# Patient Record
Sex: Male | Born: 1935 | Race: White | Hispanic: No | Marital: Married | Smoking: Former smoker
Health system: Southern US, Community
[De-identification: ages and names within clinical notes are randomized; demographics above are authoritative.]

## PROBLEM LIST (undated history)

## (undated) DIAGNOSIS — C4492 Squamous cell carcinoma of skin, unspecified: Secondary | ICD-10-CM

## (undated) DIAGNOSIS — K219 Gastro-esophageal reflux disease without esophagitis: Secondary | ICD-10-CM

## (undated) DIAGNOSIS — E785 Hyperlipidemia, unspecified: Secondary | ICD-10-CM

## (undated) DIAGNOSIS — Z5181 Encounter for therapeutic drug level monitoring: Secondary | ICD-10-CM

## (undated) DIAGNOSIS — M1710 Unilateral primary osteoarthritis, unspecified knee: Secondary | ICD-10-CM

## (undated) DIAGNOSIS — Z8739 Personal history of other diseases of the musculoskeletal system and connective tissue: Secondary | ICD-10-CM

## (undated) DIAGNOSIS — E669 Obesity, unspecified: Secondary | ICD-10-CM

## (undated) DIAGNOSIS — I4819 Other persistent atrial fibrillation: Secondary | ICD-10-CM

## (undated) DIAGNOSIS — I77810 Thoracic aortic ectasia: Secondary | ICD-10-CM

## (undated) DIAGNOSIS — G4733 Obstructive sleep apnea (adult) (pediatric): Secondary | ICD-10-CM

## (undated) DIAGNOSIS — I2510 Atherosclerotic heart disease of native coronary artery without angina pectoris: Secondary | ICD-10-CM

## (undated) DIAGNOSIS — C61 Malignant neoplasm of prostate: Secondary | ICD-10-CM

## (undated) DIAGNOSIS — M179 Osteoarthritis of knee, unspecified: Secondary | ICD-10-CM

## (undated) DIAGNOSIS — I10 Essential (primary) hypertension: Secondary | ICD-10-CM

## (undated) DIAGNOSIS — I350 Nonrheumatic aortic (valve) stenosis: Secondary | ICD-10-CM

## (undated) DIAGNOSIS — Z79899 Other long term (current) drug therapy: Secondary | ICD-10-CM

## (undated) DIAGNOSIS — C4491 Basal cell carcinoma of skin, unspecified: Secondary | ICD-10-CM

## (undated) HISTORY — DX: Hyperlipidemia, unspecified: E78.5

## (undated) HISTORY — DX: Atherosclerotic heart disease of native coronary artery without angina pectoris: I25.10

## (undated) HISTORY — PX: CORONARY ANGIOPLASTY: SHX604

## (undated) HISTORY — DX: Gastro-esophageal reflux disease without esophagitis: K21.9

## (undated) HISTORY — DX: Thoracic aortic ectasia: I77.810

## (undated) HISTORY — DX: Malignant neoplasm of prostate: C61

## (undated) HISTORY — DX: Nonrheumatic aortic (valve) stenosis: I35.0

## (undated) HISTORY — DX: Osteoarthritis of knee, unspecified: M17.9

## (undated) HISTORY — PX: JOINT REPLACEMENT: SHX530

## (undated) HISTORY — DX: Obstructive sleep apnea (adult) (pediatric): G47.33

## (undated) HISTORY — DX: Essential (primary) hypertension: I10

## (undated) HISTORY — DX: Obesity, unspecified: E66.9

## (undated) HISTORY — DX: Personal history of other diseases of the musculoskeletal system and connective tissue: Z87.39

## (undated) HISTORY — PX: CARDIAC CATHETERIZATION: SHX172

## (undated) HISTORY — DX: Unilateral primary osteoarthritis, unspecified knee: M17.10

---
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
## 1997-11-06 ENCOUNTER — Emergency Department (HOSPITAL_COMMUNITY): Admission: EM | Admit: 1997-11-06 | Discharge: 1997-11-06 | Payer: Self-pay | Admitting: Emergency Medicine

## 1997-11-06 ENCOUNTER — Encounter: Payer: Self-pay | Admitting: Emergency Medicine

## 2001-05-19 DIAGNOSIS — C4491 Basal cell carcinoma of skin, unspecified: Secondary | ICD-10-CM

## 2001-05-19 HISTORY — DX: Basal cell carcinoma of skin, unspecified: C44.91

## 2001-07-03 ENCOUNTER — Ambulatory Visit (HOSPITAL_COMMUNITY): Admission: RE | Admit: 2001-07-03 | Discharge: 2001-07-04 | Payer: Self-pay | Admitting: Cardiology

## 2002-04-22 ENCOUNTER — Ambulatory Visit (HOSPITAL_COMMUNITY): Admission: RE | Admit: 2002-04-22 | Discharge: 2002-04-22 | Payer: Self-pay | Admitting: Gastroenterology

## 2002-10-11 ENCOUNTER — Encounter: Admission: RE | Admit: 2002-10-11 | Discharge: 2002-10-11 | Payer: Self-pay | Admitting: Internal Medicine

## 2002-10-11 ENCOUNTER — Encounter: Payer: Self-pay | Admitting: Internal Medicine

## 2003-03-09 ENCOUNTER — Ambulatory Visit (HOSPITAL_COMMUNITY): Admission: RE | Admit: 2003-03-09 | Discharge: 2003-03-10 | Payer: Self-pay | Admitting: Cardiology

## 2003-03-18 ENCOUNTER — Encounter (HOSPITAL_COMMUNITY): Admission: RE | Admit: 2003-03-18 | Discharge: 2003-06-16 | Payer: Self-pay | Admitting: Cardiology

## 2003-03-21 ENCOUNTER — Encounter: Admission: RE | Admit: 2003-03-21 | Discharge: 2003-03-21 | Payer: Self-pay | Admitting: Internal Medicine

## 2003-06-02 ENCOUNTER — Inpatient Hospital Stay (HOSPITAL_COMMUNITY): Admission: EM | Admit: 2003-06-02 | Discharge: 2003-06-06 | Payer: Self-pay | Admitting: Emergency Medicine

## 2003-06-17 ENCOUNTER — Encounter (HOSPITAL_COMMUNITY): Admission: RE | Admit: 2003-06-17 | Discharge: 2003-09-15 | Payer: Self-pay | Admitting: Cardiology

## 2004-06-01 ENCOUNTER — Ambulatory Visit: Payer: Self-pay | Admitting: Family Medicine

## 2004-08-24 ENCOUNTER — Ambulatory Visit: Payer: Self-pay | Admitting: Internal Medicine

## 2004-11-15 ENCOUNTER — Ambulatory Visit: Payer: Self-pay | Admitting: Family Medicine

## 2004-11-18 ENCOUNTER — Emergency Department (HOSPITAL_COMMUNITY): Admission: EM | Admit: 2004-11-18 | Discharge: 2004-11-18 | Payer: Self-pay | Admitting: *Deleted

## 2004-11-19 ENCOUNTER — Ambulatory Visit: Admission: RE | Admit: 2004-11-19 | Discharge: 2004-12-03 | Payer: Self-pay | Admitting: Radiation Oncology

## 2004-12-24 ENCOUNTER — Ambulatory Visit: Payer: Self-pay | Admitting: Family Medicine

## 2005-01-03 ENCOUNTER — Ambulatory Visit: Admission: RE | Admit: 2005-01-03 | Discharge: 2005-04-03 | Payer: Self-pay | Admitting: Urology

## 2005-01-03 ENCOUNTER — Encounter: Admission: RE | Admit: 2005-01-03 | Discharge: 2005-01-03 | Payer: Self-pay | Admitting: Urology

## 2005-02-18 ENCOUNTER — Ambulatory Visit (HOSPITAL_BASED_OUTPATIENT_CLINIC_OR_DEPARTMENT_OTHER): Admission: RE | Admit: 2005-02-18 | Discharge: 2005-02-18 | Payer: Self-pay | Admitting: Urology

## 2005-03-14 ENCOUNTER — Encounter: Payer: Self-pay | Admitting: Internal Medicine

## 2005-06-18 ENCOUNTER — Encounter: Payer: Self-pay | Admitting: Internal Medicine

## 2005-08-10 ENCOUNTER — Encounter: Admission: RE | Admit: 2005-08-10 | Discharge: 2005-08-10 | Payer: Self-pay | Admitting: Family Medicine

## 2006-01-02 ENCOUNTER — Ambulatory Visit: Payer: Self-pay | Admitting: Internal Medicine

## 2006-10-27 DIAGNOSIS — Z8546 Personal history of malignant neoplasm of prostate: Secondary | ICD-10-CM | POA: Insufficient documentation

## 2006-10-27 DIAGNOSIS — R51 Headache: Secondary | ICD-10-CM | POA: Insufficient documentation

## 2006-10-27 DIAGNOSIS — R519 Headache, unspecified: Secondary | ICD-10-CM | POA: Insufficient documentation

## 2008-06-22 DIAGNOSIS — C4492 Squamous cell carcinoma of skin, unspecified: Secondary | ICD-10-CM

## 2008-06-22 HISTORY — DX: Squamous cell carcinoma of skin, unspecified: C44.92

## 2008-07-20 ENCOUNTER — Emergency Department (HOSPITAL_BASED_OUTPATIENT_CLINIC_OR_DEPARTMENT_OTHER): Admission: EM | Admit: 2008-07-20 | Discharge: 2008-07-20 | Payer: Self-pay | Admitting: Emergency Medicine

## 2008-07-20 ENCOUNTER — Ambulatory Visit: Payer: Self-pay | Admitting: Diagnostic Radiology

## 2008-11-22 ENCOUNTER — Encounter: Payer: Self-pay | Admitting: Internal Medicine

## 2008-12-21 ENCOUNTER — Ambulatory Visit: Payer: Self-pay | Admitting: Internal Medicine

## 2008-12-28 ENCOUNTER — Telehealth: Payer: Self-pay | Admitting: Internal Medicine

## 2009-01-16 ENCOUNTER — Ambulatory Visit: Payer: Self-pay | Admitting: Internal Medicine

## 2009-01-16 DIAGNOSIS — I2510 Atherosclerotic heart disease of native coronary artery without angina pectoris: Secondary | ICD-10-CM | POA: Insufficient documentation

## 2009-01-25 ENCOUNTER — Telehealth: Payer: Self-pay | Admitting: Internal Medicine

## 2009-02-20 ENCOUNTER — Inpatient Hospital Stay (HOSPITAL_COMMUNITY): Admission: RE | Admit: 2009-02-20 | Discharge: 2009-02-23 | Payer: Self-pay | Admitting: Orthopedic Surgery

## 2009-02-22 ENCOUNTER — Ambulatory Visit: Payer: Self-pay | Admitting: Oncology

## 2009-02-24 ENCOUNTER — Ambulatory Visit: Payer: Self-pay | Admitting: Oncology

## 2009-03-01 LAB — CBC WITH DIFFERENTIAL (CANCER CENTER ONLY)
BASO#: 0 10*3/uL (ref 0.0–0.2)
BASO%: 0.5 % (ref 0.0–2.0)
EOS%: 4.1 % (ref 0.0–7.0)
Eosinophils Absolute: 0.3 10*3/uL (ref 0.0–0.5)
HCT: 31.1 % — ABNORMAL LOW (ref 38.7–49.9)
HGB: 10.4 g/dL — ABNORMAL LOW (ref 13.0–17.1)
LYMPH#: 1.5 10*3/uL (ref 0.9–3.3)
LYMPH%: 20.8 % (ref 14.0–48.0)
MCH: 31 pg (ref 28.0–33.4)
MCHC: 33.5 g/dL (ref 32.0–35.9)
MCV: 93 fL (ref 82–98)
MONO#: 0.7 10*3/uL (ref 0.1–0.9)
MONO%: 9.8 % (ref 0.0–13.0)
NEUT#: 4.8 10*3/uL (ref 1.5–6.5)
NEUT%: 64.8 % (ref 40.0–80.0)
Platelets: 395 10*3/uL (ref 145–400)
RBC: 3.36 10*6/uL — ABNORMAL LOW (ref 4.20–5.70)
RDW: 12.3 % (ref 10.5–14.6)
WBC: 7.4 10*3/uL (ref 4.0–10.0)

## 2009-03-01 LAB — CMP (CANCER CENTER ONLY)
ALT(SGPT): 65 U/L — ABNORMAL HIGH (ref 10–47)
AST: 64 U/L — ABNORMAL HIGH (ref 11–38)
Albumin: 3.1 g/dL — ABNORMAL LOW (ref 3.3–5.5)
Alkaline Phosphatase: 82 U/L (ref 26–84)
BUN, Bld: 16 mg/dL (ref 7–22)
CO2: 27 mEq/L (ref 18–33)
Calcium: 9.1 mg/dL (ref 8.0–10.3)
Chloride: 100 mEq/L (ref 98–108)
Creat: 1.1 mg/dl (ref 0.6–1.2)
Glucose, Bld: 121 mg/dL — ABNORMAL HIGH (ref 73–118)
Potassium: 4.3 mEq/L (ref 3.3–4.7)
Sodium: 136 mEq/L (ref 128–145)
Total Bilirubin: 1 mg/dl (ref 0.20–1.60)
Total Protein: 7.1 g/dL (ref 6.4–8.1)

## 2009-03-01 LAB — MORPHOLOGY - CHCC SATELLITE: PLT EST ~~LOC~~: ADEQUATE

## 2009-03-01 LAB — PROTIME-INR (CHCC SATELLITE)
INR: 1.1 — ABNORMAL LOW (ref 2.0–3.5)
Protime: 13.2 Seconds (ref 10.6–13.4)

## 2009-04-25 ENCOUNTER — Ambulatory Visit: Payer: Self-pay | Admitting: Oncology

## 2009-07-04 ENCOUNTER — Ambulatory Visit (HOSPITAL_BASED_OUTPATIENT_CLINIC_OR_DEPARTMENT_OTHER): Admission: RE | Admit: 2009-07-04 | Discharge: 2009-07-04 | Payer: Self-pay | Admitting: Orthopedic Surgery

## 2009-11-21 ENCOUNTER — Encounter: Admission: RE | Admit: 2009-11-21 | Discharge: 2009-11-21 | Payer: Self-pay | Admitting: Internal Medicine

## 2010-02-17 ENCOUNTER — Encounter: Payer: Self-pay | Admitting: Internal Medicine

## 2010-02-27 NOTE — Letter (Signed)
Summary: [facility]  [facility]   Imported By: Sherian Rein 12/06/2008 14:15:39  _____________________________________________________________________  External Attachment:    Type:   Image     Comment:   External Document

## 2010-02-27 NOTE — Miscellaneous (Signed)
Summary: Orders Update pft charges  Clinical Lists Changes  Orders: Added new Service order of Carbon Monoxide diffusing w/capacity (94720) - Signed Added new Service order of Lung Volumes (94240) - Signed Added new Service order of Spirometry (Pre & Post) (94060) - Signed 

## 2010-02-27 NOTE — Assessment & Plan Note (Signed)
Summary: SIX MIN WALK-PULM STRESS TEST  Nurse Visit   Vital Signs:  Patient profile:   77 year old male Pulse rate:   52 / minute BP sitting:   136 / 70  Medications Prior to Update: 1)  Zetia 10 Mg  Tabs (Ezetimibe) .... Once Daily 2)  Bayer Aspirin 325 Mg  Tabs (Aspirin) .... Once Daily 3)  Metoprolol Succinate 25 Mg  Tb24 (Metoprolol Succinate) .... Two Times A Day 4)  Crestor 5 Mg Tabs (Rosuvastatin Calcium) .... Once Daily 5)  Ambien 10 Mg  Tabs (Zolpidem Tartrate) .... As Needed 6)  Flomax 0.4 Mg Caps (Tamsulosin Hcl) .... Once Daily 7)  Multivitamins  Tabs (Multiple Vitamin) .... Once Daily 8)  Fish Oil 1000 Mg Caps (Omega-3 Fatty Acids) .... Once Daily 9)  Glucosamine Hcl 1000 Mg Tabs (Glucosamine Hcl) .... Once Daily 10)  Cpap 9-10 [facility]  Allergies: 1)  ! Prednisone  Orders Added: 1)  Pulmonary Stress (6 min walk) [94620]   Six Minute Walk Test Medications taken before test(dose and time): 1)  Zetia 10 Mg  Tabs (Ezetimibe) .... Once Daily 2)  Bayer Aspirin 325 Mg  Tabs (Aspirin) .... Once Daily 3)  Metoprolol Succinate 25 Mg  Tb24 (Metoprolol Succinate) .... Two Times A Day 4)  Crestor 5 Mg Tabs (Rosuvastatin Calcium) .... Once Daily 6)  Flomax 0.4 Mg Caps (Tamsulosin Hcl) .... Once Daily 7)  Multivitamins  Tabs (Multiple Vitamin) .... Once Daily 8)  Fish Oil 1000 Mg Caps (Omega-3 Fatty Acids) .... Once Daily 9)  Glucosamine Hcl 1000 Mg Tabs (Glucosamine Hcl) .... Once Daily  Supplemental oxygen during the test: No  Lap counter(place a tick mark inside a square for each lap completed) lap 1 complete  lap 2 complete   lap 3 complete   lap 4 complete  lap 5 complete  lap 6 complete  lap 7 complete    Baseline  BP sitting: 136/ 70 Heart rate: 52 Dyspnea ( Borg scale) 0 Fatigue (Borg scale) 0 SPO2 96  End Of Test  BP sitting: 140/ 72 Heart rate: 61 Dyspnea ( Borg scale) 0 Fatigue (Borg scale) 0 SPO2 98  2 Minutes post  BP sitting:  138/ 70 Heart rate: 56 SPO2 97  Stopped or paused before six minutes? No Other symptoms at end of exercise: Leg pain  Interpretation: Number of laps  7 X 48 meters =   336 meters+ final partial lap: 38 meters =    374 meters   Total distance walked in six minutes: 374 meters  Tech ID: Tivis Ringer (January 16, 2009 10:38 AM) Jeremy Johann Comments pt completed test w/ 0 rest breaks and 1 complaint: knee pain (both legs). pt states this is normal for him while walking.

## 2010-02-27 NOTE — Assessment & Plan Note (Signed)
Summary: pulm w/u- low 02 stats/apc   Copy to:  Dr.  Thurston Hole Primary Provider/Referring Provider:  Dr. Kirby Funk  CC:  Pulmonary Consult.  Surgery clearance for left knee replacement.  .  History of Present Illness: 01/02/06: PROBLEM:  77 year old gentleman, self referred, to establish for sleep medicine management, because of sleep apnea.   HISTORY:  He has had sleep apnea for over 15 years. More recently, he had begun having headaches and there was consideration these might be due to inadequate control of his sleep apnea. He had a diagnostic NPSG at [facility], which recorded an AHI of 6.98 per hour, which is minimal (normal range is 0-5 per hour). Oxygen desaturation to 79%, with loud snoring. He then had CPAP titration on 06/18/2005, and has been using CPAP since then. Titration was to 5 CWP, but the [facility] technician taught him to adjust the machine himself, which is ordinarily contraindicated. He has reset it to 7, and he says it works great. Headaches are gone, and he says his wife is back sleeping in the same bedroom with him, now that his snoring is better controlled. He feels he sleeps well, rarely needing supplemental fragment of an Ambien tablet to return to sleep if he wakes during the night. Bedtime is between 10- 11pm, estimated 5 minute sleep latency and waking briefly 2-3 times during the night, before final waking at 6am.   December 21, 2008- Hx OSA Now 76 yoM last here in 2007 qnd referred back by Dr Thurston Hole for preop assessment.  OSA- Does well, fully compliant with CPAP 9-10/ [facility]. He would like to come back here for this problem. Uses all night every night and it helps. NEW PROBLEM /REFERRAL: Preop pulmonary evaluation because he found his O2 sat at times running lower than he is used to. Not aware of lung disease. Goes to gym where sat has been 98% exercise. Was 93-94% at Dr Sherene Sires and they suggested check. He denies change in symptoms, cough,  wheeze, palpitation, chest pain, leg edema or obvious dyspnea. Had flu vax.   Current Medications (verified): 1)  Zetia 10 Mg  Tabs (Ezetimibe) .... Once Daily 2)  Bayer Aspirin 325 Mg  Tabs (Aspirin) .... Once Daily 3)  Metoprolol Succinate 25 Mg  Tb24 (Metoprolol Succinate) .... Two Times A Day 4)  Crestor 5 Mg Tabs (Rosuvastatin Calcium) .... Once Daily 5)  Ambien 10 Mg  Tabs (Zolpidem Tartrate) .... As Needed 6)  Flomax 0.4 Mg Caps (Tamsulosin Hcl) .... Once Daily 7)  Multivitamins  Tabs (Multiple Vitamin) .... Once Daily 8)  Fish Oil 1000 Mg Caps (Omega-3 Fatty Acids) .... Once Daily 9)  Glucosamine Hcl 1000 Mg Tabs (Glucosamine Hcl) .... Once Daily  Allergies (verified): 1)  ! Prednisone  Past History:  Family History: Last updated: 12/21/2008 Family History of PVD Family History of CAD Male 1st degree relative <50 Family History Diabetes 1st degree relative Family History Osteoporosis Family History Other cancer Family History of Cardiovascular disorder PVD-father  Social History: Last updated: 12/21/2008 Retired from [employer] Married Former Smoker. Quit in 1960's.  1ppd x61yrs.   Alcohol use-yes 2 children  Risk Factors: Smoking Status: quit (10/27/2006)  Past Medical History:  Obstrucitive SLEEP APNEA (ICD-780.57) PROSTATE CANCER, HX OF (ICD-V10.46) FAMILY HISTORY OSTEOPOROSIS (ICD-V17.8) FAMILY HISTORY DIABETES 1ST DEGREE RELATIVE (ICD-V18.0) FAMILY HISTORY OF CAD MALE 1ST DEGREE RELATIVE <50 (ICD-V17.3) HEADACHE (ICD-784.0) Hx arrhythmia- Dr Mayford Knife  Past Surgical History: Hand Surgery Cordis stent  06/2001 Taxus stent 03/2003 Radioactive Seed implant for prostate ca 01/2005  Family History: Family History of PVD Family History of CAD Male 1st degree relative <50 Family History Diabetes 1st degree relative Family History Osteoporosis Family History Other cancer Family History of Cardiovascular disorder PVD-father  Social  History: Retired from [employer] Married Former Smoker. Quit in 1960's.  1ppd x16yrs.   Alcohol use-yes 2 children  Review of Systems      See HPI       The patient complains of irregular heartbeats and acid heartburn.  The patient denies shortness of breath with activity, shortness of breath at rest, productive cough, non-productive cough, coughing up blood, chest pain, indigestion, loss of appetite, weight change, abdominal pain, difficulty swallowing, sore throat, tooth/dental problems, headaches, nasal congestion/difficulty breathing through nose, sneezing, itching, ear ache, anxiety, depression, hand/feet swelling, joint stiffness or pain, rash, change in color of mucus, and fever.    Vital Signs:  Patient profile:   77 year old male Height:      74 inches Weight:      249 pounds BMI:     32.09 O2 Sat:      92 % on Room air Pulse rate:   56 / minute BP sitting:   140 / 72  (right arm) Cuff size:   large  Vitals Entered By: Gweneth Dimitri RN (December 21, 2008 10:25 AM)  O2 Flow:  Room air CC: Pulmonary Consult.  Surgery clearance for left knee replacement.   Comments Medications reviewed with patient Gweneth Dimitri RN  December 21, 2008 10:26 AM    Physical Exam  Additional Exam:  General: A/Ox3; pleasant and cooperative, NAD, alert/ bright, medium build SKIN: no rash, lesions NODES: no lymphadenopathy HEENT: [place]/AT, EOM- WNL, Conjuctivae- clear, PERRLA, TM-WNL, Nose- clear, Throat- clear and wnl, [place]  II-III NECK: Supple w/ fair ROM, JVD- none, normal carotid impulses w/o bruits Thyroid- normal to palpation CHEST: Clear to P&A, unlabored, no rales, wheeze, dullness or cough HEART: RRR- occasional dropped beat, no m/g/r heard ABDOMEN: Soft and nl; nml bowel sounds; no organomegaly or masses noted ZOX:WRUE, nl pulses, no edema, cyanosis or clubbing NEURO: Grossly intact to observation      Impression & Recommendations:  Problem # 1:   DYSPNEA (ICD-786.05)  I think he is in normal range and I don't anticipate any pulmonary problem with surgery. For completeness due to his pending surgery, I did suggest a PFT.  Problem # 2:  SLEEP APNEA (ICD-780.57) I had seen him originally for sleep apnea. He has had excellent compliance and control with cpap. He asks I resume following for this problem. I will find his old sleep study.  Medications Added to Medication List This Visit: 1)  Zetia 10 Mg Tabs (Ezetimibe) .... Once daily 2)  Bayer Aspirin 325 Mg Tabs (Aspirin) .... Once daily 3)  Metoprolol Succinate 25 Mg Tb24 (Metoprolol succinate) .... Two times a day 4)  Crestor 5 Mg Tabs (Rosuvastatin calcium) .... Once daily 5)  Ambien 10 Mg Tabs (Zolpidem tartrate) .... As needed 6)  Flomax 0.4 Mg Caps (Tamsulosin hcl) .... Once daily 7)  Multivitamins Tabs (Multiple vitamin) .... Once daily 8)  Fish Oil 1000 Mg Caps (Omega-3 fatty acids) .... Once daily 9)  Glucosamine Hcl 1000 Mg Tabs (Glucosamine hcl) .... Once daily 10)  Cpap 9-10 [facility]   Other Orders: Consultation Level III (45409)  Patient Instructions: 1)  Please schedule a follow-up appointment in 3 weeks.  2)  Schedule PFT 3)  Schedule 6 MWT

## 2010-02-27 NOTE — Progress Notes (Signed)
Summary: sleep study needed  Phone Note Other Incoming   Summary of Call: Get original NPSG and current cpap pressure. Initial call taken by: cyoug  Follow-up for Phone Call        [facility] and they have requested original sleep study from 2007 from [facility] and should have it by Wed. 01/04/09. They will fax to [phone].  Called [facility] ([place]) and they state that pt is on a pressure of 5 cwp. Pt states that pressure is on 10 cwp. Dr. Maple Hudson advised. Alfonso Ramus  January 02, 2009 2:51 PM   Additional Follow-up for Phone Call Additional follow up Details #1::        We will do best to retitrate if there is that much discrepancy.  Additional Follow-up by: Waymon Budge MD,  January 02, 2009 8:14 PM    Additional Follow-up for Phone Call Additional follow up Details #2::    Order faxed to [facility] in [place]. Pt is aware. Original Sleep Study obtained from [facility] and given to Dr. Maple Hudson. Rhonda Cobb  January 04, 2009 12:02 PM

## 2010-02-27 NOTE — Assessment & Plan Note (Signed)
Summary: 3 weeks/apc   Copy to:  Dr.  Thurston Hole Primary Provider/Referring Provider:  Dr. Kirby Funk  CC:  follow up visit-reveiw PFT and .Marland Kitchen  History of Present Illness: December 21, 2008- Hx OSA Now 41 yoM last here in 2007 qnd referred back by Dr Thurston Hole for preop assessment.  OSA- Does well, fully compliant with CPAP 9-10/ [facility]. He would like to come back here for this problem. Uses all night every night and it helps. NEW PROBLEM /REFERRAL: Preop pulmonary evaluation because he found his O2 sat at times running lower than he is used to. Not aware of lung disease. Goes to gym where sat has been 98% exercise. Was 93-94% at Dr Sherene Sires and they suggested check. He denies change in symptoms, cough, wheeze, palpitation, chest pain, leg edema or obvious dyspnea. Had flu vax.  January 16, 2009-, Hypoxia/ preop  Here for PFT and review, after dropping sauration a little during exercise at orthopedic office. No changes since last here. Mentions occasional sniffle "from allergy"; takes Claritin if needed. PFT- mild reversible small airway obstruction/ asthma pattern. - 96%, 98%, 97%, .  OSA- [facility] has him doing autotitration for pressure check as planned, with result pending. Had flu shot.   Current Medications (verified): 1)  Zetia 10 Mg  Tabs (Ezetimibe) .... Once Daily 2)  Bayer Aspirin 325 Mg  Tabs (Aspirin) .... Once Daily 3)  Metoprolol Succinate 25 Mg  Tb24 (Metoprolol Succinate) .... Two Times A Day 4)  Crestor 5 Mg Tabs (Rosuvastatin Calcium) .... Once Daily 5)  Ambien 10 Mg  Tabs (Zolpidem Tartrate) .... As Needed 6)  Flomax 0.4 Mg Caps (Tamsulosin Hcl) .... Once Daily 7)  Multivitamins  Tabs (Multiple Vitamin) .... Once Daily 8)  Fish Oil 1000 Mg Caps (Omega-3 Fatty Acids) .... Once Daily 9)  Cpap 9-10 [facility]  Allergies (verified): 1)  ! Prednisone  Past History:  Past Medical History: Last updated: 12/21/2008  Obstrucitive SLEEP  APNEA (ICD-780.57) PROSTATE CANCER, HX OF (ICD-V10.46) FAMILY HISTORY OSTEOPOROSIS (ICD-V17.8) FAMILY HISTORY DIABETES 1ST DEGREE RELATIVE (ICD-V18.0) FAMILY HISTORY OF CAD MALE 1ST DEGREE RELATIVE <50 (ICD-V17.3) HEADACHE (ICD-784.0) Hx arrhythmia- Dr Mayford Knife  Past Surgical History: Last updated: 12/21/2008 Hand Surgery Cordis stent 06/2001 Taxus stent 03/2003 Radioactive Seed implant for prostate ca 01/2005  Family History: Last updated: 12/21/2008 Family History of PVD Family History of CAD Male 1st degree relative <50 Family History Diabetes 1st degree relative Family History Osteoporosis Family History Other cancer Family History of Cardiovascular disorder PVD-father  Social History: Last updated: 12/21/2008 Retired from [employer] Married Former Smoker. Quit in 1960's.  1ppd x75yrs.   Alcohol use-yes 2 children  Risk Factors: Smoking Status: quit (10/27/2006)  Review of Systems      See HPI  The patient denies anorexia, fever, weight loss, weight gain, vision loss, decreased hearing, hoarseness, chest pain, syncope, dyspnea on exertion, peripheral edema, prolonged cough, headaches, hemoptysis, and severe indigestion/heartburn.    Vital Signs:  Patient profile:   77 year old male Height:      74 inches Weight:      242 pounds BMI:     31.18 O2 Sat:      90 % on Room air Pulse rate:   54 / minute BP sitting:   124 / 62  (left arm) Cuff size:   large  Vitals Entered By: Reynaldo Minium CMA (January 16, 2009 10:44 AM)  O2 Flow:  Room air  Physical Exam  Additional Exam:  General: A/Ox3; pleasant and cooperative, NAD, alert/ bright, medium build SKIN: no rash, lesions NODES: no lymphadenopathy HEENT: [place]/AT, EOM- WNL, Conjuctivae- clear, PERRLA, TM-WNL, Nose- clear, Throat- clear and wnl, [place]  II-III NECK: Supple w/ fair ROM, JVD- none, normal carotid impulses w/o bruits Thyroid-  CHEST: Clear to P&A, unlabored, no rales, wheeze,  dullness or cough HEART: RRR- occasional dropped beat, no m/g/r heard ABDOMEN: Soft and nl;  ZOX:WRUE, nl pulses, no edema, cyanosis or clubbing NEURO: Grossly intact to observation      Impression & Recommendations:  Problem # 1:  DYSPNEA (ICD-786.05)  Oxygenation on 6 MWT is quite normal. PFT indicates mild reversible asthma pattern which is never symptomatic. He wouldn't see any  benefit from a bronchodilator now and I think it is reasonable to hold off. He should do well with his TKR surgery. Possibly he would need temporary oxygen or an albuterol neb post-op, but i don't anticipate problems.  Problem # 2:  SLEEP APNEA (ICD-780.57)  We await results of his autotitration for pressure reconsideration, otherwise he is doing well. He will need to wear cpap post op while sleeping or sedated.  Problem # 3:  CAD (ICD-414.00)  Asy mptomatic, with hx of stents- Dr Mayford Knife His updated medication list for this problem includes:    Bayer Aspirin 325 Mg Tabs (Aspirin) ..... Once daily    Metoprolol Succinate 25 Mg Tb24 (Metoprolol succinate) .Marland Kitchen..Marland Kitchen Two times a day  Orders: Est. Patient Level III (45409)  Patient Instructions: 1)  Schedule return in one year, earlier if needed 2)  You are "clear" for necessary surgery.  3)  When your download result comes in, I will review it and suggest a pressure change only if there is a meaningful difference from what you are currently set on. If we make a change you don't like, let us know.

## 2010-02-27 NOTE — Progress Notes (Signed)
Summary: CPAP titration good at 9 cwp  Phone Note Other Incoming   Summary of Call: CPAP autotitration good compliance and control 9 cwp, AHI 1.3/hr, used 6-7 hrs/ night Initial call taken by: Waymon Budge MD,  January 25, 2009 3:03 PM

## 2010-04-15 LAB — CBC
HCT: 26.1 % — ABNORMAL LOW (ref 39.0–52.0)
HCT: 26.7 % — ABNORMAL LOW (ref 39.0–52.0)
HCT: 29.5 % — ABNORMAL LOW (ref 39.0–52.0)
HCT: 35.9 % — ABNORMAL LOW (ref 39.0–52.0)
HCT: 44.5 % (ref 39.0–52.0)
Hemoglobin: 12.1 g/dL — ABNORMAL LOW (ref 13.0–17.0)
Hemoglobin: 15.3 g/dL (ref 13.0–17.0)
Hemoglobin: 9 g/dL — ABNORMAL LOW (ref 13.0–17.0)
Hemoglobin: 9.2 g/dL — ABNORMAL LOW (ref 13.0–17.0)
Hemoglobin: 9.8 g/dL — ABNORMAL LOW (ref 13.0–17.0)
MCHC: 33.3 g/dL (ref 30.0–36.0)
MCHC: 33.6 g/dL (ref 30.0–36.0)
MCHC: 34.5 g/dL (ref 30.0–36.0)
MCHC: 34.5 g/dL (ref 30.0–36.0)
MCHC: 34.6 g/dL (ref 30.0–36.0)
MCV: 93.5 fL (ref 78.0–100.0)
MCV: 93.9 fL (ref 78.0–100.0)
MCV: 94.1 fL (ref 78.0–100.0)
MCV: 94.2 fL (ref 78.0–100.0)
MCV: 95.2 fL (ref 78.0–100.0)
Platelets: 55 10*3/uL — ABNORMAL LOW (ref 150–400)
Platelets: 70 10*3/uL — ABNORMAL LOW (ref 150–400)
Platelets: 71 10*3/uL — ABNORMAL LOW (ref 150–400)
Platelets: 78 10*3/uL — ABNORMAL LOW (ref 150–400)
Platelets: 82 10*3/uL — ABNORMAL LOW (ref 150–400)
RBC: 2.77 MIL/uL — ABNORMAL LOW (ref 4.22–5.81)
RBC: 2.85 MIL/uL — ABNORMAL LOW (ref 4.22–5.81)
RBC: 3.13 MIL/uL — ABNORMAL LOW (ref 4.22–5.81)
RBC: 3.77 MIL/uL — ABNORMAL LOW (ref 4.22–5.81)
RBC: 4.76 MIL/uL (ref 4.22–5.81)
RDW: 13.5 % (ref 11.5–15.5)
RDW: 13.6 % (ref 11.5–15.5)
RDW: 13.8 % (ref 11.5–15.5)
RDW: 13.9 % (ref 11.5–15.5)
RDW: 13.9 % (ref 11.5–15.5)
WBC: 10 10*3/uL (ref 4.0–10.5)
WBC: 6.3 10*3/uL (ref 4.0–10.5)
WBC: 7.1 10*3/uL (ref 4.0–10.5)
WBC: 7.4 10*3/uL (ref 4.0–10.5)
WBC: 9.5 10*3/uL (ref 4.0–10.5)

## 2010-04-15 LAB — BASIC METABOLIC PANEL
BUN: 10 mg/dL (ref 6–23)
BUN: 11 mg/dL (ref 6–23)
BUN: 12 mg/dL (ref 6–23)
CO2: 26 mEq/L (ref 19–32)
CO2: 29 mEq/L (ref 19–32)
CO2: 31 mEq/L (ref 19–32)
Calcium: 8.1 mg/dL — ABNORMAL LOW (ref 8.4–10.5)
Calcium: 8.2 mg/dL — ABNORMAL LOW (ref 8.4–10.5)
Calcium: 8.5 mg/dL (ref 8.4–10.5)
Chloride: 104 mEq/L (ref 96–112)
Chloride: 105 mEq/L (ref 96–112)
Chloride: 105 mEq/L (ref 96–112)
Creatinine, Ser: 0.83 mg/dL (ref 0.4–1.5)
Creatinine, Ser: 0.86 mg/dL (ref 0.4–1.5)
Creatinine, Ser: 0.88 mg/dL (ref 0.4–1.5)
GFR calc Af Amer: >60 mL/min (ref >60)
GFR calc Af Amer: >60 mL/min (ref >60)
GFR calc Af Amer: >60 mL/min (ref >60)
GFR calc non Af Amer: >60 mL/min (ref >60)
GFR calc non Af Amer: >60 mL/min (ref >60)
GFR calc non Af Amer: >60 mL/min (ref >60)
Glucose, Bld: 113 mg/dL — ABNORMAL HIGH (ref 70–99)
Glucose, Bld: 120 mg/dL — ABNORMAL HIGH (ref 70–99)
Glucose, Bld: 122 mg/dL — ABNORMAL HIGH (ref 70–99)
Potassium: 3.9 mEq/L (ref 3.5–5.1)
Potassium: 4.1 mEq/L (ref 3.5–5.1)
Potassium: 4.4 mEq/L (ref 3.5–5.1)
Sodium: 137 mEq/L (ref 135–145)
Sodium: 138 mEq/L (ref 135–145)
Sodium: 140 mEq/L (ref 135–145)

## 2010-04-15 LAB — URINE MICROSCOPIC-ADD ON

## 2010-04-15 LAB — URINALYSIS, ROUTINE W REFLEX MICROSCOPIC
Bilirubin Urine: NEGATIVE
Glucose, UA: NEGATIVE mg/dL
Glucose, UA: NEGATIVE mg/dL
Hgb urine dipstick: NEGATIVE
Ketones, ur: NEGATIVE mg/dL
Ketones, ur: NEGATIVE mg/dL
Nitrite: NEGATIVE
Nitrite: NEGATIVE
Protein, ur: NEGATIVE mg/dL
Protein, ur: NEGATIVE mg/dL
Specific Gravity, Urine: 1.018 (ref 1.005–1.030)
Specific Gravity, Urine: 1.026 (ref 1.005–1.030)
Urobilinogen, UA: 0.2 mg/dL (ref 0.0–1.0)
Urobilinogen, UA: 1 mg/dL (ref 0.0–1.0)
pH: 5.5 (ref 5.0–8.0)
pH: 6 (ref 5.0–8.0)

## 2010-04-15 LAB — URINE CULTURE
Colony Count: 1000
Colony Count: 65000

## 2010-04-15 LAB — ABO/RH: ABO/RH(D): A POS

## 2010-04-15 LAB — DIFFERENTIAL
Basophils Absolute: 0 10*3/uL (ref 0.0–0.1)
Basophils Relative: 0 % (ref 0–1)
Eosinophils Absolute: 0.1 10*3/uL (ref 0.0–0.7)
Eosinophils Relative: 2 % (ref 0–5)
Lymphocytes Relative: 32 % (ref 12–46)
Lymphs Abs: 2 10*3/uL (ref 0.7–4.0)
Monocytes Absolute: 0.6 10*3/uL (ref 0.1–1.0)
Monocytes Relative: 10 % (ref 3–12)
Neutro Abs: 3.5 10*3/uL (ref 1.7–7.7)
Neutrophils Relative %: 56 % (ref 43–77)

## 2010-04-15 LAB — PROTIME-INR
INR: 0.94 (ref 0.00–1.49)
INR: 1.23 (ref 0.00–1.49)
INR: 3.16 — ABNORMAL HIGH (ref 0.00–1.49)
INR: 3.33 — ABNORMAL HIGH (ref 0.00–1.49)
Prothrombin Time: 12.5 seconds (ref 11.6–15.2)
Prothrombin Time: 15.4 seconds — ABNORMAL HIGH (ref 11.6–15.2)
Prothrombin Time: 32.2 seconds — ABNORMAL HIGH (ref 11.6–15.2)
Prothrombin Time: 33.5 seconds — ABNORMAL HIGH (ref 11.6–15.2)

## 2010-04-15 LAB — HEPARIN INDUCED THROMBOCYTOPENIA PNL
Heparin Induced Plt Ab: NEGATIVE
Patient O.D.: 0.217
Serotonin Release: 10 % release (ref <20)

## 2010-04-15 LAB — DIC (DISSEMINATED INTRAVASCULAR COAGULATION)PANEL
D-Dimer, Quant: 2.34 ug/mL-FEU — ABNORMAL HIGH (ref 0.00–0.48)
Fibrinogen: 621 mg/dL — ABNORMAL HIGH (ref 204–475)
INR: 3.88 — ABNORMAL HIGH (ref 0.00–1.49)
Platelets: 63 10*3/uL — ABNORMAL LOW (ref 150–400)
Prothrombin Time: 37.8 seconds — ABNORMAL HIGH (ref 11.6–15.2)
Smear Review: NONE SEEN
aPTT: 55 seconds — ABNORMAL HIGH (ref 24–37)

## 2010-04-15 LAB — COMPREHENSIVE METABOLIC PANEL
ALT: 22 U/L (ref 0–53)
AST: 24 U/L (ref 0–37)
Albumin: 3.9 g/dL (ref 3.5–5.2)
Alkaline Phosphatase: 92 U/L (ref 39–117)
BUN: 17 mg/dL (ref 6–23)
CO2: 28 mEq/L (ref 19–32)
Calcium: 9.3 mg/dL (ref 8.4–10.5)
Chloride: 104 mEq/L (ref 96–112)
Creatinine, Ser: 0.91 mg/dL (ref 0.4–1.5)
GFR calc Af Amer: >60 mL/min (ref >60)
GFR calc non Af Amer: >60 mL/min (ref >60)
Glucose, Bld: 92 mg/dL (ref 70–99)
Potassium: 4.2 mEq/L (ref 3.5–5.1)
Sodium: 140 mEq/L (ref 135–145)
Total Bilirubin: 0.7 mg/dL (ref 0.3–1.2)
Total Protein: 6.8 g/dL (ref 6.0–8.3)

## 2010-04-15 LAB — TYPE AND SCREEN
ABO/RH(D): A POS
Antibody Screen: NEGATIVE

## 2010-04-15 LAB — APTT: aPTT: 26 seconds (ref 24–37)

## 2010-04-15 LAB — SAVE SMEAR

## 2010-04-16 LAB — POCT I-STAT, CHEM 8
BUN: 14 mg/dL (ref 6–23)
Calcium, Ion: 1.22 mmol/L (ref 1.12–1.32)
Chloride: 107 mEq/L (ref 96–112)
Creatinine, Ser: 0.9 mg/dL (ref 0.4–1.5)
Glucose, Bld: 110 mg/dL — ABNORMAL HIGH (ref 70–99)
HCT: 47 % (ref 39.0–52.0)
Hemoglobin: 16 g/dL (ref 13.0–17.0)
Potassium: 4.2 mEq/L (ref 3.5–5.1)
Sodium: 142 mEq/L (ref 135–145)
TCO2: 25 mmol/L (ref 0–100)

## 2010-05-07 LAB — BASIC METABOLIC PANEL
BUN: 17 mg/dL (ref 6–23)
CO2: 26 mEq/L (ref 19–32)
Calcium: 9.6 mg/dL (ref 8.4–10.5)
Chloride: 108 mEq/L (ref 96–112)
Creatinine, Ser: 1 mg/dL (ref 0.4–1.5)
GFR calc Af Amer: >60 mL/min (ref >60)
GFR calc non Af Amer: >60 mL/min (ref >60)
Glucose, Bld: 115 mg/dL — ABNORMAL HIGH (ref 70–99)
Potassium: 4.7 mEq/L (ref 3.5–5.1)
Sodium: 143 mEq/L (ref 135–145)

## 2010-05-07 LAB — URINALYSIS, ROUTINE W REFLEX MICROSCOPIC
Bilirubin Urine: NEGATIVE
Glucose, UA: NEGATIVE mg/dL
Hgb urine dipstick: NEGATIVE
Ketones, ur: NEGATIVE mg/dL
Nitrite: NEGATIVE
Protein, ur: NEGATIVE mg/dL
Specific Gravity, Urine: 1.011 (ref 1.005–1.030)
Urobilinogen, UA: 0.2 mg/dL (ref 0.0–1.0)
pH: 5 (ref 5.0–8.0)

## 2010-05-07 LAB — POCT CARDIAC MARKERS
CKMB, poc: 4.2 ng/mL (ref 1.0–8.0)
CKMB, poc: 5.9 ng/mL (ref 1.0–8.0)
Myoglobin, poc: 141 ng/mL (ref 12–200)
Myoglobin, poc: 170 ng/mL (ref 12–200)
Troponin i, poc: <0.05 ng/mL (ref 0.00–0.09)
Troponin i, poc: <0.05 ng/mL (ref 0.00–0.09)

## 2010-05-07 LAB — CBC
HCT: 44.9 % (ref 39.0–52.0)
Hemoglobin: 15.2 g/dL (ref 13.0–17.0)
MCHC: 33.8 g/dL (ref 30.0–36.0)
MCV: 93.3 fL (ref 78.0–100.0)
Platelets: 142 10*3/uL — ABNORMAL LOW (ref 150–400)
RBC: 4.81 MIL/uL (ref 4.22–5.81)
RDW: 12.9 % (ref 11.5–15.5)
WBC: 6.4 10*3/uL (ref 4.0–10.5)

## 2010-05-07 LAB — DIFFERENTIAL
Basophils Absolute: 0 10*3/uL (ref 0.0–0.1)
Basophils Relative: 1 % (ref 0–1)
Eosinophils Absolute: 0.1 10*3/uL (ref 0.0–0.7)
Eosinophils Relative: 2 % (ref 0–5)
Lymphocytes Relative: 35 % (ref 12–46)
Lymphs Abs: 2.3 10*3/uL (ref 0.7–4.0)
Monocytes Absolute: 0.6 10*3/uL (ref 0.1–1.0)
Monocytes Relative: 9 % (ref 3–12)
Neutro Abs: 3.4 10*3/uL (ref 1.7–7.7)
Neutrophils Relative %: 53 % (ref 43–77)

## 2010-05-07 LAB — POCT B-TYPE NATRIURETIC PEPTIDE (BNP): B Natriuretic Peptide, POC: 49.1 pg/mL (ref 0–100)

## 2010-05-07 LAB — URINE MICROSCOPIC-ADD ON

## 2010-06-15 NOTE — Cardiovascular Report (Signed)
NAME:  Randall Chen, Randall Chen NO.:  [id number]   MEDICAL RECORD NO.:  [id number]                   PATIENT TYPE:  OIB   LOCATION:  2899                                 FACILITY:  [facility]   PHYSICIAN:  Francisca December, M.D.               DATE OF BIRTH:  1932/11/07   DATE OF PROCEDURE:  03/09/2003  DATE OF DISCHARGE:                              CARDIAC CATHETERIZATION   PROCEDURES PERFORMED:  1. Left heart catheterization.  2. Coronary angiography.  3. Left ventriculogram.  4. Percutaneous coronary intervention/drug-eluting stent implantation, first     marginal branch.  5. Intravascular ultrasound, left circumflex coronary artery.  6. Fractional flow reserve, right coronary artery.  7. Right femoral arteriogram.  8. Percutaneous closure, right femoral artery/Angio-Seal.   CARDIOLOGIST:  Francisca December, M.D.   INDICATIONS:  [name] is a 77 year old man with known ASCVD now 75  months status post PTCA and stent, RCA.  He has recently developed a rather  typical anginal syndrome and an NYHA Class 2 pattern.  Exercise stress test  has reproduced angina and there were ischemic electrocardiographic changes.  Myocardial perfusion imaging was unimpressive.  There was a mild reversible  inferobasal defect.  This study is undertaken to evaluate for the extent of  disease and provide for further therapeutic options.   PROCEDURAL NOTE:  The patient was brought to the cardiac catheterization  laboratory in the fasting state.  The right groin was prepped and draped in  the usual sterile fashion.  Local anesthesia was obtained with the  infiltration of 1% lidocaine.  A 5 French catheter sheath was inserted  percutaneously into the right femoral artery utilizing an anterior approach  over a guiding J-wire.  Left heart catheterization was then performed using  a 6 French 100 cm pigtail catheter.  Pressure was recorded with the catheter  in the ascending aorta  and in the left ventricle both prior to and following  the ventriculogram.  A 30-degree RAO cine left ventriculogram was performed  utilizing a power injector.  Following the sublingual administration of 0.4  mg of nitroglycerin cine angiography of the left and right coronary arteries  was conducted utilizing 5 [place] #4 left and right Judkins catheters.  Cine  angiography of each coronary artery was conducted in multiple LAO and RAO  projections.  The images were analyzed and compared with the previous study  of June 2003.  A determination was made that there was progression of  disease and significant obstruction of a large first marginal branch.  I  therefore prepared for percutaneous coronary intervention.   The 5 French catheter sheath was exchanged over a long guiding J-wire for a  6 French catheter sheath.  The patient received 5200 units of heparin  intravenously.  He also received a 20 mcg/kg bolus of Aggrastat  intravenously and then a constant infusion.  The resulting ACT was 225  seconds.  A 6 French #4 CLS guiding catheter was then advanced to the  ascending aorta where the left coronary os was engaged.  A 0.014 inch Sci-  Med Luge intracoronary guidewire was passed across the lesion in the first  marginal branch without difficulty.  The lesion was primarily stented using  a 3.0/20 mm Sci-Med TAXUS intracoronary drug-eluting stent.  This device was  deployed at a peak pressure of 14 atmospheres for approximately 1 minute 10  seconds.  The balloon was deflated and removed.  Intravascular ultrasound  was then performed using a Sci-Med Atlantis SR Pro catheter.  A single  pullback was obtained through the stented segment into the proximal marginal  branch, proximal left circumflex and into the ostium through to the left  main.  The images were briefly analyzed.  The main objective of this study  was to more fully evaluate an ostial left circumflex lesion.  It did have a  greater  than 2 mm diameter.  I therefore withdrew the ultrasound catheter  and wire.  Adequate patency was confirmed in orthogonal views and the  guiding catheter was then removed.   It was exchanged for a 6 [place] #4 right Judkins guiding catheter.  The  right coronary os was engaged.  A Volcano Smart wire was advanced to the tip  of the guiding catheter and pressure was normalized.  The wire was then  advanced into the distal part of the right coronary.  The patient received  36 mcg of adenosine in four separate injections while monitoring the  fractional flow reserve determination.  It did not fall lower then 0.79.  This guidewire was therefore then removed as well as the guiding catheter.   A left femoral arteriogram was then performed via the right femoral artery  sheath.  Ten milliliters of contrast was injected by hand.  This was in an  RAO projection.  It confirmed the common femoral artery to be widely patent  and the arteriotomy site to be well above the bifurcation Integrilin the  profunda femoris and superficial femoral arteries.   The arteriotomy site was then closed using an Angio-Seal collagen plug.  There was good hemostasis and an intact distal pulse at completion.   The patient was transported to the recovery area in stable condition.   HEMODYNAMIC DATA:  Systemic arterial pressure was 112/62 with a mean of 82  mmHg.  There was no systolic gradient across the aortic valve.  The left  ventricular end diastolic pressure was 13 mmHg pre- and post ventriculogram.   As mentioned above the fractional flow reserve determination  in the right  coronary was 0.79 by ratio.   ANGIOGRAPHIC DATA:  Left Ventriculogram:  The left ventriculogram  demonstrated normal chamber size and normal global systolic function.  A  visual estimate of the ejection fraction was 65%.  No regional wall motion abnormalities were noted.  There is an easily visualized stent in the right  coronary and left  coronary calcification.  There is some calcification of  the aortic valve leaflets with reduced excursion of the left and right  cusps.  The posterior cusps seems to be opening normally.  There is no  mitral regurgitation.   There is a right dominant coronary system present.   The main left coronary artery had a distal 20% stenosis.   The left anterior descending artery and its branches was widely patent.  The  vessel does have luminal irregularities and there is a 20-30%  stenosis just  before the origin of a small first diagonal branch.  The second diagonal  branch is large without obstruction.  There is a very small third distal  diagonal branch, again, without significant obstruction.  The anterior  descending artery itself is widely patent, but diffusely mildly  atherosclerotic.  It does reach, but does not traverse the apex.   The left circumflex coronary artery and its branches was highly diseased.  There was the previously mentioned 50% stenosis at the ostium.  The vessel  then gives rise to a large first marginal branch, which is the major vessel  on the lateral wall of the heart.  It has a diffuse 80% proximal stenosis,  which extends over about 5 mm.  The ongoing circumflex gives rise to a  single marginal branch, which is small and remains in the posterobasal  myocardial segment.  No obstruction was seen within the distal circumflex or  this small distal marginal branch.   The right coronary artery and its branches is moderately diseased.  The  vessel contains a long proximal and midvessel stenosis, which is proximal to  the stent.  The proximal stenosis extends over at least 30 mm and is in the  range of 50% stenotic.  The distal vessel is without significant  obstruction.  There are luminal irregularities in the distal right coronary.  It bifurcates into a large posterolateral segment, which gives rise to three  left ventricular branches.  The second is moderate in size.   The first and  third are small-to-moderate.  The posterior descending artery is large and  there is a 50% stenosis in the proximal segment.  This is the vessel that  perfuses the inferior apex of the left ventricle.   Collateral vessels are not seen.   Following balloon dilatation and stent implantation in the first circumflex  marginal there was no residual stenosis an there was good TIMI III distal  flow.   FINAL IMPRESSION:  1. Atherosclerotic coronary vascular disease, two-vessel.  2. Status post successful percutaneous coronary intervention/drug-eluting     stent implantation, first circumflex marginal.  3. Intact left ventricular size and global systolic function; ejection     fraction 65%.  4. Typical angina was reproduced with device insertion and balloon     inflation.  5. Negative fractional flow reserve determination, right coronary artery. 6. Intravascular ultrasound; proximal left circumflex revealed a 2.6 x 1.8     diameter ostium.  The calculated area was 4.0 millimeters squared.                                               Francisca December, M.D.    JHE/MEDQ  D:  03/09/2003  T:  03/10/2003  Job:  [id number]   cc:   Ike Bene, M.D.  [address]  [place]  [place] [address]  Fax: [phone]   Cardiac Catheterization Laboratory

## 2010-06-15 NOTE — H&P (Signed)
NAME:  Randall Chen, Randall Chen NO.:  [id number]   MEDICAL RECORD NO.:  [id number]                   PATIENT TYPE:  INP   LOCATION:  5501                                 FACILITY:  [facility]   PHYSICIAN:  Jimmye Norman III, M.D.               DATE OF BIRTH:  [birth year]   DATE OF ADMISSION:  06/01/2003  DATE OF DISCHARGE:                                HISTORY & PHYSICAL   IDENTIFICATION AND CHIEF COMPLAINT:  The patient is a 77 year old with  abdominal pain and a possible small-bowel obstruction, based on CT findings.   HISTORY OF PRESENT ILLNESS:  The patient presented to the emergency room  approximately 1:30 yesterday afternoon with acute-onset abdominal pain in  the epigastrium and towards the left upper quadrant.  He had some nausea, no  vomiting, and had had a normal bowel movement, as he could tell, just after  his pain started at home.  He came into the emergency room, where a workup  ensued.  This included an ultrasound which demonstrated the patient to have  gallstones.  However, his liver function tests were normal and his white  blood cell count was normal and he still had significant abdominal pain.   No abdominal films were done but because of the positive ultrasound for  gallstones, it was suggested that a HIDA scan be done to confirm the  presence of acute cholecystitis or cystic duct obstruction.   The HIDA was done, which showed no flow into the small bowel, but the  gallbladder did fill itself.  No flow into the small bowel was likely  secondary to morphine or narcotic administration prior to the study.  Subsequent exam included a CT scan of the abdomen and pelvis which  demonstrated what appears to be a fairly high-grade small-bowel obstruction,  with no previous history of abdominal surgery.  Surgical consultation was  obtained.   PAST MEDICAL HISTORY:  His past medical history is significant for:  1. Coronary artery disease, status post  cardiac stents; the first one was in     June of 2003 and most recently in February of 2005.  2. Hypertension.  3. Hyperlipidemia.  4. Benign prostatic hypertrophy.  5. History of a TIA.   MEDICATIONS:  His medications include:  1. Aspirin 4 tablets once a day.  2. Plavix.  3. Toprol.  4. Pravachol.  5. Zetia.  6. Protonix.   ALLERGIES:  He has no known drug allergies.   FAMILY HISTORY:  His father died at the age of 2 with a history of  bilateral BKA and peripheral vascular disease.  His mother died at the age  of 11 secondary to pneumonia after a hip fracture; she had a history of  diabetes.  He does have 1 brother who died of coronary artery disease and 1  brother who died of pancreatic cancer.   SOCIAL HISTORY:  He is married;  his wife is with him at the bedside.  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  He does not  smoke, does not drink alcohol excessively.   REVIEW OF SYSTEMS:  The patient has had no chest pain or shortness of  breath.  He has had nausea, no vomiting, no fever or chills, no jaundice.  He did have a bowel movement prior to coming to the emergency room.   PHYSICAL EXAMINATION:  GENERAL:  On examination, the patient is very alert,  conscious, apparently in no acute distress.  He describes his pain as a  1/10, but he has received several doses of Dilaudid and some morphine.  HEENT:  He is normocephalic and atraumatic and anicteric.  He has no scleral  icterus.  His mucous membranes are moist and pink.  NECK:  No rebound or guarding.  Neck is supple.  No carotid bruits  bilaterally.  CHEST:  His chest is clear to auscultation.  CARDIAC:  He has got a regular rhythm and rate with no murmurs, no gallops  or lifts or heaves.  ABDOMEN:  His abdomen is distended with hyperactive bowel sounds,  questionable palpable fullness in the upper to midepigastric area, slightly  to the left of midline, where there seemed to be a palpable  fullness.  No  palpable spleen or liver on either side.  It should be mentioned also, the  patient has no palpable incarcerated hernias.  RECTAL:  Normal tone.  Prostate appeared to be mildly enlarged.  There is no  gross blood and he is guaiac-negative.  NEUROLOGIC:  Cranial nerves II-XII are grossly intact.   LABORATORY STUDIES:  He has a normal white count of 5.2, a normal hemoglobin  of 14.2 and hematocrit of 45.  Liver function tests were normal, amylase and  lipase were normal, electrolytes are within normal limits.   The CT scan demonstrates what appears to be a high-grade obstruction with a  transition in the pelvic inlet on the right side.   IMPRESSION:  Small-bowel obstruction in an otherwise healthy gentleman with  no previous history of abdominal surgery. Although he has had a  tonsillectomy, he has had no abdominal procedure.   PLAN:  The patient will be prepared for surgery for an exploratory  laparotomy.  The possible etiologies include foreign body, bezoar, maybe a  small bowel perforation which has sealed.  His pain being in the upper  epigastrium, maybe he has an ulcer with a subsequent ileus.  He will go to  the floor, and he also does have gallstones but no evidence of acute  cholecystitis.  We will examine this at the time of surgery and do a  possible cholecystectomy, if this does not prolong the treatment of any  other intra-abdominal process.                                                Kathrin Ruddy, M.D.    JW/MEDQ  D:  06/02/2003  T:  06/02/2003  Job:  [id number]

## 2010-06-15 NOTE — Assessment & Plan Note (Signed)
[facility]                             PULMONARY OFFICE NOTE   BYFORD, SCHOOLS                      MRN:          [id number]  DATE:01/02/2006                            DOB:          Mar 06, 1932    PRIMARY CARE PHYSICIAN:  Dr. Madison Hickman.   PROBLEM:  77 year old gentleman, self referred, to establish for sleep  medicine management, because of sleep apnea.   HISTORY:  He has had sleep apnea for over 15 years. More recently, he  had begun having headaches and there was consideration these might be  due to inadequate control of his sleep apnea. He had a diagnostic NPSG  at [facility], which recorded an AHI of 6.98 per hour, which is minimal  (normal range is 0-5 per hour). Oxygen desaturation to 79%, with loud  snoring. He then had CPAP titration on 06/18/2005, and has been using  CPAP since then. Titration was to 5 CWP, but the [facility]  technician taught him to adjust the machine himself, which is ordinarily  contraindicated. He has reset it to 7, and he says it works great.  Headaches are gone, and he says his wife is back sleeping in the same  bedroom with him, now that his snoring is better controlled. He feels he  sleeps well, rarely needing supplemental fragment of an Ambien tablet to  return to sleep if he wakes during the night. Bedtime is between 10-  11pm, estimated 5 minute sleep latency and waking briefly 2-3 times  during the night, before final waking at 6am.   MEDICATIONS:  1. CPAP at 7 CWP, through [facility].  2. Aspirin 325 mg.  3. Melatonin.  4. Zetia 10 mg.  5. Metoprolol 25 mg.  6. Fish oil.  7. Crestor 5 mg q.o.d.  8. Flomax.  9. Ambien.  10.Colace.  11.Over-the-counter Prilosec.   Drug intolerant of PREDNISONE.   REVIEW OF SYSTEMS:  Occasional daytime nap, weight has been stable,  occasional bruxism, no syncope or confusion, palpitation, or chest pain.  No leg edema. He denies leg jerks,  or unusual behaviors during sleep.   PAST HISTORY:  Tonsillectomy, no other ENT surgery. No history of  thyroid problems. Myocardial infarction, prostate cancer, surgical  repair of Dupuytren's contracture, angioplasty with stent.   SOCIAL HISTORY:  Quit smoking in 1962. One or two glasses of wine a day.  Married. 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   FAMILY HISTORY:  Nobody with significant sleep related problems that he  is aware of.   OBJECTIVE:  Weight 237 pounds, blood pressure 100/60, pulse regular 55,  room air saturation 96%. This is a tall man, somewhat overweight, very  calm, controlled affect.  HEENT: Nasal airway is clear. Palette length 3-4/4, residual tonsils are  present. There is no strider or thyromegaly.  No neck vein distension.  CHEST: Quiet, clear breath sounds, no wheeze, rales or rhonchi. Heart  sounds are regular. I do not hear murmur or gallop.  There is no tremor or restlessness.   IMPRESSION:  1.  Mild obstructive sleep apnea, comfortably controlled with a CPAP of      7 CWP through [facility].  2. Exogenous obesity. Weight loss would help with his sleep apnea.  3. Coronary disease.   PLAN:  1. He will continue CPAP at 7 CWP, since it has provided therapeutic      relief.  2. Schedule return 1 year, to maintain contact, earlier p.r.n.     Clinton D. Maple Hudson, MD, Tonny Bollman, FACP  Electronically Signed    CDY/MedQ  DD: 01/04/2006  DT: 01/05/2006  Job #: [id number]

## 2010-06-15 NOTE — Op Note (Signed)
NAME:  Randall Chen, ARDIZZONE               ACCOUNT NO.:  [id number]   MEDICAL RECORD NO.:  [id number]          PATIENT TYPE:  AMB   LOCATION:  NESC                         FACILITY:  [facility]   PHYSICIAN:  Lindaann Slough, M.D.  DATE OF BIRTH:  [birth year]   DATE OF PROCEDURE:  02/18/2005  DATE OF DISCHARGE:                                 OPERATIVE REPORT   PREOPERATIVE DIAGNOSIS:  Adenocarcinoma of prostate.   POSTOPERATIVE DIAGNOSIS:  Adenocarcinoma of prostate.   PROCEDURE:  I-125 seeds implantation.   SURGEON:  Danae Chen, M.D. and Maryln Gottron, M.D.   ANESTHESIA:  General.   INDICATIONS:  The patient is 77 year old male who has prostate cancer,  Gleason score 6. PSA at the time of diagnosis in March2006 was 2.0 and  another PSA done in July was 2.5. He was found not to have a nodule at the  right base of the prostate. The biopsy was positive at the right base.  Treatment options were discussed with him and he chose to have  brachytherapy. He is scheduled today for the procedure.   Under general anesthesia, the patient was prepped and draped and placed in  the dorsolithotomy position. Ultrasound planning was done by Dr. Dayton Scrape.  Then the grid was attached to the transducer and under ultrasound guidance a  total of 22 needles were passed through the prostate and 63 seeds were  implanted in the prostate. There is good seeds distribution.   The Foley catheter that was inserted in the bladder was then removed and a  flexible cystoscope was passed in the bladder. The anterior urethra is  normal. He has moderate prostatic hypertrophy. There is a free floating seed  in the prostatic urethra and then two other seeds were in the bladder. There  is no stone or tumor in the bladder. The ureteral orifices are in normal  position and shape with clear efflux. The prostatic urethra seed was pushed  back in the bladder and the three seeds were then removed with the alligator  forceps. Then a  #16 Foley catheter was inserted in the bladder.   The patient tolerated the procedure well and left the OR in satisfactory  condition to post anesthesia care unit.      Lindaann Slough, M.D.  Electronically Signed    MN/MEDQ  D:  02/18/2005  T:  02/19/2005  Job:  [id number]   cc:   Maryln Gottron, M.D.  Fax: [phone]

## 2010-06-15 NOTE — Discharge Summary (Signed)
NAME:  Randall Chen, Randall Chen NO.:  [id number]   MEDICAL RECORD NO.:  [id number]                   PATIENT TYPE:  INP   LOCATION:  6703                                 FACILITY:  [facility]   PHYSICIAN:  Jimmye Norman III, M.D.               DATE OF BIRTH:  05/31/1932   DATE OF ADMISSION:  06/01/2003  DATE OF DISCHARGE:  06/06/2003                                 DISCHARGE SUMMARY   DISCHARGE DIAGNOSES:  DeNovo small-bowel obstruction secondary to internal  hernia between loop of omentum attached to the mesentery of the transverse  colon.   PROCEDURE:  Exploratory laparotomy with enterolysis and no bowel resection  was required.   DISCHARGE MEDICATIONS:  Darvocet-N 100 one to two every four hours as needed  for pain. He is to resume all of his preoperative medication including  Plavix and aspirin.   DIET:  Soft diet to be advanced as tolerated.   CONDITION ON DISCHARGE:  Stable.   DISCHARGE FOLLOW UP:  He has a follow-up to see me a week from this Tuesday,  which will be Jun 14, 2003.   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 He had no previous history of abdominal surgery. Workup included an  ultrasound, which they felt was most significant for a gallstone noted on  the ultrasound, however, he had normal liver function tests and normal white  blood cell count. His abdominal findings did not fit the pattern of  cholecystitis. He underwent a HIDA scan, which showed that his gallbladder  did visualize indicating a negative examination and therefore further workup  included a CAT scan. He had not had any plain abdominal films.   CT scan demonstrated a high grade small-bowel obstruction. The patient was  taken to surgery the next day, the following morning. At that time he was  found  to have a high grade small-bowel obstruction secondary to adhesion,  routine omentum and the mesentery of the left transverse colon with an  internal hernia obstructing the proximal to mid small bowel.   The adhesions were taken down. There as no evidence of any tumor. There were  some undigested several pieces of peanuts and maybe pills in the bowel, but  no __________ and no obstruction of the terminal ileum. The patient had no  other intra-abdominal pathology noted. He was closed and subsequently went  on to recover over the weekend where he subsequently started to have bowel  movement and pass gas. His abdomen was soft and flat, had good bowel sounds.  No tenderness. The ventitube was removed on Saturday, two days prior to  discharge. He has been tolerating a liquid diet since then. He has been  advanced to a  soft diet today and we will discharge this afternoon if he  tolerates a soft diet well. He is being started back on his Plavix and  aspirin and his other preoperative cardiac  medications prior to discharge. He will follow up to see me a week from this  Tuesday, which will be May 17 and I have contacted. Dr. Armanda Magic to let  her know that the patient has been in the hospital and is resuming his  cardiac medications.                                                Kathrin Ruddy, M.D.    JW/MEDQ  D:  06/06/2003  T:  06/06/2003  Job:  [id number]   cc:   Armanda Magic, M.D.  [address]  [place], [place] [address]  Fax: [phone]   Ike Bene, M.D.  [address]  [place]  [place] [address]  Fax: [phone]

## 2012-04-27 ENCOUNTER — Other Ambulatory Visit: Payer: Self-pay | Admitting: Cardiology

## 2012-11-04 DIAGNOSIS — C4491 Basal cell carcinoma of skin, unspecified: Secondary | ICD-10-CM

## 2012-11-04 HISTORY — DX: Basal cell carcinoma of skin, unspecified: C44.91

## 2013-01-18 ENCOUNTER — Telehealth: Payer: Self-pay | Admitting: Cardiology

## 2013-01-18 ENCOUNTER — Other Ambulatory Visit: Payer: Self-pay | Admitting: General Surgery

## 2013-01-18 MED ORDER — ROSUVASTATIN CALCIUM 5 MG PO TABS: 5.0000 mg | ORAL_TABLET | Freq: Every day | ORAL | Status: DC

## 2013-01-18 NOTE — Telephone Encounter (Signed)
Patient called and needs a written script for crestor faxed to [facility] fax #[phone], he states this is second request and since it wasn't done earlier he also needs a 30 day supply sent to [facility], quantity amount to [place] 100. Dr Mayford Knife is the physician.

## 2013-01-18 NOTE — Telephone Encounter (Signed)
New Message  Pt requests a script// transferred to medications dept.

## 2013-01-18 NOTE — Telephone Encounter (Signed)
Escribed 30 day supply to [facility].

## 2013-01-18 NOTE — Telephone Encounter (Signed)
Patient on Crestor 5 mg qday.  We don't send prescriptions directly to [place].  We can mail patient a prescription for 90 day supply with 3 refills and he can fill prescription wherever he wants, and we can also send in an electronic prescription for 30 day supply to [facility].  Danielle, please have Dr. Mayford Knife sign a prescription for Crestor 5 mg for 90 day supply and 3 refills and mail it to patient, and send in 30 day e-scribe to [facility]. Call patient to let him know.  If he wants to mail this prescription to a pharmacy he can do so.

## 2013-01-18 NOTE — Telephone Encounter (Signed)
Called pt and LVm about being able to mail a rx to him or he can come pick up but that we can not fax in for him. Waiting on a response on how he would like [place] to proceed. I sent a 30 day supply in of the crestor to [facility] as well. Dr Mayford Knife is not in Office today. Will have her sign a rx for pt tomorrow when she is in office.

## 2013-01-19 ENCOUNTER — Telehealth: Payer: Self-pay

## 2013-01-19 NOTE — Telephone Encounter (Signed)
Waiting on Dr Mayford Knife to sign RX sent to our office from pharmacy. I will then call pt and let them know we can fax the rx since the pharmacy sent [place] a request.

## 2013-01-19 NOTE — Telephone Encounter (Signed)
Pt is made aware. 

## 2013-01-19 NOTE — Telephone Encounter (Signed)
PATIENT CALLED ABOUT HIS REFILL OF CRESTOR RX .I TOLD HIM THAT A RX WAS SENT TO [facility] FOR 30 DAYS. HE ALSO WANTED TO KNOW WHY WE COULD NOT FAX A RX TO [place]. I TOLD HIM THAT WE JUST COULD NOT FAX A RX TO A NUMBER WITHOUT KNOWING THAT IT WAS A REAL PHARMACY BECAUSE OF HIPPA. HE WAS OK WITH THIS, WE WILL TALK TO THE SALLY EARL TO MAKE SURE WE CAN FAX TO [place] [facility]Y WHEN SHE GETS HERE AND IF DIFFERENT THEN I WILL CALL HIM BACK

## 2013-02-05 ENCOUNTER — Telehealth: Payer: Self-pay

## 2013-02-05 ENCOUNTER — Other Ambulatory Visit: Payer: Self-pay | Admitting: General Surgery

## 2013-02-05 MED ORDER — EZETIMIBE 10 MG PO TABS: 10.0000 mg | ORAL_TABLET | Freq: Every day | ORAL | Status: DC

## 2013-02-05 MED ORDER — ROSUVASTATIN CALCIUM 5 MG PO TABS: 5.0000 mg | ORAL_TABLET | Freq: Every day | ORAL | Status: DC

## 2013-02-05 NOTE — Telephone Encounter (Signed)
Printed and had Dr Radford Pax to sign for pt to pick up in office to send to pharmacy.

## 2013-02-05 NOTE — Telephone Encounter (Signed)
Patient called about his rx to be mailed to him, but he was called 2wks ago to let him know that it was faxed. He stated that his pharmacy did not get it. So I printed them out and got them sign and placed the rx and samples of crestor and zetia up front

## 2013-02-14 ENCOUNTER — Other Ambulatory Visit: Payer: Self-pay | Admitting: *Deleted

## 2013-02-14 DIAGNOSIS — Z79899 Other long term (current) drug therapy: Secondary | ICD-10-CM

## 2013-02-14 DIAGNOSIS — I2510 Atherosclerotic heart disease of native coronary artery without angina pectoris: Secondary | ICD-10-CM

## 2013-03-02 ENCOUNTER — Encounter: Payer: Self-pay | Admitting: General Surgery

## 2013-03-02 DIAGNOSIS — I2510 Atherosclerotic heart disease of native coronary artery without angina pectoris: Secondary | ICD-10-CM | POA: Insufficient documentation

## 2013-03-02 DIAGNOSIS — I10 Essential (primary) hypertension: Secondary | ICD-10-CM | POA: Insufficient documentation

## 2013-03-02 DIAGNOSIS — E785 Hyperlipidemia, unspecified: Secondary | ICD-10-CM | POA: Insufficient documentation

## 2013-03-02 DIAGNOSIS — G4733 Obstructive sleep apnea (adult) (pediatric): Secondary | ICD-10-CM

## 2013-03-02 DIAGNOSIS — I359 Nonrheumatic aortic valve disorder, unspecified: Secondary | ICD-10-CM

## 2013-03-09 ENCOUNTER — Other Ambulatory Visit: Payer: Self-pay

## 2013-03-10 ENCOUNTER — Ambulatory Visit: Payer: Self-pay | Admitting: Cardiology

## 2013-06-10 ENCOUNTER — Ambulatory Visit: Payer: Self-pay | Admitting: Cardiology

## 2013-06-14 ENCOUNTER — Encounter: Payer: Self-pay | Admitting: Cardiology

## 2013-06-14 ENCOUNTER — Other Ambulatory Visit: Payer: Self-pay | Admitting: General Surgery

## 2013-06-14 ENCOUNTER — Ambulatory Visit (INDEPENDENT_AMBULATORY_CARE_PROVIDER_SITE_OTHER): Payer: Commercial Managed Care - HMO | Admitting: Cardiology

## 2013-06-14 VITALS — BP 122/74 | HR 64

## 2013-06-14 DIAGNOSIS — G4733 Obstructive sleep apnea (adult) (pediatric): Secondary | ICD-10-CM | POA: Insufficient documentation

## 2013-06-14 DIAGNOSIS — E785 Hyperlipidemia, unspecified: Secondary | ICD-10-CM

## 2013-06-14 DIAGNOSIS — I77810 Thoracic aortic ectasia: Secondary | ICD-10-CM | POA: Insufficient documentation

## 2013-06-14 DIAGNOSIS — I359 Nonrheumatic aortic valve disorder, unspecified: Secondary | ICD-10-CM

## 2013-06-14 DIAGNOSIS — I2510 Atherosclerotic heart disease of native coronary artery without angina pectoris: Secondary | ICD-10-CM

## 2013-06-14 DIAGNOSIS — I10 Essential (primary) hypertension: Secondary | ICD-10-CM

## 2013-06-14 DIAGNOSIS — I350 Nonrheumatic aortic (valve) stenosis: Secondary | ICD-10-CM | POA: Insufficient documentation

## 2013-06-14 DIAGNOSIS — I4891 Unspecified atrial fibrillation: Secondary | ICD-10-CM

## 2013-06-14 MED ORDER — ROSUVASTATIN CALCIUM 5 MG PO TABS: 5.0000 mg | ORAL_TABLET | Freq: Every day | ORAL | Status: DC

## 2013-06-14 MED ORDER — WARFARIN SODIUM 5 MG PO TABS: 5.0000 mg | ORAL_TABLET | Freq: Every day | ORAL | Status: DC

## 2013-06-14 MED ORDER — EZETIMIBE 10 MG PO TABS: 10.0000 mg | ORAL_TABLET | Freq: Every day | ORAL | Status: DC

## 2013-06-14 NOTE — Progress Notes (Addendum)
 [address]  [place], [place] [address]  Phone: [phone]  Fax: [phone]  Date: 06/14/2013  ID: Euell R Marcello, DOB 03/26/1932, MRN [id number]  PCP: GRIFFIN,JOHN JOSEPH, MD  Cardiologist: Wyndham Santilli, MD  History of Present Illness:  Randall Chen is a 80 y.o. male with a history of ASCAD s/p inferior MI 2003 with PCI of RCA 6/03 and then PCI of left circ/OM 2/05, dyslpidemia, bicuspid AV, mildly dilated aorta, OSA on CPAP and HTN who presents today for followup. He is doing well. He denies any chest pain, SOB, DOE (except walking up stairs), LE edema, dizziness, palpitations or syncope. He tolerates his CPAP well. He feels rested when he gets up and takes a 30 minutes nap daily because he only gets 6 hours of sleep nightly. He tolerates his mask and feels the pressure is adequate.  Wt Readings from Last 3 Encounters:   01/16/09  242 lb (109.77 kg)   12/21/08  249 lb (112.946 kg)    Past Medical History   Diagnosis  Date   .  Coronary disease      2 Vessel,inferior wall MI june 2003, PTCA and Stent RCA June 2003, PTCA stent, Left Circumflex OM 02 Mar 2003, Dr Iyad Deroo   .  Dyslipidemia    .  Prostate cancer      /BPH, radioactive seed 2007, [place]   .  History of echocardiogram  08/2010     Bicuspid aortic valve w moderate aortic valve sclerosis,mild AS and mildly dilated aorta   .  OSA (obstructive sleep apnea)      presented with HA and drowsiness,mild sleep apnea with oxygen desats to 78%   .  Hypertension    .  Obesity    .  DJD (degenerative joint disease) of knee      LEFT   .  History of rotator cuff syndrome      (Right) with biceps tendinitis   .  GERD (gastroesophageal reflux disease)    .  Aortic stenosis      mild by echo 02/2012   .  Dilated aortic root     Current Outpatient Prescriptions   Medication  Sig  Dispense  Refill   .  aspirin 81 MG tablet  Take 81 mg by mouth daily.     .  diphenhydrAMINE (BENADRYL) 25 MG tablet  Take 25 mg by mouth at  bedtime as needed.     .  docusate sodium (STOOL SOFTENER) 100 MG capsule  Take 100 mg by mouth daily.     .  ezetimibe (ZETIA) 10 MG tablet  Take 1 tablet (10 mg total) by mouth daily.  90 tablet  3   .  loratadine (CLARITIN) 10 MG tablet  Take 10 mg by mouth daily as needed for allergies.     .  metoprolol tartrate (LOPRESSOR) 25 MG tablet  Take 25 mg by mouth 2 (two) times daily.     .  Multiple Vitamin (MULTIVITAMIN) tablet  Take 1 tablet by mouth daily.     .  nitroGLYCERIN (NITROSTAT) 0.4 MG SL tablet  Place 0.4 mg under the tongue every 5 (five) minutes as needed for chest pain.     .  Omega-3 Fatty Acids (FISH OIL) 1000 MG CAPS  Take 2,000 mg by mouth daily.     .  omeprazole (PRILOSEC) 20 MG capsule  Take 20 mg by mouth daily.     .  rosuvastatin (  CRESTOR) 5 MG tablet  Take 1 tablet (5 mg total) by mouth daily.  90 tablet  3   .  tamsulosin (FLOMAX) 0.4 MG CAPS capsule  Take 0.4 mg by mouth daily.     .  zolpidem (AMBIEN) 5 MG tablet  Take 5 mg by mouth at bedtime as needed for sleep (1/2 tablet -1 tablet as needed at bedtime).      No current facility-administered medications for this visit.   Allergies:  Allergies   Allergen  Reactions   .  Prednisone      REACTION: anxiety reaction   .  Zocor [Simvastatin]      Muscle aches   .  Oxycodone Hcl  Rash   Social History: The patient reports that he quit smoking about 55 years ago. He does not have any smokeless tobacco history on file. He reports that he drinks alcohol. He reports that he does not use illicit drugs.  Family History: The patient's family history includes Coronary artery disease in his brother and brother; Diabetes in his mother; Liver cancer in his brother; Peripheral vascular disease in his father.  ROS: Please see the history of present illness. All other systems reviewed and negative.  PHYSICAL EXAM:  VS: BP 122/74  Pulse 64  SpO2 99%  Well nourished, well developed, in no acute distress  HEENT: normal  Neck:  no JVD  Cardiac: normal S1, S2; RRR; no murmur  Lungs: clear to auscultation bilaterally, no wheezing, rhonchi or rales  Abd: soft, nontender, no hepatomegaly  Ext: no edema  Skin: warm and dry  Neuro: CNs 2-12 intact, no focal abnormalities noted  EKG: Atrial fibrillation with SVR at 57bpm  ASSESSMENT AND PLAN:  1. ASCAD with no angina - continue ASA/metoprolol  2. HTN well controlled - continue metoprolol  3. Dyslipidemia - lipids at goal a year ago - continue crestor/zetia  - check fasting NMR panel and ALT  4. OSA on CPAP and tolerating well. His download today showed an AHIo f 2.5/hr on 7cm H2O and 86% compliance in using more than 4 hours nightly.  5. Mild AS  6. Mildly dilated aortic root - recheck 2D echo  7. New onset atrial fibrillation with SVR of unknown duration  - start Coumadin 5 mg daily - he is not a candidate for NOAC agents due to valvular heart disease with bicuspid AV and mild AS  - Coumadin clinic on Thursday  - Once INR has been >2 for 4 weeks will plan DCCV  Followup with me in 5 weeks  Signed,  Bengie Kaucher, MD  06/14/2013 9:01 AM   

## 2013-06-14 NOTE — Addendum Note (Signed)
Addended by: Lily Kocher on: 06/14/2013 09:30 AM   Modules accepted: Orders

## 2013-06-14 NOTE — Patient Instructions (Addendum)
Your physician has recommended you make the following change in your medication:   1. Start Coumadin 5 MG 1 tablet daily  You have been referred to Coumadin Clinic. Dr Radford Pax wants pt seen this Thursday. Please schedule at check out today  Your physician has requested that you have an echocardiogram. Echocardiography is a painless test that uses sound waves to create images of your heart. It provides your doctor with information about the size and shape of your heart and how well your heart's chambers and valves are working. This procedure takes approximately one hour. There are no restrictions for this procedure.  Your physician recommends that you schedule a follow-up appointment in: 5 weeks with Dr Radford Pax

## 2013-06-17 ENCOUNTER — Other Ambulatory Visit (INDEPENDENT_AMBULATORY_CARE_PROVIDER_SITE_OTHER): Payer: Commercial Managed Care - HMO

## 2013-06-17 ENCOUNTER — Ambulatory Visit (INDEPENDENT_AMBULATORY_CARE_PROVIDER_SITE_OTHER): Payer: Commercial Managed Care - HMO | Admitting: Pharmacist

## 2013-06-17 DIAGNOSIS — Z5181 Encounter for therapeutic drug level monitoring: Secondary | ICD-10-CM | POA: Insufficient documentation

## 2013-06-17 DIAGNOSIS — I4891 Unspecified atrial fibrillation: Secondary | ICD-10-CM

## 2013-06-17 DIAGNOSIS — I4819 Other persistent atrial fibrillation: Secondary | ICD-10-CM | POA: Insufficient documentation

## 2013-06-17 DIAGNOSIS — E785 Hyperlipidemia, unspecified: Secondary | ICD-10-CM

## 2013-06-17 LAB — HEPATIC FUNCTION PANEL
ALT: 26 U/L (ref 0–53)
AST: 33 U/L (ref 0–37)
Albumin: 3.7 g/dL (ref 3.5–5.2)
Alkaline Phosphatase: 58 U/L (ref 39–117)
Bilirubin, Direct: 0.1 mg/dL (ref 0.0–0.3)
Total Bilirubin: 0.6 mg/dL (ref 0.2–1.2)
Total Protein: 6.9 g/dL (ref 6.0–8.3)

## 2013-06-17 LAB — POCT INR: INR: 1.8

## 2013-06-18 LAB — NMR LIPOPROFILE WITH LIPIDS
Cholesterol, Total: 101 mg/dL (ref <200)
HDL Particle Number: 31.3 umol/L (ref >=30.5)
HDL Size: 9.5 nm (ref >=9.2)
HDL-C: 42 mg/dL (ref >=40)
LDL (calc): 44 mg/dL (ref <100)
LDL Particle Number: 669 nmol/L (ref <1000)
LDL Size: 19.8 nm — ABNORMAL LOW (ref >20.5)
LP-IR Score: 39 (ref <=45)
Large HDL-P: 8 umol/L (ref >=4.8)
Large VLDL-P: 1.7 nmol/L (ref <=2.7)
Small LDL Particle Number: 451 nmol/L (ref <=527)
Triglycerides: 75 mg/dL (ref <150)
VLDL Size: 48.7 nm — ABNORMAL HIGH (ref <=46.6)

## 2013-06-22 ENCOUNTER — Ambulatory Visit (INDEPENDENT_AMBULATORY_CARE_PROVIDER_SITE_OTHER): Payer: Commercial Managed Care - HMO | Admitting: Pharmacist Clinician (PhC)/ Clinical Pharmacy Specialist

## 2013-06-22 DIAGNOSIS — Z5181 Encounter for therapeutic drug level monitoring: Secondary | ICD-10-CM

## 2013-06-22 DIAGNOSIS — I4891 Unspecified atrial fibrillation: Secondary | ICD-10-CM | POA: Diagnosis not present

## 2013-06-22 LAB — POCT INR: INR: 2.1

## 2013-07-01 ENCOUNTER — Ambulatory Visit (INDEPENDENT_AMBULATORY_CARE_PROVIDER_SITE_OTHER): Payer: Commercial Managed Care - HMO | Admitting: Pharmacist

## 2013-07-01 DIAGNOSIS — Z5181 Encounter for therapeutic drug level monitoring: Secondary | ICD-10-CM

## 2013-07-01 DIAGNOSIS — I4891 Unspecified atrial fibrillation: Secondary | ICD-10-CM

## 2013-07-01 LAB — POCT INR: INR: 3.7

## 2013-07-05 ENCOUNTER — Ambulatory Visit (HOSPITAL_COMMUNITY): Payer: Medicare PPO | Attending: Cardiology | Admitting: Radiology

## 2013-07-05 DIAGNOSIS — I77810 Thoracic aortic ectasia: Secondary | ICD-10-CM

## 2013-07-05 DIAGNOSIS — I4891 Unspecified atrial fibrillation: Secondary | ICD-10-CM

## 2013-07-05 DIAGNOSIS — I359 Nonrheumatic aortic valve disorder, unspecified: Secondary | ICD-10-CM

## 2013-07-05 NOTE — Progress Notes (Signed)
Echocardiogram performed.  

## 2013-07-06 ENCOUNTER — Other Ambulatory Visit: Payer: Self-pay | Admitting: Cardiology

## 2013-07-06 DIAGNOSIS — I77810 Thoracic aortic ectasia: Secondary | ICD-10-CM

## 2013-07-06 DIAGNOSIS — I359 Nonrheumatic aortic valve disorder, unspecified: Secondary | ICD-10-CM

## 2013-07-06 DIAGNOSIS — E785 Hyperlipidemia, unspecified: Secondary | ICD-10-CM

## 2013-07-08 ENCOUNTER — Ambulatory Visit (INDEPENDENT_AMBULATORY_CARE_PROVIDER_SITE_OTHER): Payer: Commercial Managed Care - HMO | Admitting: *Deleted

## 2013-07-08 DIAGNOSIS — I4891 Unspecified atrial fibrillation: Secondary | ICD-10-CM

## 2013-07-08 DIAGNOSIS — Z5181 Encounter for therapeutic drug level monitoring: Secondary | ICD-10-CM

## 2013-07-08 LAB — POCT INR: INR: 2.6

## 2013-07-15 ENCOUNTER — Ambulatory Visit (INDEPENDENT_AMBULATORY_CARE_PROVIDER_SITE_OTHER): Payer: Commercial Managed Care - HMO

## 2013-07-15 ENCOUNTER — Other Ambulatory Visit (INDEPENDENT_AMBULATORY_CARE_PROVIDER_SITE_OTHER): Payer: Commercial Managed Care - HMO

## 2013-07-15 ENCOUNTER — Other Ambulatory Visit: Payer: Self-pay | Admitting: General Surgery

## 2013-07-15 ENCOUNTER — Encounter: Payer: Self-pay | Admitting: General Surgery

## 2013-07-15 DIAGNOSIS — I4891 Unspecified atrial fibrillation: Secondary | ICD-10-CM

## 2013-07-15 DIAGNOSIS — Z5181 Encounter for therapeutic drug level monitoring: Secondary | ICD-10-CM

## 2013-07-15 LAB — CBC WITH DIFFERENTIAL/PLATELET
Basophils Absolute: 0 10*3/uL (ref 0.0–0.1)
Basophils Relative: 0.4 % (ref 0.0–3.0)
Eosinophils Absolute: 0.2 10*3/uL (ref 0.0–0.7)
Eosinophils Relative: 4 % (ref 0.0–5.0)
HCT: 43.3 % (ref 39.0–52.0)
Hemoglobin: 14.4 g/dL (ref 13.0–17.0)
Lymphocytes Relative: 42.6 % (ref 12.0–46.0)
Lymphs Abs: 2.2 10*3/uL (ref 0.7–4.0)
MCHC: 33.3 g/dL (ref 30.0–36.0)
MCV: 91.8 fl (ref 78.0–100.0)
Monocytes Absolute: 0.6 10*3/uL (ref 0.1–1.0)
Monocytes Relative: 11.5 % (ref 3.0–12.0)
Neutro Abs: 2.2 10*3/uL (ref 1.4–7.7)
Neutrophils Relative %: 41.5 % — ABNORMAL LOW (ref 43.0–77.0)
Platelets: 142 10*3/uL — ABNORMAL LOW (ref 150.0–400.0)
RBC: 4.71 Mil/uL (ref 4.22–5.81)
RDW: 14.2 % (ref 11.5–15.5)
WBC: 5.2 10*3/uL (ref 4.0–10.5)

## 2013-07-15 LAB — BASIC METABOLIC PANEL
BUN: 18 mg/dL (ref 6–23)
CO2: 27 mEq/L (ref 19–32)
Calcium: 9.3 mg/dL (ref 8.4–10.5)
Chloride: 110 mEq/L (ref 96–112)
Creatinine, Ser: 1.1 mg/dL (ref 0.4–1.5)
GFR: 66.58 mL/min (ref >60.00)
Glucose, Bld: 97 mg/dL (ref 70–99)
Potassium: 4.4 mEq/L (ref 3.5–5.1)
Sodium: 140 mEq/L (ref 135–145)

## 2013-07-15 LAB — POCT INR: INR: 2.5

## 2013-07-19 ENCOUNTER — Encounter (HOSPITAL_COMMUNITY)
Admission: RE | Disposition: A | Discharge status: Home / Self Care | Payer: Self-pay | Source: Ambulatory Visit | Attending: Cardiology

## 2013-07-19 ENCOUNTER — Encounter: Payer: Self-pay | Admitting: General Surgery

## 2013-07-19 ENCOUNTER — Ambulatory Visit (INDEPENDENT_AMBULATORY_CARE_PROVIDER_SITE_OTHER): Payer: Commercial Managed Care - HMO | Admitting: *Deleted

## 2013-07-19 ENCOUNTER — Encounter (HOSPITAL_COMMUNITY): Payer: Medicare PPO | Admitting: Certified Registered Nurse Anesthetist

## 2013-07-19 ENCOUNTER — Encounter (HOSPITAL_COMMUNITY): Payer: Self-pay | Admitting: Certified Registered Nurse Anesthetist

## 2013-07-19 ENCOUNTER — Ambulatory Visit (HOSPITAL_COMMUNITY)
Admission: RE | Admit: 2013-07-19 | Discharge: 2013-07-19 | Disposition: A | Discharge status: Home / Self Care | Payer: Medicare PPO | Source: Ambulatory Visit | Attending: Cardiology | Admitting: Cardiology

## 2013-07-19 ENCOUNTER — Ambulatory Visit (HOSPITAL_COMMUNITY): Payer: Medicare PPO | Admitting: Certified Registered Nurse Anesthetist

## 2013-07-19 DIAGNOSIS — D689 Coagulation defect, unspecified: Secondary | ICD-10-CM | POA: Insufficient documentation

## 2013-07-19 DIAGNOSIS — Z5309 Procedure and treatment not carried out because of other contraindication: Secondary | ICD-10-CM | POA: Insufficient documentation

## 2013-07-19 DIAGNOSIS — Z5181 Encounter for therapeutic drug level monitoring: Secondary | ICD-10-CM

## 2013-07-19 DIAGNOSIS — I4891 Unspecified atrial fibrillation: Secondary | ICD-10-CM

## 2013-07-19 LAB — PROTIME-INR
INR: 1.84 — ABNORMAL HIGH (ref 0.00–1.49)
Prothrombin Time: 20.7 seconds — ABNORMAL HIGH (ref 11.6–15.2)

## 2013-07-19 LAB — POCT INR: INR: 1.9

## 2013-07-19 SURGERY — CANCELLED PROCEDURE
Anesthesia: Monitor Anesthesia Care

## 2013-07-19 MED ORDER — SODIUM CHLORIDE 0.9 % IJ SOLN: 3.0000 mL | Freq: Two times a day (BID) | INTRAMUSCULAR | Status: DC

## 2013-07-19 MED ORDER — SODIUM CHLORIDE 0.9 % IV SOLN: 250.0000 mL | INTRAVENOUS | Status: DC

## 2013-07-19 MED ORDER — SODIUM CHLORIDE 0.9 % IV SOLN: INTRAVENOUS | Status: DC

## 2013-07-19 MED ORDER — SODIUM CHLORIDE 0.9 % IV SOLN
INTRAVENOUS | Status: AC | PRN
  Administered 2013-07-19: 08:00:00 via INTRAVENOUS

## 2013-07-19 MED ORDER — SODIUM CHLORIDE 0.9 % IJ SOLN: 3.0000 mL | INTRAMUSCULAR | Status: DC | PRN

## 2013-07-19 MED ORDER — SODIUM CHLORIDE 0.9 % IV SOLN
INTRAVENOUS | Status: DC
  Administered 2013-07-19: 500 mL via INTRAVENOUS

## 2013-07-19 NOTE — Progress Notes (Signed)
Pt and spouse made aware of labs and need for therapeutic INR.Marland Kitchenthe patient/ wife understand delay.. INR not acceptable, drawn and sent and results questioned by Dr. Ashok Norris. Repeat draw sent to lab STAT with accompanying phone call to lab to  identify specimen as STAT. Pt/spouse aware. KJHenderson,RN

## 2013-07-19 NOTE — H&P (Signed)
[address] [place]  [place], [place] [address]  Phone: [phone]  Fax: [phone]  Date: 06/14/2013  ID: Randall Chen, DOB 11/18/1932, MRN [id number]  PCP: Irven Shelling, MD  Cardiologist: Fransico Him, MD  History of Present Illness:  Randall Chen is a 80 y.o. male with a history of ASCAD s/p inferior MI 2003 with PCI of RCA 6/03 and then PCI of left circ/OM 2/05, dyslpidemia, bicuspid AV, mildly dilated aorta, OSA on CPAP and HTN who presents today for followup. He is doing well. He denies any chest pain, SOB, DOE (except walking up stairs), LE edema, dizziness, palpitations or syncope. He tolerates his CPAP well. He feels rested when he gets up and takes a 30 minutes nap daily because he only gets 6 hours of sleep nightly. He tolerates his mask and feels the pressure is adequate.  Wt Readings from Last 3 Encounters:   01/16/09  242 lb (109.77 kg)   12/21/08  249 lb (112.946 kg)    Past Medical History   Diagnosis  Date   .  Coronary disease      2 Vessel,inferior wall MI june 2003, PTCA and Stent RCA June 2003, PTCA stent, Left Circumflex OM 02 Mar 2003, Dr Radford Pax   .  Dyslipidemia    .  Prostate cancer      /BPH, radioactive seed 2007, [place]   .  History of echocardiogram  08/2010     Bicuspid aortic valve w moderate aortic valve sclerosis,mild AS and mildly dilated aorta   .  OSA (obstructive sleep apnea)      presented with HA and drowsiness,mild sleep apnea with oxygen desats to 78%   .  Hypertension    .  Obesity    .  DJD (degenerative joint disease) of knee      LEFT   .  History of rotator cuff syndrome      (Right) with biceps tendinitis   .  GERD (gastroesophageal reflux disease)    .  Aortic stenosis      mild by echo 02/2012   .  Dilated aortic root     Current Outpatient Prescriptions   Medication  Sig  Dispense  Refill   .  aspirin 81 MG tablet  Take 81 mg by mouth daily.     .  diphenhydrAMINE (BENADRYL) 25 MG tablet  Take 25 mg by mouth at  bedtime as needed.     .  docusate sodium (STOOL SOFTENER) 100 MG capsule  Take 100 mg by mouth daily.     Marland Kitchen  ezetimibe (ZETIA) 10 MG tablet  Take 1 tablet (10 mg total) by mouth daily.  90 tablet  3   .  loratadine (CLARITIN) 10 MG tablet  Take 10 mg by mouth daily as needed for allergies.     .  metoprolol tartrate (LOPRESSOR) 25 MG tablet  Take 25 mg by mouth 2 (two) times daily.     .  Multiple Vitamin (MULTIVITAMIN) tablet  Take 1 tablet by mouth daily.     .  nitroGLYCERIN (NITROSTAT) 0.4 MG SL tablet  Place 0.4 mg under the tongue every 5 (five) minutes as needed for chest pain.     .  Omega-3 Fatty Acids (FISH OIL) 1000 MG CAPS  Take 2,000 mg by mouth daily.     Marland Kitchen  omeprazole (PRILOSEC) 20 MG capsule  Take 20 mg by mouth daily.     .  rosuvastatin (  CRESTOR) 5 MG tablet  Take 1 tablet (5 mg total) by mouth daily.  90 tablet  3   .  tamsulosin (FLOMAX) 0.4 MG CAPS capsule  Take 0.4 mg by mouth daily.     Marland Kitchen  zolpidem (AMBIEN) 5 MG tablet  Take 5 mg by mouth at bedtime as needed for sleep (1/2 tablet -1 tablet as needed at bedtime).      No current facility-administered medications for this visit.   Allergies:  Allergies   Allergen  Reactions   .  Prednisone      REACTION: anxiety reaction   .  Zocor [Simvastatin]      Muscle aches   .  Oxycodone Hcl  Rash   Social History: The patient reports that he quit smoking about 55 years ago. He does not have any smokeless tobacco history on file. He reports that he drinks alcohol. He reports that he does not use illicit drugs.  Family History: The patient's family history includes Coronary artery disease in his brother and brother; Diabetes in his mother; Liver cancer in his brother; Peripheral vascular disease in his father.  ROS: Please see the history of present illness. All other systems reviewed and negative.  PHYSICAL EXAM:  VS: BP 122/74  Pulse 64  SpO2 99%  Well nourished, well developed, in no acute distress  HEENT: normal  Neck:  no JVD  Cardiac: normal S1, S2; RRR; no murmur  Lungs: clear to auscultation bilaterally, no wheezing, rhonchi or rales  Abd: soft, nontender, no hepatomegaly  Ext: no edema  Skin: warm and dry  Neuro: CNs 2-12 intact, no focal abnormalities noted  EKG: Atrial fibrillation with SVR at 57bpm  ASSESSMENT AND PLAN:  1. ASCAD with no angina - continue ASA/metoprolol  2. HTN well controlled - continue metoprolol  3. Dyslipidemia - lipids at goal a year ago - continue crestor/zetia  - check fasting NMR panel and ALT  4. OSA on CPAP and tolerating well. His download today showed an AHIo f 2.5/hr on 7cm H2O and 86% compliance in using more than 4 hours nightly.  5. Mild AS  6. Mildly dilated aortic root - recheck 2D echo  7. New onset atrial fibrillation with SVR of unknown duration  - start Coumadin 5 mg daily - he is not a candidate for NOAC agents due to valvular heart disease with bicuspid AV and mild AS  - Coumadin clinic on Thursday  - Once INR has been >2 for 4 weeks will plan DCCV  Followup with me in 5 weeks  Signed,  Fransico Him, MD  06/14/2013 9:01 AM

## 2013-07-19 NOTE — Anesthesia Preprocedure Evaluation (Addendum)
Anesthesia Evaluation  Patient identified by MRN, date of birth, ID band Patient awake    Reviewed: Allergy & Precautions, H&P , NPO status , Patient's Chart, lab work & pertinent test results  History of Anesthesia Complications Negative for: history of anesthetic complications  Airway Mallampati: II  Neck ROM: full    Dental   Pulmonary sleep apnea , former smoker,          Cardiovascular hypertension, + CAD and + Peripheral Vascular Disease + Valvular Problems/Murmurs AS  07/05/13 Echo: mild AS, AR EF 60-65%   Neuro/Psych  Headaches,    GI/Hepatic GERD-  Medicated,  Endo/Other  obese  Renal/GU      Musculoskeletal   Abdominal   Peds  Hematology   Anesthesia Other Findings   Reproductive/Obstetrics                         Anesthesia Physical Anesthesia Plan  ASA: III  Anesthesia Plan: MAC   Post-op Pain Management:    Induction: Intravenous  Airway Management Planned: Mask and Natural Airway  Additional Equipment:   Intra-op Plan:   Post-operative Plan:   Informed Consent: I have reviewed the patients History and Physical, chart, labs and discussed the procedure including the risks, benefits and alternatives for the proposed anesthesia with the patient or authorized representative who has indicated his/her understanding and acceptance.   Dental advisory given  Plan Discussed with: Anesthesiologist, Surgeon and CRNA  Anesthesia Plan Comments:         Anesthesia Quick Evaluation

## 2013-07-19 NOTE — Interval H&P Note (Signed)
History and Physical Interval Note:  07/19/2013 7:58 AM  Randall Chen  has presented today for surgery, with the diagnosis of a fib   The various methods of treatment have been discussed with the patient and family. After consideration of risks, benefits and other options for treatment, the patient has consented to  Procedure(s): CARDIOVERSION (N/A) as a surgical intervention .  The patient's history has been reviewed, patient examined, no change in status, stable for surgery.  I have reviewed the patient's chart and labs.  Questions were answered to the patient's satisfaction.     TURNER,TRACI R

## 2013-07-26 ENCOUNTER — Ambulatory Visit (INDEPENDENT_AMBULATORY_CARE_PROVIDER_SITE_OTHER): Payer: Commercial Managed Care - HMO | Admitting: *Deleted

## 2013-07-26 ENCOUNTER — Ambulatory Visit: Payer: Commercial Managed Care - HMO | Admitting: Cardiology

## 2013-07-26 DIAGNOSIS — I4891 Unspecified atrial fibrillation: Secondary | ICD-10-CM

## 2013-07-26 DIAGNOSIS — Z5181 Encounter for therapeutic drug level monitoring: Secondary | ICD-10-CM

## 2013-07-26 LAB — POCT INR: INR: 4.1

## 2013-07-26 MED ORDER — WARFARIN SODIUM 5 MG PO TABS: 5.0000 mg | ORAL_TABLET | ORAL | Status: DC

## 2013-07-26 MED ORDER — NITROGLYCERIN 0.4 MG SL SUBL: 0.4000 mg | SUBLINGUAL_TABLET | SUBLINGUAL | Status: DC | PRN

## 2013-08-03 ENCOUNTER — Ambulatory Visit (INDEPENDENT_AMBULATORY_CARE_PROVIDER_SITE_OTHER): Payer: Commercial Managed Care - HMO | Admitting: *Deleted

## 2013-08-03 DIAGNOSIS — Z5181 Encounter for therapeutic drug level monitoring: Secondary | ICD-10-CM

## 2013-08-03 DIAGNOSIS — I4891 Unspecified atrial fibrillation: Secondary | ICD-10-CM

## 2013-08-03 LAB — POCT INR: INR: 3.3

## 2013-08-10 ENCOUNTER — Ambulatory Visit (INDEPENDENT_AMBULATORY_CARE_PROVIDER_SITE_OTHER): Payer: Commercial Managed Care - HMO

## 2013-08-10 DIAGNOSIS — Z5181 Encounter for therapeutic drug level monitoring: Secondary | ICD-10-CM

## 2013-08-10 DIAGNOSIS — I4891 Unspecified atrial fibrillation: Secondary | ICD-10-CM

## 2013-08-10 LAB — POCT INR: INR: 3.6

## 2013-08-18 ENCOUNTER — Ambulatory Visit (INDEPENDENT_AMBULATORY_CARE_PROVIDER_SITE_OTHER): Payer: Commercial Managed Care - HMO | Admitting: *Deleted

## 2013-08-18 ENCOUNTER — Encounter: Payer: Self-pay | Admitting: Physician Assistant

## 2013-08-18 ENCOUNTER — Ambulatory Visit (INDEPENDENT_AMBULATORY_CARE_PROVIDER_SITE_OTHER): Payer: Commercial Managed Care - HMO | Admitting: Physician Assistant

## 2013-08-18 ENCOUNTER — Encounter: Payer: Self-pay | Admitting: General Surgery

## 2013-08-18 VITALS — BP 110/70 | HR 73 | Ht 74.0 in | Wt 236.0 lb

## 2013-08-18 DIAGNOSIS — I77810 Thoracic aortic ectasia: Secondary | ICD-10-CM

## 2013-08-18 DIAGNOSIS — I4891 Unspecified atrial fibrillation: Secondary | ICD-10-CM

## 2013-08-18 DIAGNOSIS — Z5181 Encounter for therapeutic drug level monitoring: Secondary | ICD-10-CM

## 2013-08-18 DIAGNOSIS — I350 Nonrheumatic aortic (valve) stenosis: Secondary | ICD-10-CM

## 2013-08-18 DIAGNOSIS — I2510 Atherosclerotic heart disease of native coronary artery without angina pectoris: Secondary | ICD-10-CM

## 2013-08-18 DIAGNOSIS — I4819 Other persistent atrial fibrillation: Secondary | ICD-10-CM

## 2013-08-18 DIAGNOSIS — E785 Hyperlipidemia, unspecified: Secondary | ICD-10-CM

## 2013-08-18 DIAGNOSIS — I10 Essential (primary) hypertension: Secondary | ICD-10-CM

## 2013-08-18 DIAGNOSIS — I359 Nonrheumatic aortic valve disorder, unspecified: Secondary | ICD-10-CM

## 2013-08-18 DIAGNOSIS — G4733 Obstructive sleep apnea (adult) (pediatric): Secondary | ICD-10-CM

## 2013-08-18 LAB — POCT INR: INR: 4

## 2013-08-18 NOTE — Patient Instructions (Signed)
Your physician recommends that you continue on your current medications as directed. Please refer to the Current Medication list given to you today.  Your physician has recommended that you have a Cardioversion (DCCV). Electrical Cardioversion uses a jolt of electricity to your heart either through paddles or wired patches attached to your chest. This is a controlled, usually prescheduled, procedure. Defibrillation is done under light anesthesia in the hospital, and you usually go home the day of the procedure. This is done to get your heart back into a normal rhythm. You are not awake for the procedure. Please see the instruction sheet given to you today. ( this is scheduled for 08/27/13 at 9:00 am)

## 2013-08-18 NOTE — Progress Notes (Signed)
Cardiology Office Note    Date:  08/18/2013   ID:  Randall Chen, DOB 1932/12/28, MRN [id number]  PCP:  Randall Shelling, MD  Cardiologist:  Dr. Fransico Chen      History of Present Illness: Randall Chen is a 80 y.o. male with a history of CAD status post inferior MI in 2003 with PCI of the RCA and PCI of the circumflex 03/2003, HL, bicuspid aortic without, mild aortic dilatation, sleep apnea on CPAP and HTN. He was recently seen by Randall Chen in 05/2013 for followup. He was noted to be in atrial fibrillation of unknown duration. He was placed on Coumadin with plans for cardioversion after 4 weeks of therapeutic anticoagulation. He tracks he presented to the hospital but had an INR of 1.9. He has been followed closely in the Coumadin clinic of the last several weeks. He has had a therapeutic INR for 3 weeks now. The patient denies any chest pain, significant dyspnea, syncope, orthopnea, PND, edema.   Studies:  - LHC (03/09/03):  65%, distal left main 20%, LAD 20-30%, ostial circumflex 50%, proximal circumflex 80%, proximal RCA 50%, proximal PDA 50% >>> PCI:  DES to the circumflex marginal  - Echo (07/05/13):  Mild LVH, EF 60-65%, no RWMA, mild AS (mean 6 mmHg), mild MR, mod LAE, mild RVE, mild RAE, mild dilated aortic root 40 mm  - Nuclear (07/2008):  Inferior attenuation, no ischemia or scar, EF 72%  - Carotid US (10/2009):  bilat ICA < 50%   Recent Labs: 06/17/2013: ALT 26; LDL (calc) 44  07/15/2013: Creatinine 1.1; Hemoglobin 14.4; Potassium 4.4   Wt Readings from Last 3 Encounters:  08/18/13 236 lb (107.049 kg)  07/19/13 235 lb (106.595 kg)  07/19/13 235 lb (106.595 kg)     Past Medical History  Diagnosis Date  . Coronary disease     2 Vessel,inferior wall MI june 2003, PTCA and Stent RCA June 2003, PTCA stent, Left Circumflex OM 02 Mar 2003, Dr Radford Chen  . Dyslipidemia   . Prostate cancer     /BPH, radioactive seed 2007, Nesi   . History of echocardiogram 08/2010   Bicuspid aortic valve w moderate aortic valve sclerosis,mild AS and mildly dilated aorta   . OSA (obstructive sleep apnea)     presented with HA and drowsiness,mild sleep apnea with oxygen desats to 78%  . Hypertension   . Obesity   . DJD (degenerative joint disease) of knee     LEFT  . History of rotator cuff syndrome     (Right) with biceps tendinitis  . GERD (gastroesophageal reflux disease)   . Aortic stenosis     mild by echo 02/2012  . Dilated aortic root   . Dysrhythmia     afib    Current Outpatient Prescriptions  Medication Sig Dispense Refill  . diphenhydrAMINE (BENADRYL) 25 MG tablet Take 25 mg by mouth at bedtime as needed for sleep.       Marland Kitchen docusate sodium (STOOL SOFTENER) 100 MG capsule Take 100 mg by mouth at bedtime.       Marland Kitchen ezetimibe (ZETIA) 10 MG tablet Take 1 tablet (10 mg total) by mouth daily.  90 tablet  3  . loratadine (CLARITIN) 10 MG tablet Take 10 mg by mouth daily as needed for allergies.      . metoprolol tartrate (LOPRESSOR) 25 MG tablet Take 25 mg by mouth 2 (two) times daily.      . Multiple Vitamin (MULTIVITAMIN) tablet Take 1  tablet by mouth daily.      . nitroGLYCERIN (NITROSTAT) 0.4 MG SL tablet Place 1 tablet (0.4 mg total) under the tongue every 5 (five) minutes as needed for chest pain.  25 tablet  3  . Omega-3 Fatty Acids (FISH OIL) 1000 MG CAPS Take 2,000 mg by mouth daily.      Marland Kitchen omeprazole (PRILOSEC) 20 MG capsule Take 20 mg by mouth daily.      . rosuvastatin (CRESTOR) 5 MG tablet Take 1 tablet (5 mg total) by mouth daily.  90 tablet  3  . tamsulosin (FLOMAX) 0.4 MG CAPS capsule Take 0.4 mg by mouth daily.      Marland Kitchen warfarin (COUMADIN) 5 MG tablet Take 1 tablet (5 mg total) by mouth as directed.  30 tablet  3  . zolpidem (AMBIEN) 5 MG tablet Take 5 mg by mouth at bedtime as needed for sleep (1/2 tablet -1 tablet as needed at bedtime).       No current facility-administered medications for this visit.   Facility-Administered Medications Ordered  in Other Visits  Medication Dose Route Frequency Provider Last Rate Last Dose  . 0.9 %  sodium chloride infusion    Continuous PRN Ned Grace, CRNA        Allergies:   Prednisone; Zocor; and Oxycodone hcl   Social History:  The patient  reports that he quit smoking about 55 years ago. He does not have any smokeless tobacco history on file. He reports that he drinks alcohol. He reports that he does not use illicit drugs.   Family History:  The patient's family history includes Cancer in his brother; Coronary artery disease in his brother and brother; Diabetes in his mother; Fainting in his brother; Heart attack in his brother and father; Liver cancer in his brother; Peripheral vascular disease in his father; Sudden death in his brother.   ROS:  Please see the history of present illness.       All other systems reviewed and negative.   PHYSICAL EXAM: VS:  BP 110/70  Pulse 73  Ht 6\' 2"  (1.88 m)  Wt 236 lb (107.049 kg)  BMI 30.29 kg/m2 Well nourished, well developed, in no acute distress HEENT: normal Neck:  no JVD Cardiac:  normal S1, S2; irregularly irregular rhythm; no murmur Lungs:   clear to auscultation bilaterally, no wheezing, rhonchi or rales Abd: soft, nontender, no hepatomegaly Ext:  no edema Skin: warm and dry Neuro:  CNs 2-12 intact, no focal abnormalities noted  EKG:  Atrial fibrillation, HR 73     ASSESSMENT AND PLAN:  Persistent atrial fibrillation He remains in atrial fibrillation. For the most part he is probably asymptomatic. INRs have been therapeutic for 3 weeks. I discussed his case today with Randall Chen. She prefers that he be therapeutic for a full 4 weeks prior to DCCV. Cardioversion will be arranged next week. He will come in for an INR prior to his procedure.  Atherosclerosis of native coronary artery of native heart without angina pectoris No angina. He is not on aspirin as he is on Coumadin. Continue statin, beta blocker.  Aortic  stenosis Mild by recent echocardiogram.  Dilated aortic root Mild dilatation by recent echocardiogram.  Essential hypertension, benign Controlled.  Hyperlipidemia Continue statin.  Obstructive sleep apnea     Continue CPAP. OSA likely contributes to his atrial fibrillation and biatrial enlargement.   Disposition:  Proceed with cardioversion next week.   Signed, Versie Starks, MHS 08/18/2013 12:18 PM  [facility] [place], [place],   [address] Phone: [phone]; Fax: [phone]

## 2013-08-18 NOTE — H&P (Signed)
History and Physical    Date:  08/18/2013   ID:  JUANPABLO Chen, DOB 1932-07-13, MRN [id number]  PCP:  Irven Shelling, MD  Cardiologist:  Dr. Fransico Him      History of Present Illness: Randall Chen is a 80 y.o. male with a history of CAD status post inferior MI in 2003 with PCI of the RCA and PCI of the circumflex 03/2003, HL, bicuspid aortic without, mild aortic dilatation, sleep apnea on CPAP and HTN. He was recently seen by Dr. Radford Pax in 05/2013 for followup. He was noted to be in atrial fibrillation of unknown duration. He was placed on Coumadin with plans for cardioversion after 4 weeks of therapeutic anticoagulation. He tracks he presented to the hospital but had an INR of 1.9. He has been followed closely in the Coumadin clinic of the last several weeks. He has had a therapeutic INR for 3 weeks now. The patient denies any chest pain, significant dyspnea, syncope, orthopnea, PND, edema.   Studies:  - LHC (03/09/03):  65%, distal left main 20%, LAD 20-30%, ostial circumflex 50%, proximal circumflex 80%, proximal RCA 50%, proximal PDA 50% >>> PCI:  DES to the circumflex marginal  - Echo (07/05/13):  Mild LVH, EF 60-65%, no RWMA, mild AS (mean 6 mmHg), mild MR, mod LAE, mild RVE, mild RAE, mild dilated aortic root 40 mm  - Nuclear (07/2008):  Inferior attenuation, no ischemia or scar, EF 72%  - Carotid US (10/2009):  bilat ICA < 50%   Recent Labs: 06/17/2013: ALT 26; LDL (calc) 44  07/15/2013: Creatinine 1.1; Hemoglobin 14.4; Potassium 4.4   Wt Readings from Last 3 Encounters:  08/18/13 236 lb (107.049 kg)  07/19/13 235 lb (106.595 kg)  07/19/13 235 lb (106.595 kg)     Past Medical History  Diagnosis Date  . Coronary disease     2 Vessel,inferior wall MI june 2003, PTCA and Stent RCA June 2003, PTCA stent, Left Circumflex OM 02 Mar 2003, Dr Radford Pax  . Dyslipidemia   . Prostate cancer     /BPH, radioactive seed 2007, Nesi   . History of echocardiogram 08/2010    Bicuspid  aortic valve w moderate aortic valve sclerosis,mild AS and mildly dilated aorta   . OSA (obstructive sleep apnea)     presented with HA and drowsiness,mild sleep apnea with oxygen desats to 78%  . Hypertension   . Obesity   . DJD (degenerative joint disease) of knee     LEFT  . History of rotator cuff syndrome     (Right) with biceps tendinitis  . GERD (gastroesophageal reflux disease)   . Aortic stenosis     mild by echo 02/2012  . Dilated aortic root   . Dysrhythmia     afib    Current Outpatient Prescriptions  Medication Sig Dispense Refill  . diphenhydrAMINE (BENADRYL) 25 MG tablet Take 25 mg by mouth at bedtime as needed for sleep.       Marland Kitchen docusate sodium (STOOL SOFTENER) 100 MG capsule Take 100 mg by mouth at bedtime.       Marland Kitchen ezetimibe (ZETIA) 10 MG tablet Take 1 tablet (10 mg total) by mouth daily.  90 tablet  3  . loratadine (CLARITIN) 10 MG tablet Take 10 mg by mouth daily as needed for allergies.      . metoprolol tartrate (LOPRESSOR) 25 MG tablet Take 25 mg by mouth 2 (two) times daily.      . Multiple Vitamin (MULTIVITAMIN) tablet  Take 1 tablet by mouth daily.      . nitroGLYCERIN (NITROSTAT) 0.4 MG SL tablet Place 1 tablet (0.4 mg total) under the tongue every 5 (five) minutes as needed for chest pain.  25 tablet  3  . Omega-3 Fatty Acids (FISH OIL) 1000 MG CAPS Take 2,000 mg by mouth daily.      Marland Kitchen omeprazole (PRILOSEC) 20 MG capsule Take 20 mg by mouth daily.      . rosuvastatin (CRESTOR) 5 MG tablet Take 1 tablet (5 mg total) by mouth daily.  90 tablet  3  . tamsulosin (FLOMAX) 0.4 MG CAPS capsule Take 0.4 mg by mouth daily.      Marland Kitchen warfarin (COUMADIN) 5 MG tablet Take 1 tablet (5 mg total) by mouth as directed.  30 tablet  3  . zolpidem (AMBIEN) 5 MG tablet Take 5 mg by mouth at bedtime as needed for sleep (1/2 tablet -1 tablet as needed at bedtime).       No current facility-administered medications for this visit.   Facility-Administered Medications Ordered in Other  Visits  Medication Dose Route Frequency Provider Last Rate Last Dose  . 0.9 %  sodium chloride infusion    Continuous PRN Ned Grace, CRNA        Allergies:   Prednisone; Zocor; and Oxycodone hcl   Social History:  The patient  reports that he quit smoking about 55 years ago. He does not have any smokeless tobacco history on file. He reports that he drinks alcohol. He reports that he does not use illicit drugs.   Family History:  The patient's family history includes Cancer in his brother; Coronary artery disease in his brother and brother; Diabetes in his mother; Fainting in his brother; Heart attack in his brother and father; Liver cancer in his brother; Peripheral vascular disease in his father; Sudden death in his brother.   ROS:  Please see the history of present illness.       All other systems reviewed and negative.   PHYSICAL EXAM: VS:  BP 110/70  Pulse 73  Ht 6\' 2"  (1.88 m)  Wt 236 lb (107.049 kg)  BMI 30.29 kg/m2 Well nourished, well developed, in no acute distress HEENT: normal Neck:  no JVD Cardiac:  normal S1, S2; irregularly irregular rhythm; no murmur Lungs:   clear to auscultation bilaterally, no wheezing, rhonchi or rales Abd: soft, nontender, no hepatomegaly Ext:  no edema Skin: warm and dry Neuro:  CNs 2-12 intact, no focal abnormalities noted  EKG:  Atrial fibrillation, HR 73     ASSESSMENT AND PLAN:  Persistent atrial fibrillation He remains in atrial fibrillation. For the most part he is probably asymptomatic. INRs have been therapeutic for 3 weeks. I discussed his case today with Dr. Radford Pax. She prefers that he be therapeutic for a full 4 weeks prior to DCCV. Cardioversion will be arranged next week. He will come in for an INR prior to his procedure.  Atherosclerosis of native coronary artery of native heart without angina pectoris No angina. He is not on aspirin as he is on Coumadin. Continue statin, beta blocker.  Aortic stenosis Mild by  recent echocardiogram.  Dilated aortic root Mild dilatation by recent echocardiogram.  Essential hypertension, benign Controlled.  Hyperlipidemia Continue statin.  Obstructive sleep apnea     Continue CPAP. OSA likely contributes to his atrial fibrillation and biatrial enlargement.   Disposition:  Proceed with cardioversion next week.   Signed, Richardson Dopp, PA-C, MHS 08/18/2013  12:18 PM    [facility] [place], [place], [place]  [address] Phone: [phone]; Fax: [phone]

## 2013-08-19 NOTE — H&P (Signed)
Agree with note as outlined by Richardson Dopp, PA-C

## 2013-08-24 ENCOUNTER — Other Ambulatory Visit: Payer: Self-pay | Admitting: Cardiology

## 2013-08-24 ENCOUNTER — Encounter (HOSPITAL_COMMUNITY): Payer: Self-pay | Admitting: Pharmacy Technician

## 2013-08-26 ENCOUNTER — Encounter: Payer: Self-pay | Admitting: General Surgery

## 2013-08-26 ENCOUNTER — Ambulatory Visit (INDEPENDENT_AMBULATORY_CARE_PROVIDER_SITE_OTHER): Payer: Commercial Managed Care - HMO | Admitting: Pharmacist

## 2013-08-26 DIAGNOSIS — I4891 Unspecified atrial fibrillation: Secondary | ICD-10-CM

## 2013-08-26 DIAGNOSIS — Z5181 Encounter for therapeutic drug level monitoring: Secondary | ICD-10-CM

## 2013-08-26 LAB — BASIC METABOLIC PANEL
BUN: 18 mg/dL (ref 6–23)
CO2: 25 mEq/L (ref 19–32)
Calcium: 9 mg/dL (ref 8.4–10.5)
Chloride: 109 mEq/L (ref 96–112)
Creatinine, Ser: 1.1 mg/dL (ref 0.4–1.5)
GFR: 67.24 mL/min (ref >60.00)
Glucose, Bld: 111 mg/dL — ABNORMAL HIGH (ref 70–99)
Potassium: 4.3 mEq/L (ref 3.5–5.1)
Sodium: 140 mEq/L (ref 135–145)

## 2013-08-26 LAB — CBC
HCT: 41.5 % (ref 39.0–52.0)
Hemoglobin: 13.8 g/dL (ref 13.0–17.0)
MCHC: 33.3 g/dL (ref 30.0–36.0)
MCV: 91.4 fl (ref 78.0–100.0)
Platelets: 131 10*3/uL — ABNORMAL LOW (ref 150.0–400.0)
RBC: 4.53 Mil/uL (ref 4.22–5.81)
RDW: 14 % (ref 11.5–15.5)
WBC: 5.9 10*3/uL (ref 4.0–10.5)

## 2013-08-26 LAB — POCT INR: INR: 3

## 2013-08-27 ENCOUNTER — Encounter (HOSPITAL_COMMUNITY): Payer: Self-pay | Admitting: *Deleted

## 2013-08-27 ENCOUNTER — Telehealth: Payer: Self-pay | Admitting: Cardiology

## 2013-08-27 ENCOUNTER — Encounter (HOSPITAL_COMMUNITY): Payer: Medicare PPO | Admitting: Anesthesiology

## 2013-08-27 ENCOUNTER — Ambulatory Visit (HOSPITAL_COMMUNITY): Payer: Medicare PPO | Admitting: Anesthesiology

## 2013-08-27 ENCOUNTER — Encounter (HOSPITAL_COMMUNITY)
Admission: RE | Disposition: A | Discharge status: Home / Self Care | Payer: Self-pay | Source: Ambulatory Visit | Attending: Cardiology

## 2013-08-27 ENCOUNTER — Ambulatory Visit (HOSPITAL_COMMUNITY)
Admission: RE | Admit: 2013-08-27 | Discharge: 2013-08-27 | Disposition: A | Discharge status: Home / Self Care | Payer: Medicare PPO | Source: Ambulatory Visit | Attending: Cardiology | Admitting: Cardiology

## 2013-08-27 DIAGNOSIS — K219 Gastro-esophageal reflux disease without esophagitis: Secondary | ICD-10-CM | POA: Insufficient documentation

## 2013-08-27 DIAGNOSIS — Z6830 Body mass index (BMI) 30.0-30.9, adult: Secondary | ICD-10-CM | POA: Insufficient documentation

## 2013-08-27 DIAGNOSIS — Z7901 Long term (current) use of anticoagulants: Secondary | ICD-10-CM | POA: Diagnosis not present

## 2013-08-27 DIAGNOSIS — E669 Obesity, unspecified: Secondary | ICD-10-CM | POA: Insufficient documentation

## 2013-08-27 DIAGNOSIS — I4891 Unspecified atrial fibrillation: Secondary | ICD-10-CM | POA: Diagnosis present

## 2013-08-27 DIAGNOSIS — Z8546 Personal history of malignant neoplasm of prostate: Secondary | ICD-10-CM | POA: Diagnosis not present

## 2013-08-27 DIAGNOSIS — I4819 Other persistent atrial fibrillation: Secondary | ICD-10-CM

## 2013-08-27 DIAGNOSIS — I2510 Atherosclerotic heart disease of native coronary artery without angina pectoris: Secondary | ICD-10-CM | POA: Diagnosis not present

## 2013-08-27 DIAGNOSIS — I252 Old myocardial infarction: Secondary | ICD-10-CM | POA: Insufficient documentation

## 2013-08-27 DIAGNOSIS — Z87891 Personal history of nicotine dependence: Secondary | ICD-10-CM | POA: Insufficient documentation

## 2013-08-27 DIAGNOSIS — G4733 Obstructive sleep apnea (adult) (pediatric): Secondary | ICD-10-CM | POA: Insufficient documentation

## 2013-08-27 DIAGNOSIS — I10 Essential (primary) hypertension: Secondary | ICD-10-CM | POA: Insufficient documentation

## 2013-08-27 DIAGNOSIS — I350 Nonrheumatic aortic (valve) stenosis: Secondary | ICD-10-CM

## 2013-08-27 HISTORY — PX: CARDIOVERSION: SHX1299

## 2013-08-27 SURGERY — CARDIOVERSION
Anesthesia: Monitor Anesthesia Care

## 2013-08-27 MED ORDER — SODIUM CHLORIDE 0.9 % IV SOLN
INTRAVENOUS | Status: DC
  Administered 2013-08-27: 08:00:00 via INTRAVENOUS

## 2013-08-27 MED ORDER — LIDOCAINE HCL (CARDIAC) 20 MG/ML IV SOLN
INTRAVENOUS | Status: DC | PRN
  Administered 2013-08-27: 60 mg via INTRAVENOUS

## 2013-08-27 MED ORDER — PROPOFOL 10 MG/ML IV BOLUS
INTRAVENOUS | Status: DC | PRN
  Administered 2013-08-27: 40 mg via INTRAVENOUS
  Administered 2013-08-27: 20 mg via INTRAVENOUS

## 2013-08-27 NOTE — Anesthesia Preprocedure Evaluation (Signed)
Anesthesia Evaluation  Patient identified by MRN, date of birth, ID band Patient awake    Reviewed: Allergy & Precautions, H&P , NPO status , Patient's Chart, lab work & pertinent test results, reviewed documented beta blocker date and time   Airway Mallampati: II TM Distance: >3 FB Neck ROM: Full    Dental  (+) Teeth Intact   Pulmonary sleep apnea and Continuous Positive Airway Pressure Ventilation , former smoker,          Cardiovascular hypertension, + CAD and + Peripheral Vascular Disease + dysrhythmias Atrial Fibrillation Rhythm:Irregular     Neuro/Psych  Headaches,    GI/Hepatic GERD-  ,  Endo/Other    Renal/GU      Musculoskeletal   Abdominal   Peds  Hematology   Anesthesia Other Findings   Reproductive/Obstetrics                           Anesthesia Physical Anesthesia Plan  ASA: III  Anesthesia Plan: MAC   Post-op Pain Management:    Induction: Intravenous  Airway Management Planned: Simple Face Mask  Additional Equipment:   Intra-op Plan:   Post-operative Plan:   Informed Consent: I have reviewed the patients History and Physical, chart, labs and discussed the procedure including the risks, benefits and alternatives for the proposed anesthesia with the patient or authorized representative who has indicated his/her understanding and acceptance.     Plan Discussed with: Anesthesiologist and Surgeon  Anesthesia Plan Comments:         Anesthesia Quick Evaluation

## 2013-08-27 NOTE — CV Procedure (Signed)
Electrical Cardioversion Procedure Note Randall Chen [id number] September 26, 1932  Procedure: Electrical Cardioversion Indications:  Atrial Fibrillation  Time Out: Verified patient identification, verified procedure,medications/allergies/relevent history reviewed, required imaging and test results available.  Performed  Procedure Details  The patient was NPO after midnight. Anesthesia was administered at the beside  by Dr.Moser with 60mg  of propofol and 60mg  of IV Lidocaine.  Cardioversion was done with synchronized biphasic defibrillation with AP pads with 150watts.  The patient did not convert to normal sinus rhythm. Cardioversion was done with synchronized biphasic defibrillation with AP pads with 200watts and converted to NSR.The patient tolerated the procedure well   IMPRESSION:  Successful cardioversion of atrial fibrillation    Randall Chen R 08/27/2013, 8:49 AM

## 2013-08-27 NOTE — Interval H&P Note (Signed)
History and Physical Interval Note:  08/27/2013 8:14 AM  Randall Chen  has presented today for surgery, with the diagnosis of AFIB  The various methods of treatment have been discussed with the patient and family. After consideration of risks, benefits and other options for treatment, the patient has consented to  Procedure(s): CARDIOVERSION (N/A) as a surgical intervention .  The patient's history has been reviewed, patient examined, no change in status, stable for surgery.  I have reviewed the patient's chart and labs.  Questions were answered to the patient's satisfaction.     TURNER,TRACI R

## 2013-08-27 NOTE — Anesthesia Postprocedure Evaluation (Signed)
  Anesthesia Post-op Note  Patient: Randall Chen  Procedure(s) Performed: Procedure(s): CARDIOVERSION (N/A)  Patient Location: PACU and Endoscopy Unit  Anesthesia Type:MAC  Level of Consciousness: awake, alert , oriented and patient cooperative  Airway and Oxygen Therapy: Patient Spontanous Breathing and Patient connected to nasal cannula oxygen  Post-op Pain: none  Post-op Assessment: Post-op Vital signs reviewed, Patient's Cardiovascular Status Stable, Respiratory Function Stable, Patent Airway and No signs of Nausea or vomiting  Post-op Vital Signs: Reviewed and stable  Last Vitals:  Filed Vitals:   08/27/13 1010  BP:   Pulse: 56  Temp:   Resp: 15    Complications: No apparent anesthesia complications

## 2013-08-27 NOTE — Anesthesia Postprocedure Evaluation (Signed)
  Anesthesia Post-op Note  Patient: Randall Chen  Procedure(s) Performed: Procedure(s): CARDIOVERSION (N/A)  Patient Location: Endoscopy Unit  Anesthesia Type:MAC  Level of Consciousness: awake  Airway and Oxygen Therapy: Patient Spontanous Breathing  Post-op Pain: none  Post-op Assessment: Post-op Vital signs reviewed  Post-op Vital Signs: Reviewed and stable  Last Vitals:  Filed Vitals:   08/27/13 1010  BP:   Pulse: 56  Temp:   Resp: 15    Complications: No apparent anesthesia complications

## 2013-08-27 NOTE — Discharge Instructions (Signed)

## 2013-08-27 NOTE — Transfer of Care (Signed)
Immediate Anesthesia Transfer of Care Note  Patient: Randall Chen  Procedure(s) Performed: Procedure(s): CARDIOVERSION (N/A)  Patient Location: Endoscopy Unit  Anesthesia Type:General  Level of Consciousness: awake, alert , oriented and patient cooperative  Airway & Oxygen Therapy: Patient Spontanous Breathing and Patient connected to face mask oxygen  Post-op Assessment: Report given to PACU RN, Post -op Vital signs reviewed and stable and Patient moving all extremities  Post vital signs: Reviewed and stable  Complications: No apparent anesthesia complications

## 2013-08-30 ENCOUNTER — Encounter (HOSPITAL_COMMUNITY): Payer: Self-pay | Admitting: Cardiology

## 2013-09-03 ENCOUNTER — Encounter: Payer: Self-pay | Admitting: Cardiology

## 2013-09-06 ENCOUNTER — Ambulatory Visit (INDEPENDENT_AMBULATORY_CARE_PROVIDER_SITE_OTHER): Payer: Commercial Managed Care - HMO | Admitting: *Deleted

## 2013-09-06 DIAGNOSIS — Z5181 Encounter for therapeutic drug level monitoring: Secondary | ICD-10-CM

## 2013-09-06 DIAGNOSIS — I4891 Unspecified atrial fibrillation: Secondary | ICD-10-CM

## 2013-09-06 LAB — POCT INR: INR: 2.5

## 2013-09-10 NOTE — Telephone Encounter (Signed)
error 

## 2013-09-13 ENCOUNTER — Ambulatory Visit: Payer: Commercial Managed Care - HMO | Admitting: Cardiology

## 2013-09-22 ENCOUNTER — Encounter: Payer: Self-pay | Admitting: Cardiology

## 2013-09-22 ENCOUNTER — Ambulatory Visit: Payer: Commercial Managed Care - HMO | Admitting: Cardiology

## 2013-09-22 ENCOUNTER — Ambulatory Visit (INDEPENDENT_AMBULATORY_CARE_PROVIDER_SITE_OTHER): Payer: Commercial Managed Care - HMO | Admitting: Cardiology

## 2013-09-22 ENCOUNTER — Ambulatory Visit (INDEPENDENT_AMBULATORY_CARE_PROVIDER_SITE_OTHER): Payer: Commercial Managed Care - HMO | Admitting: *Deleted

## 2013-09-22 VITALS — BP 98/54 | HR 64 | Ht 72.0 in | Wt 235.0 lb

## 2013-09-22 DIAGNOSIS — I4891 Unspecified atrial fibrillation: Secondary | ICD-10-CM

## 2013-09-22 DIAGNOSIS — Z5181 Encounter for therapeutic drug level monitoring: Secondary | ICD-10-CM | POA: Diagnosis not present

## 2013-09-22 DIAGNOSIS — Z7901 Long term (current) use of anticoagulants: Secondary | ICD-10-CM | POA: Insufficient documentation

## 2013-09-22 DIAGNOSIS — G4733 Obstructive sleep apnea (adult) (pediatric): Secondary | ICD-10-CM

## 2013-09-22 DIAGNOSIS — E785 Hyperlipidemia, unspecified: Secondary | ICD-10-CM

## 2013-09-22 DIAGNOSIS — I10 Essential (primary) hypertension: Secondary | ICD-10-CM

## 2013-09-22 LAB — POCT INR: INR: 2

## 2013-09-22 NOTE — Assessment & Plan Note (Signed)
Compliant with C-pap 

## 2013-09-22 NOTE — Assessment & Plan Note (Signed)
LDL 44 May 2015

## 2013-09-22 NOTE — Assessment & Plan Note (Addendum)
No angina. Low risk Myoview 2010

## 2013-09-22 NOTE — Patient Instructions (Signed)
Your physician recommends that you continue on your current medications as directed. Please refer to the Current Medication list given to you today.   Your physician wants you to follow-up in: [place] will receive a reminder letter in the mail two months in advance. If you don't receive a letter, please call our office to schedule the follow-up appointment.   .AVOID CAFFEINE AS MUCH AS POSSIBLE

## 2013-09-22 NOTE — Assessment & Plan Note (Signed)
DCCV 08/27/13

## 2013-09-22 NOTE — Progress Notes (Signed)
09/22/2013 Randall Chen   Feb 27, 1932  [id number]  Primary Physicia Irven Shelling, MD Primary Cardiologist: Dr Radford Pax  HPI:  80 y/o followed by Dr Radford Pax with a history of CAD. He had an RCA PCI '03 followed by a CFX PCI '05 by Dr Leonia Reeves. Myoview in 2010 was low risk. He has done well since. He saw Dr Radford Pax in May 2015 and was noted to be in AF. He was placed on Coumadin with plans for OP DCCV. Echo revealed mils AS with a peak gradient of 12 mmHg and NL LVF. He had moderate LAE. The pt underwent [facility]  08/27/13 and converted easily to NSR. He is here today for follow up. He says he feels well. He admits he may have some "more energy" than he did in AF.    Current Outpatient Prescriptions  Medication Sig Dispense Refill  . diphenhydrAMINE (BENADRYL) 25 MG tablet Take 25 mg by mouth at bedtime as needed for sleep.       Marland Kitchen docusate sodium (STOOL SOFTENER) 100 MG capsule Take 100 mg by mouth at bedtime.       Marland Kitchen ezetimibe (ZETIA) 10 MG tablet Take 1 tablet (10 mg total) by mouth daily.  90 tablet  3  . loratadine (CLARITIN) 10 MG tablet Take 10 mg by mouth daily as needed for allergies.      . metoprolol tartrate (LOPRESSOR) 25 MG tablet TAKE 1 TABLET BY MOUTH TWICE DAILY  180 tablet  0  . Multiple Vitamin (MULTIVITAMIN) tablet Take 1 tablet by mouth daily.      . nitroGLYCERIN (NITROSTAT) 0.4 MG SL tablet Place 1 tablet (0.4 mg total) under the tongue every 5 (five) minutes as needed for chest pain.  25 tablet  3  . Omega-3 Fatty Acids (FISH OIL) 1000 MG CAPS Take 2,000 mg by mouth daily.      Marland Kitchen omeprazole (PRILOSEC) 20 MG capsule Take 20 mg by mouth daily.      . rosuvastatin (CRESTOR) 5 MG tablet Take 1 tablet (5 mg total) by mouth daily.  90 tablet  3  . tamsulosin (FLOMAX) 0.4 MG CAPS capsule Take 0.4 mg by mouth daily.      Marland Kitchen warfarin (COUMADIN) 5 MG tablet Take 2.5-5 mg by mouth daily. Takes 2.5mg  daily, except takes 5mg  on Mondays and Fridays.      Marland Kitchen zolpidem (AMBIEN) 5 MG  tablet Take 2.5-5 mg by mouth at bedtime as needed for sleep.        No current facility-administered medications for this visit.   Facility-Administered Medications Ordered in Other Visits  Medication Dose Route Frequency Provider Last Rate Last Dose  . 0.9 %  sodium chloride infusion    Continuous PRN Ned Grace, CRNA        Allergies  Allergen Reactions  . Prednisone Other (See Comments)    REACTION: anxiety reaction  . Zocor [Simvastatin] Other (See Comments)    REACTION: Muscle aches  . Oxycodone Hcl Rash    History   Social History  . Marital Status: Married    Spouse Name: N/A    Number of Children: N/A  . Years of Education: N/A   Occupational History  . Not on file.   Social History Main Topics  . Smoking status: Former Smoker    Quit date: 01/28/1958  . Smokeless tobacco: Not on file  . Alcohol Use: Yes     Comment: "a few a week"  . Drug Use: No  .  Sexual Activity: Not on file   Other Topics Concern  . Not on file   Social History Narrative  . No narrative on file     Review of Systems: General: negative for chills, fever, night sweats or weight changes.  Cardiovascular: negative for chest pain, dyspnea on exertion, edema, orthopnea, palpitations, paroxysmal nocturnal dyspnea or shortness of breath Dermatological: negative for rash Respiratory: negative for cough or wheezing Urologic: negative for hematuria Abdominal: negative for nausea, vomiting, diarrhea, bright red blood per rectum, melena, or hematemesis Neurologic: negative for visual changes, syncope, or dizziness All other systems reviewed and are otherwise negative except as noted above.    Blood pressure 98/54, pulse 64, height 6' (1.829 m), weight 235 lb (106.595 kg).  General appearance: alert, cooperative, no distress and mildly obese Neck: no carotid bruit and no JVD Lungs: clear to auscultation bilaterally Heart: regular rate and rhythm  EKG NSR  ASSESSMENT AND  PLAN:   PAF (paroxysmal atrial fibrillation) DCCV 08/27/13  OSA (obstructive sleep apnea) Compliant with C pap  CAD- RCA PCI '03, CFX PCI '05 No angina. Low risk Myoview 2010  Essential hypertension, benign Controlled  Hyperlipidemia LDL 44 May 2015  Chronic anticoagulation Coumadin Rx   PLAN  He admitted to me he drinks 2 cups of coffee in the am daily. I strongly urged him to change to Decaf. He will follow up with Dr Radford Pax in 6 months.   Trenda Corliss KPA-C 09/22/2013 2:15 PM

## 2013-09-22 NOTE — Assessment & Plan Note (Signed)
Coumadin Rx 

## 2013-09-22 NOTE — Assessment & Plan Note (Signed)
Controlled.  

## 2013-10-12 ENCOUNTER — Ambulatory Visit (INDEPENDENT_AMBULATORY_CARE_PROVIDER_SITE_OTHER): Payer: Commercial Managed Care - HMO | Admitting: *Deleted

## 2013-10-12 DIAGNOSIS — Z5181 Encounter for therapeutic drug level monitoring: Secondary | ICD-10-CM

## 2013-10-12 DIAGNOSIS — I4891 Unspecified atrial fibrillation: Secondary | ICD-10-CM

## 2013-10-12 DIAGNOSIS — I480 Paroxysmal atrial fibrillation: Secondary | ICD-10-CM

## 2013-10-12 LAB — POCT INR: INR: 2.1

## 2013-10-12 MED ORDER — WARFARIN SODIUM 5 MG PO TABS: 5.0000 mg | ORAL_TABLET | ORAL | Status: DC

## 2013-10-13 DIAGNOSIS — C4491 Basal cell carcinoma of skin, unspecified: Secondary | ICD-10-CM

## 2013-10-13 HISTORY — DX: Basal cell carcinoma of skin, unspecified: C44.91

## 2013-11-09 ENCOUNTER — Ambulatory Visit (INDEPENDENT_AMBULATORY_CARE_PROVIDER_SITE_OTHER): Payer: Commercial Managed Care - HMO | Admitting: *Deleted

## 2013-11-09 DIAGNOSIS — I480 Paroxysmal atrial fibrillation: Secondary | ICD-10-CM

## 2013-11-09 DIAGNOSIS — I4891 Unspecified atrial fibrillation: Secondary | ICD-10-CM

## 2013-11-09 DIAGNOSIS — Z5181 Encounter for therapeutic drug level monitoring: Secondary | ICD-10-CM

## 2013-11-09 LAB — POCT INR: INR: 2.6

## 2013-11-17 ENCOUNTER — Other Ambulatory Visit: Payer: Self-pay | Admitting: Cardiology

## 2013-12-07 ENCOUNTER — Ambulatory Visit (INDEPENDENT_AMBULATORY_CARE_PROVIDER_SITE_OTHER): Payer: Commercial Managed Care - HMO | Admitting: Pharmacist

## 2013-12-07 DIAGNOSIS — I480 Paroxysmal atrial fibrillation: Secondary | ICD-10-CM

## 2013-12-07 DIAGNOSIS — Z5181 Encounter for therapeutic drug level monitoring: Secondary | ICD-10-CM

## 2013-12-07 DIAGNOSIS — I4891 Unspecified atrial fibrillation: Secondary | ICD-10-CM

## 2013-12-07 LAB — POCT INR: INR: 2.4

## 2014-01-18 ENCOUNTER — Ambulatory Visit (INDEPENDENT_AMBULATORY_CARE_PROVIDER_SITE_OTHER): Payer: Commercial Managed Care - HMO | Admitting: Pharmacist

## 2014-01-18 DIAGNOSIS — I480 Paroxysmal atrial fibrillation: Secondary | ICD-10-CM

## 2014-01-18 DIAGNOSIS — Z5181 Encounter for therapeutic drug level monitoring: Secondary | ICD-10-CM

## 2014-01-18 DIAGNOSIS — I4891 Unspecified atrial fibrillation: Secondary | ICD-10-CM

## 2014-01-18 LAB — POCT INR: INR: 3.1

## 2014-03-01 ENCOUNTER — Ambulatory Visit (INDEPENDENT_AMBULATORY_CARE_PROVIDER_SITE_OTHER): Payer: PPO | Admitting: Cardiology

## 2014-03-01 ENCOUNTER — Encounter: Payer: Self-pay | Admitting: Cardiology

## 2014-03-01 ENCOUNTER — Ambulatory Visit (INDEPENDENT_AMBULATORY_CARE_PROVIDER_SITE_OTHER): Payer: PPO | Admitting: *Deleted

## 2014-03-01 VITALS — BP 134/68 | HR 60 | Ht 73.0 in | Wt 245.8 lb

## 2014-03-01 DIAGNOSIS — I480 Paroxysmal atrial fibrillation: Secondary | ICD-10-CM

## 2014-03-01 DIAGNOSIS — E785 Hyperlipidemia, unspecified: Secondary | ICD-10-CM

## 2014-03-01 DIAGNOSIS — I77810 Thoracic aortic ectasia: Secondary | ICD-10-CM

## 2014-03-01 DIAGNOSIS — I4891 Unspecified atrial fibrillation: Secondary | ICD-10-CM

## 2014-03-01 DIAGNOSIS — I10 Essential (primary) hypertension: Secondary | ICD-10-CM

## 2014-03-01 DIAGNOSIS — G4733 Obstructive sleep apnea (adult) (pediatric): Secondary | ICD-10-CM

## 2014-03-01 DIAGNOSIS — Z5181 Encounter for therapeutic drug level monitoring: Secondary | ICD-10-CM

## 2014-03-01 DIAGNOSIS — I350 Nonrheumatic aortic (valve) stenosis: Secondary | ICD-10-CM

## 2014-03-01 DIAGNOSIS — I2510 Atherosclerotic heart disease of native coronary artery without angina pectoris: Secondary | ICD-10-CM

## 2014-03-01 LAB — POCT INR: INR: 3.4

## 2014-03-01 MED ORDER — ROSUVASTATIN CALCIUM 5 MG PO TABS: 5.0000 mg | ORAL_TABLET | Freq: Every day | ORAL | Status: DC

## 2014-03-01 MED ORDER — EZETIMIBE 10 MG PO TABS: 10.0000 mg | ORAL_TABLET | Freq: Every day | ORAL | Status: DC

## 2014-03-01 NOTE — Telephone Encounter (Signed)
This encounter was created in error - please disregard.

## 2014-03-01 NOTE — Progress Notes (Signed)
Cardiology Office Note   Date:  03/01/2014   ID:  Randall Chen, DOB Dec 03, 1932, MRN [id number]  PCP:  Irven Shelling, MD  Cardiologist:   Sueanne Margarita, MD   Chief Complaint  Patient presents with  . Coronary Artery Disease  . Hypertension  . Sleep Apnea  . Hyperlipidemia      History of Present Illness:  Randall Chen is a 81 y.o. male with a history of ASCAD s/p inferior MI 2003 with PCI of RCA 6/03 and then PCI of left circ/OM 2/05, dyslpidemia, bicuspid AV, mildly dilated aorta, OSA on CPAP and HTN who presents today for followup. He is doing well. He denies any chest pain, SOB, DOE, LE edema, dizziness, palpitations or syncope. He tolerates his CPAP well. He feels rested when he gets up but occasionally takes a 30 minutes nap daily because he only gets 6 hours of sleep nightly. He tolerates his mask and feels the pressure is adequate.  He does not snore. He exercise at the gym.      Past Medical History  Diagnosis Date  . Coronary disease     2 Vessel,inferior wall MI june 2003, PTCA and Stent RCA June 2003, PTCA stent, Left Circumflex OM 02 Mar 2003, Dr Radford Pax  . Dyslipidemia   . Prostate cancer     /BPH, radioactive seed 2007, Nesi   . History of echocardiogram 08/2010    Bicuspid aortic valve w moderate aortic valve sclerosis,mild AS and mildly dilated aorta   . Hypertension   . Obesity   . DJD (degenerative joint disease) of knee     LEFT  . History of rotator cuff syndrome     (Right) with biceps tendinitis  . GERD (gastroesophageal reflux disease)   . Aortic stenosis     mild by echo 02/2012  . Dilated aortic root   . Dysrhythmia     afib  . OSA (obstructive sleep apnea)     presented with HA and drowsiness,mild sleep apnea with oxygen desats to 78%    Past Surgical History  Procedure Laterality Date  . Cardiac catheterization    . Coronary angioplasty    . Joint replacement      left knee replacement 2009  . Cardioversion N/A 08/27/2013     Procedure: CARDIOVERSION;  Surgeon: Sueanne Margarita, MD;  Location: [facility];  Service: Cardiovascular;  Laterality: N/A;     Current Outpatient Prescriptions  Medication Sig Dispense Refill  . diphenhydrAMINE (BENADRYL) 25 MG tablet Take 25 mg by mouth at bedtime as needed for sleep.     Marland Kitchen ezetimibe (ZETIA) 10 MG tablet Take 1 tablet (10 mg total) by mouth daily. 90 tablet 3  . loratadine (CLARITIN) 10 MG tablet Take 10 mg by mouth daily as needed for allergies.    . metoprolol tartrate (LOPRESSOR) 25 MG tablet TAKE 1 TABLET BY MOUTH TWICE DAILY 180 tablet 1  . nitroGLYCERIN (NITROSTAT) 0.4 MG SL tablet Place 1 tablet (0.4 mg total) under the tongue every 5 (five) minutes as needed for chest pain. 25 tablet 3  . Omega-3 Fatty Acids (FISH OIL) 1000 MG CAPS Take 2,000 mg by mouth daily.    Marland Kitchen omeprazole (PRILOSEC) 20 MG capsule Take 20 mg by mouth daily.    . rosuvastatin (CRESTOR) 5 MG tablet Take 1 tablet (5 mg total) by mouth daily. 90 tablet 3  . tamsulosin (FLOMAX) 0.4 MG CAPS capsule Take 0.4 mg by mouth daily.    Marland Kitchen  warfarin (COUMADIN) 5 MG tablet Take 1 tablet (5 mg total) by mouth as directed. 90 tablet 0  . zolpidem (AMBIEN) 5 MG tablet Take 2.5-5 mg by mouth at bedtime as needed for sleep.      No current facility-administered medications for this visit.   Facility-Administered Medications Ordered in Other Visits  Medication Dose Route Frequency Provider Last Rate Last Dose  . 0.9 %  sodium chloride infusion    Continuous PRN Ned Grace, CRNA        Allergies:   Prednisone; Zocor; and Oxycodone hcl    Social History:  The patient  reports that he quit smoking about 56 years ago. He does not have any smokeless tobacco history on file. He reports that he drinks alcohol. He reports that he does not use illicit drugs.   Family History:  The patient's family history includes Cancer in his brother; Coronary artery disease in his brother and brother; Diabetes in  his mother; Fainting in his brother; Heart attack in his brother and father; Liver cancer in his brother; Peripheral vascular disease in his father; Sudden death in his brother.    ROS:  Please see the history of present illness.   Otherwise, review of systems are positive for none.   All other systems are reviewed and negative.    PHYSICAL EXAM: VS:  BP 134/68 mmHg  Pulse 60  Ht 6\' 1"  (1.854 m)  Wt 245 lb 12.8 oz (111.494 kg)  BMI 32.44 kg/m2  SpO2 95% , BMI Body mass index is 32.44 kg/(m^2). GEN: Well nourished, well developed, in no acute distress HEENT: normal Neck: no JVD, carotid bruits, or masses Cardiac: RRR; no  rubs, or gallops,no edema  1/6 SM at RUSB to LLSB Respiratory:  clear to auscultation bilaterally, normal work of breathing GI: soft, nontender, nondistended, + BS MS: no deformity or atrophy Skin: warm and dry, no rash Neuro:  Strength and sensation are intact Psych: euthymic mood, full affect   EKG:  EKG is not ordered today.    Recent Labs: 06/17/2013: ALT 26 08/26/2013: BUN 18; Creatinine 1.1; Hemoglobin 13.8; Platelets 131.0*; Potassium 4.3; Sodium 140    Lipid Panel    Component Value Date/Time   TRIG 75 06/17/2013 0821   LDLCALC 44 06/17/2013 0821      Wt Readings from Last 3 Encounters:  03/01/14 245 lb 12.8 oz (111.494 kg)  09/22/13 235 lb (106.595 kg)  08/18/13 236 lb (107.049 kg)     ASSESSMENT AND PLAN:  Paroxysmal atrial fibrillation s/p DCCV 07/2013 maintaining NSR Continue BB/warfarin  Atherosclerosis of native coronary artery of native heart without angina pectoris No angina. He is not on aspirin as he is on Coumadin. Continue statin, beta blocker.  Mild Aortic stenosis by echo   Dilated aortic root Mild dilatation - repeat echo in June 2016  Essential hypertension, benign Controlled. Continue metoprolol  Hyperlipidemia Continue statin/zetia.  Check FLP and ALT  Obstructive sleep apnea  Continue CPAP. OSA likely  contributes to his atrial fibrillation and biatrial enlargement.  I will get a download from the DME   Current medicines are reviewed at length with the patient today.  The patient does not have concerns regarding medicines.  The following changes have been made:  no change  Labs/ tests ordered today include: FLP and ALT    Disposition:   FU with me in 6 months   Signed, Sueanne Margarita, MD  03/01/2014 1:33 PM    [facility] [place], [address] [place], [place]  [address] Phone: [phone]; Fax: [phone]

## 2014-03-01 NOTE — Patient Instructions (Addendum)
Your physician recommends that you continue on your current medications as directed. Please refer to the Current Medication list given to you today.    RX FOR LABS WRITTEN OR YOU  FROM DR TURNER AND GET LABS   FAXED ATT: DR TURNER    Your physician wants you to follow-up in: 6 MONTHS  You will receive a reminder letter in the mail two months in advance. If you don't receive a letter, please call our office to schedule the follow-up appointment.

## 2014-03-02 ENCOUNTER — Other Ambulatory Visit (INDEPENDENT_AMBULATORY_CARE_PROVIDER_SITE_OTHER): Payer: PPO | Admitting: *Deleted

## 2014-03-02 DIAGNOSIS — E785 Hyperlipidemia, unspecified: Secondary | ICD-10-CM

## 2014-03-02 LAB — LIPID PANEL
Cholesterol: 112 mg/dL (ref 0–200)
HDL: 39.7 mg/dL (ref >39.00)
LDL Cholesterol: 48 mg/dL (ref 0–99)
NonHDL: 72.3
Total CHOL/HDL Ratio: 3
Triglycerides: 121 mg/dL (ref 0.0–149.0)
VLDL: 24.2 mg/dL (ref 0.0–40.0)

## 2014-03-02 LAB — HEPATIC FUNCTION PANEL
ALT: 16 U/L (ref 0–53)
AST: 25 U/L (ref 0–37)
Albumin: 3.8 g/dL (ref 3.5–5.2)
Alkaline Phosphatase: 68 U/L (ref 39–117)
Bilirubin, Direct: 0.1 mg/dL (ref 0.0–0.3)
Total Bilirubin: 0.6 mg/dL (ref 0.2–1.2)
Total Protein: 6.5 g/dL (ref 6.0–8.3)

## 2014-03-02 NOTE — Addendum Note (Signed)
Addended by: Eulis Foster on: 03/02/2014 09:25 AM   Modules accepted: Orders

## 2014-03-03 ENCOUNTER — Telehealth: Payer: Self-pay | Admitting: Physician Assistant

## 2014-03-03 ENCOUNTER — Telehealth: Payer: Self-pay | Admitting: Cardiology

## 2014-03-03 NOTE — Telephone Encounter (Signed)
pt notified of lab results with verbal understanding 

## 2014-03-03 NOTE — Telephone Encounter (Signed)
New Msg         Pt returning call about lab results.   Please call back.

## 2014-03-03 NOTE — Telephone Encounter (Signed)
error 

## 2014-03-30 ENCOUNTER — Ambulatory Visit (INDEPENDENT_AMBULATORY_CARE_PROVIDER_SITE_OTHER): Payer: PPO | Admitting: *Deleted

## 2014-03-30 DIAGNOSIS — I480 Paroxysmal atrial fibrillation: Secondary | ICD-10-CM

## 2014-03-30 DIAGNOSIS — Z5181 Encounter for therapeutic drug level monitoring: Secondary | ICD-10-CM

## 2014-03-30 DIAGNOSIS — I4891 Unspecified atrial fibrillation: Secondary | ICD-10-CM

## 2014-03-30 LAB — POCT INR: INR: 2.9

## 2014-04-21 ENCOUNTER — Other Ambulatory Visit: Payer: Self-pay | Admitting: Cardiology

## 2014-04-27 ENCOUNTER — Ambulatory Visit (INDEPENDENT_AMBULATORY_CARE_PROVIDER_SITE_OTHER): Payer: PPO | Admitting: *Deleted

## 2014-04-27 DIAGNOSIS — I480 Paroxysmal atrial fibrillation: Secondary | ICD-10-CM | POA: Diagnosis not present

## 2014-04-27 DIAGNOSIS — Z5181 Encounter for therapeutic drug level monitoring: Secondary | ICD-10-CM

## 2014-04-27 DIAGNOSIS — I4891 Unspecified atrial fibrillation: Secondary | ICD-10-CM

## 2014-04-27 LAB — POCT INR: INR: 2.1

## 2014-05-24 ENCOUNTER — Encounter: Payer: Self-pay | Admitting: Cardiology

## 2014-05-26 ENCOUNTER — Other Ambulatory Visit: Payer: Self-pay | Admitting: Cardiology

## 2014-06-01 ENCOUNTER — Ambulatory Visit (INDEPENDENT_AMBULATORY_CARE_PROVIDER_SITE_OTHER): Payer: PPO | Admitting: *Deleted

## 2014-06-01 DIAGNOSIS — I480 Paroxysmal atrial fibrillation: Secondary | ICD-10-CM

## 2014-06-01 DIAGNOSIS — I4891 Unspecified atrial fibrillation: Secondary | ICD-10-CM

## 2014-06-01 DIAGNOSIS — Z5181 Encounter for therapeutic drug level monitoring: Secondary | ICD-10-CM

## 2014-06-01 LAB — POCT INR: INR: 1.9

## 2014-06-13 ENCOUNTER — Telehealth: Payer: Self-pay | Admitting: *Deleted

## 2014-06-13 NOTE — Telephone Encounter (Signed)
Pt called and said that he originally got his machine from [facility], but after his last office visit Dr. Radford Pax sent him to [facility] to get a download. He said that he told them he wanted to become a customer there and they had him fill out paperwork and checked his machine and said they would send [place] a download.   I have spoken with Darlina Guys so see what exactly they have from him. 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

## 2014-06-13 NOTE — Telephone Encounter (Signed)
Left message with patient to see if he knows who his DME provider is

## 2014-07-06 ENCOUNTER — Ambulatory Visit (INDEPENDENT_AMBULATORY_CARE_PROVIDER_SITE_OTHER): Payer: PPO | Admitting: *Deleted

## 2014-07-06 DIAGNOSIS — Z5181 Encounter for therapeutic drug level monitoring: Secondary | ICD-10-CM | POA: Diagnosis not present

## 2014-07-06 DIAGNOSIS — I480 Paroxysmal atrial fibrillation: Secondary | ICD-10-CM

## 2014-07-06 LAB — POCT INR: INR: 1.9

## 2014-07-27 ENCOUNTER — Ambulatory Visit (INDEPENDENT_AMBULATORY_CARE_PROVIDER_SITE_OTHER): Payer: PPO | Admitting: *Deleted

## 2014-07-27 DIAGNOSIS — I480 Paroxysmal atrial fibrillation: Secondary | ICD-10-CM

## 2014-07-27 DIAGNOSIS — Z5181 Encounter for therapeutic drug level monitoring: Secondary | ICD-10-CM

## 2014-07-27 LAB — POCT INR: INR: 1.6

## 2014-08-10 ENCOUNTER — Ambulatory Visit (INDEPENDENT_AMBULATORY_CARE_PROVIDER_SITE_OTHER): Payer: PPO | Admitting: *Deleted

## 2014-08-10 DIAGNOSIS — Z5181 Encounter for therapeutic drug level monitoring: Secondary | ICD-10-CM

## 2014-08-10 DIAGNOSIS — I480 Paroxysmal atrial fibrillation: Secondary | ICD-10-CM | POA: Diagnosis not present

## 2014-08-10 LAB — POCT INR: INR: 2.1

## 2014-08-16 ENCOUNTER — Encounter: Payer: Self-pay | Admitting: Cardiology

## 2014-08-18 ENCOUNTER — Other Ambulatory Visit: Payer: Self-pay | Admitting: Cardiology

## 2014-08-31 ENCOUNTER — Ambulatory Visit (INDEPENDENT_AMBULATORY_CARE_PROVIDER_SITE_OTHER): Payer: PPO | Admitting: Pharmacist

## 2014-08-31 DIAGNOSIS — Z5181 Encounter for therapeutic drug level monitoring: Secondary | ICD-10-CM | POA: Diagnosis not present

## 2014-08-31 DIAGNOSIS — I480 Paroxysmal atrial fibrillation: Secondary | ICD-10-CM | POA: Diagnosis not present

## 2014-08-31 LAB — POCT INR: INR: 2.3

## 2014-09-15 ENCOUNTER — Other Ambulatory Visit: Payer: Self-pay | Admitting: Cardiology

## 2014-09-19 NOTE — Progress Notes (Signed)
Cardiology Office Note   Date:  09/20/2014   ID:  Randall Chen, DOB 1932/09/22, MRN [id number]  PCP:  Irven Shelling, MD    Chief Complaint  Patient presents with  . Atherosclerosis      History of Present Illness: Randall Chen is a 81 y.o. male with a history of ASCAD s/p inferior MI 2003 with PCI of RCA 6/03 and then PCI of left circ/OM 2/05, dyslpidemia, bicuspid AV, mildly dilated aorta, OSA on CPAP and HTN who presents today for followup. He is doing well. He denies any chest pain, SOB, DOE, LE edema, dizziness, palpitations or syncope. He tolerates his CPAP well. He feels rested when he gets up but occasionally takes a 30 minutes nap daily because he only gets 6 hours of sleep nightly. He tolerates his nasal pillow mask and feels the pressure is adequate. He does not snore. He exercise at the gym when he is not gardening.      Past Medical History  Diagnosis Date  . Coronary disease     2 Vessel,inferior wall MI june 2003, PTCA and Stent RCA June 2003, PTCA stent, Left Circumflex OM 02 Mar 2003, Dr Radford Pax  . Dyslipidemia   . Prostate cancer     /BPH, radioactive seed 2007, Nesi   . History of echocardiogram 08/2010    Bicuspid aortic valve w moderate aortic valve sclerosis,mild AS and mildly dilated aorta   . Hypertension   . Obesity   . DJD (degenerative joint disease) of knee     LEFT  . History of rotator cuff syndrome     (Right) with biceps tendinitis  . GERD (gastroesophageal reflux disease)   . Aortic stenosis     mild by echo 02/2012  . Dilated aortic root   . Dysrhythmia     afib  . OSA (obstructive sleep apnea)     presented with HA and drowsiness,mild sleep apnea with oxygen desats to 78%    Past Surgical History  Procedure Laterality Date  . Cardiac catheterization    . Coronary angioplasty    . Joint replacement      left knee replacement 2009  . Cardioversion N/A 08/27/2013    Procedure: CARDIOVERSION;  Surgeon:  Sueanne Margarita, MD;  Location: [facility];  Service: Cardiovascular;  Laterality: N/A;     Current Outpatient Prescriptions  Medication Sig Dispense Refill  . diphenhydrAMINE (BENADRYL) 25 MG tablet Take 25 mg by mouth at bedtime as needed for sleep.     Marland Kitchen ezetimibe (ZETIA) 10 MG tablet Take 1 tablet (10 mg total) by mouth daily. 90 tablet 3  . loratadine (CLARITIN) 10 MG tablet Take 10 mg by mouth daily as needed for allergies.    . metoprolol tartrate (LOPRESSOR) 25 MG tablet TAKE 1 TABLET BY MOUTH TWICE DAILY 180 tablet 0  . nitroGLYCERIN (NITROSTAT) 0.4 MG SL tablet Place 1 tablet (0.4 mg total) under the tongue every 5 (five) minutes as needed for chest pain. 25 tablet 3  . Omega-3 Fatty Acids (FISH OIL) 1000 MG CAPS Take 2,000 mg by mouth daily.    Marland Kitchen omeprazole (PRILOSEC) 20 MG capsule Take 20 mg by mouth daily.    . rosuvastatin (CRESTOR) 5 MG tablet Take 1 tablet (5 mg total) by mouth daily. 90 tablet 3  . tamsulosin (FLOMAX) 0.4 MG CAPS capsule Take 0.4 mg by mouth daily.    Marland Kitchen  warfarin (COUMADIN) 5 MG tablet Take as directed by coumadin clinic 90 tablet 0  . zolpidem (AMBIEN) 5 MG tablet Take 2.5-5 mg by mouth at bedtime as needed for sleep.      No current facility-administered medications for this visit.   Facility-Administered Medications Ordered in Other Visits  Medication Dose Route Frequency Provider Last Rate Last Dose  . 0.9 %  sodium chloride infusion    Continuous PRN Lowella Dell, CRNA        Allergies:   Prednisone; Zocor; and Oxycodone hcl    Social History:  The patient  reports that he quit smoking about 56 years ago. He does not have any smokeless tobacco history on file. He reports that he drinks alcohol. He reports that he does not use illicit drugs.   Family History:  The patient's family history includes Cancer in his brother; Coronary artery disease in his brother and brother; Diabetes in his mother; Fainting in his brother; Heart attack in his  brother and father; Liver cancer in his brother; Peripheral vascular disease in his father; Sudden death in his brother.    ROS:  Please see the history of present illness.   Otherwise, review of systems are positive for none.   All other systems are reviewed and negative.    PHYSICAL EXAM: VS:  BP 138/58 mmHg  Pulse 46  Ht 6\' 1"  (1.854 m)  Wt 242 lb 1.9 oz (109.825 kg)  BMI 31.95 kg/m2  SpO2 97% , BMI Body mass index is 31.95 kg/(m^2). GEN: Well nourished, well developed, in no acute distress HEENT: normal Neck: no JVD, carotid bruits, or masses Cardiac: RRR; no rubs, or gallops,no edema.  1/6 SM at RUSB to LLSB Respiratory:  clear to auscultation bilaterally, normal work of breathing GI: soft, nontender, nondistended, + BS MS: no deformity or atrophy Skin: warm and dry, no rash Neuro:  Strength and sensation are intact Psych: euthymic mood, full affect   EKG:  EKG is not ordered today.    Recent Labs: 03/02/2014: ALT 16    Lipid Panel    Component Value Date/Time   CHOL 112 03/02/2014 0925   CHOL 101 06/17/2013 0821   TRIG 121.0 03/02/2014 0925   TRIG 75 06/17/2013 0821   HDL 39.70 03/02/2014 0925   HDL 42 06/17/2013 0821   CHOLHDL 3 03/02/2014 0925   VLDL 24.2 03/02/2014 0925   LDLCALC 48 03/02/2014 0925   LDLCALC 44 06/17/2013 0821      Wt Readings from Last 3 Encounters:  09/20/14 242 lb 1.9 oz (109.825 kg)  03/01/14 245 lb 12.8 oz (111.494 kg)  09/22/13 235 lb (106.595 kg)    ASSESSMENT AND PLAN:  Paroxysmal atrial fibrillation s/p DCCV 07/2013 maintaining NSR Continue BB/warfarin  Atherosclerosis of native coronary artery of native heart without angina pectoris No angina. He is not on aspirin as he is on Coumadin. Continue statin, beta blocker.  Mild Aortic stenosis by echo   Dilated aortic root Mild dilatation - repeat echo  Essential hypertension, benign Controlled. Continue metoprolol  Hyperlipidemia Continue statin/zetia. Check FLP and  ALT  Obstructive sleep apnea  Continue CPAP. OSA likely contributes to his atrial fibrillation and biatrial enlargement. His d/l today showed an AHI of 2.6/hr on 7cm H2O and 99% compliance in using more than 4 hours nightly.   Current medicines are reviewed at length with the patient today.  The patient does not have any concerns regarding medicines.  The following changes have been made:  no change  Labs/ tests ordered today: See above Assessment and Plan No orders of the defined types were placed in this encounter.     Disposition:   FU with me in 6 months  Signed, Sueanne Margarita, MD  09/20/2014 8:21 AM    [facility] [place], [place], [place]  [address] Phone: [phone]; Fax: [phone]

## 2014-09-20 ENCOUNTER — Other Ambulatory Visit (INDEPENDENT_AMBULATORY_CARE_PROVIDER_SITE_OTHER): Payer: PPO | Admitting: *Deleted

## 2014-09-20 ENCOUNTER — Encounter: Payer: Self-pay | Admitting: Cardiology

## 2014-09-20 ENCOUNTER — Ambulatory Visit (INDEPENDENT_AMBULATORY_CARE_PROVIDER_SITE_OTHER): Payer: PPO | Admitting: *Deleted

## 2014-09-20 ENCOUNTER — Ambulatory Visit (INDEPENDENT_AMBULATORY_CARE_PROVIDER_SITE_OTHER): Payer: PPO | Admitting: Cardiology

## 2014-09-20 VITALS — BP 138/58 | HR 46 | Ht 73.0 in | Wt 242.1 lb

## 2014-09-20 DIAGNOSIS — I2510 Atherosclerotic heart disease of native coronary artery without angina pectoris: Secondary | ICD-10-CM | POA: Diagnosis not present

## 2014-09-20 DIAGNOSIS — I480 Paroxysmal atrial fibrillation: Secondary | ICD-10-CM | POA: Diagnosis not present

## 2014-09-20 DIAGNOSIS — E785 Hyperlipidemia, unspecified: Secondary | ICD-10-CM | POA: Diagnosis not present

## 2014-09-20 DIAGNOSIS — I77810 Thoracic aortic ectasia: Secondary | ICD-10-CM | POA: Diagnosis not present

## 2014-09-20 DIAGNOSIS — I350 Nonrheumatic aortic (valve) stenosis: Secondary | ICD-10-CM | POA: Diagnosis not present

## 2014-09-20 DIAGNOSIS — I10 Essential (primary) hypertension: Secondary | ICD-10-CM

## 2014-09-20 DIAGNOSIS — Z7901 Long term (current) use of anticoagulants: Secondary | ICD-10-CM

## 2014-09-20 DIAGNOSIS — Z5181 Encounter for therapeutic drug level monitoring: Secondary | ICD-10-CM

## 2014-09-20 LAB — POCT INR: INR: 2.8

## 2014-09-20 LAB — LIPID PANEL
Cholesterol: 106 mg/dL (ref 0–200)
HDL: 39.3 mg/dL (ref >39.00)
LDL Cholesterol: 46 mg/dL (ref 0–99)
NonHDL: 66.26
Total CHOL/HDL Ratio: 3
Triglycerides: 100 mg/dL (ref 0.0–149.0)
VLDL: 20 mg/dL (ref 0.0–40.0)

## 2014-09-20 LAB — HEPATIC FUNCTION PANEL
ALT: 21 U/L (ref 0–53)
AST: 30 U/L (ref 0–37)
Albumin: 3.9 g/dL (ref 3.5–5.2)
Alkaline Phosphatase: 55 U/L (ref 39–117)
Bilirubin, Direct: 0.1 mg/dL (ref 0.0–0.3)
Total Bilirubin: 0.5 mg/dL (ref 0.2–1.2)
Total Protein: 6.9 g/dL (ref 6.0–8.3)

## 2014-09-20 MED ORDER — NITROGLYCERIN 0.4 MG SL SUBL: 0.4000 mg | SUBLINGUAL_TABLET | SUBLINGUAL | Status: DC | PRN

## 2014-09-20 MED ORDER — EZETIMIBE 10 MG PO TABS
10.0000 mg | ORAL_TABLET | Freq: Every day | ORAL | Status: DC
Start: 2014-09-20 — End: 2015-03-10

## 2014-09-20 MED ORDER — ROSUVASTATIN CALCIUM 5 MG PO TABS: 5.0000 mg | ORAL_TABLET | Freq: Every day | ORAL | Status: DC

## 2014-09-20 NOTE — Addendum Note (Signed)
Addended by: Harland German A on: 09/20/2014 10:27 AM   Modules accepted: Orders

## 2014-09-20 NOTE — Patient Instructions (Addendum)

## 2014-09-27 ENCOUNTER — Encounter: Payer: Self-pay | Admitting: Cardiology

## 2014-10-04 ENCOUNTER — Other Ambulatory Visit: Payer: Self-pay

## 2014-10-04 ENCOUNTER — Ambulatory Visit (HOSPITAL_COMMUNITY): Payer: PPO | Attending: Cardiology

## 2014-10-04 DIAGNOSIS — I517 Cardiomegaly: Secondary | ICD-10-CM | POA: Diagnosis not present

## 2014-10-04 DIAGNOSIS — I77810 Thoracic aortic ectasia: Secondary | ICD-10-CM

## 2014-10-04 DIAGNOSIS — I352 Nonrheumatic aortic (valve) stenosis with insufficiency: Secondary | ICD-10-CM | POA: Insufficient documentation

## 2014-10-04 DIAGNOSIS — I10 Essential (primary) hypertension: Secondary | ICD-10-CM | POA: Insufficient documentation

## 2014-10-04 DIAGNOSIS — E785 Hyperlipidemia, unspecified: Secondary | ICD-10-CM | POA: Insufficient documentation

## 2014-10-04 DIAGNOSIS — Q231 Congenital insufficiency of aortic valve: Secondary | ICD-10-CM | POA: Diagnosis present

## 2014-10-04 DIAGNOSIS — I071 Rheumatic tricuspid insufficiency: Secondary | ICD-10-CM | POA: Insufficient documentation

## 2014-10-05 ENCOUNTER — Telehealth: Payer: Self-pay

## 2014-10-05 DIAGNOSIS — I350 Nonrheumatic aortic (valve) stenosis: Secondary | ICD-10-CM

## 2014-10-05 DIAGNOSIS — I77810 Thoracic aortic ectasia: Secondary | ICD-10-CM

## 2014-10-05 NOTE — Telephone Encounter (Signed)
Informed patient of results and verbal understanding expressed.  Repeat ECHO ordered for scheduling in one year. Patient agrees with treatment plan. 

## 2014-10-05 NOTE — Telephone Encounter (Signed)
-----   Message from Sueanne Margarita, MD sent at 10/05/2014  4:40 PM EDT ----- Please let patient know that echo showed normal LVF with mild AS, mildly dilated aortic root - repeat in 1 year

## 2014-10-24 ENCOUNTER — Ambulatory Visit (INDEPENDENT_AMBULATORY_CARE_PROVIDER_SITE_OTHER): Payer: PPO | Admitting: *Deleted

## 2014-10-24 DIAGNOSIS — Z5181 Encounter for therapeutic drug level monitoring: Secondary | ICD-10-CM

## 2014-10-24 DIAGNOSIS — I480 Paroxysmal atrial fibrillation: Secondary | ICD-10-CM

## 2014-10-24 LAB — POCT INR: INR: 2.9

## 2014-12-01 ENCOUNTER — Other Ambulatory Visit: Payer: Self-pay | Admitting: Cardiology

## 2014-12-02 ENCOUNTER — Other Ambulatory Visit: Payer: Self-pay | Admitting: Cardiology

## 2014-12-05 ENCOUNTER — Ambulatory Visit (INDEPENDENT_AMBULATORY_CARE_PROVIDER_SITE_OTHER): Payer: PPO | Admitting: Pharmacist

## 2014-12-05 DIAGNOSIS — I480 Paroxysmal atrial fibrillation: Secondary | ICD-10-CM | POA: Diagnosis not present

## 2014-12-05 DIAGNOSIS — Z5181 Encounter for therapeutic drug level monitoring: Secondary | ICD-10-CM | POA: Diagnosis not present

## 2014-12-05 LAB — POCT INR: INR: 2.6

## 2014-12-19 ENCOUNTER — Other Ambulatory Visit: Payer: Self-pay | Admitting: Cardiology

## 2014-12-29 ENCOUNTER — Encounter: Payer: Self-pay | Admitting: Cardiology

## 2015-01-16 ENCOUNTER — Ambulatory Visit (INDEPENDENT_AMBULATORY_CARE_PROVIDER_SITE_OTHER): Payer: PPO | Admitting: *Deleted

## 2015-01-16 DIAGNOSIS — Z5181 Encounter for therapeutic drug level monitoring: Secondary | ICD-10-CM | POA: Diagnosis not present

## 2015-01-16 DIAGNOSIS — I480 Paroxysmal atrial fibrillation: Secondary | ICD-10-CM

## 2015-01-16 LAB — POCT INR: INR: 3.8

## 2015-02-08 DIAGNOSIS — L570 Actinic keratosis: Secondary | ICD-10-CM | POA: Diagnosis not present

## 2015-02-14 DIAGNOSIS — H25013 Cortical age-related cataract, bilateral: Secondary | ICD-10-CM | POA: Diagnosis not present

## 2015-02-14 DIAGNOSIS — H33312 Horseshoe tear of retina without detachment, left eye: Secondary | ICD-10-CM | POA: Diagnosis not present

## 2015-02-14 DIAGNOSIS — H2513 Age-related nuclear cataract, bilateral: Secondary | ICD-10-CM | POA: Diagnosis not present

## 2015-02-14 DIAGNOSIS — H3509 Other intraretinal microvascular abnormalities: Secondary | ICD-10-CM | POA: Diagnosis not present

## 2015-02-14 DIAGNOSIS — H35371 Puckering of macula, right eye: Secondary | ICD-10-CM | POA: Diagnosis not present

## 2015-02-24 ENCOUNTER — Telehealth: Payer: Self-pay | Admitting: Cardiology

## 2015-02-24 NOTE — Telephone Encounter (Signed)
Left message for patient to come to his OV fasting and Dr. Radford Pax will order all appropriate lab work. Instructed patient to call back with any questions or concerns.

## 2015-02-24 NOTE — Telephone Encounter (Signed)
New Message  Pt wanted a cholesterol panel done the day of Dr Radford Pax appt 03/30/15- no order in syst. Please advise

## 2015-02-27 ENCOUNTER — Ambulatory Visit (INDEPENDENT_AMBULATORY_CARE_PROVIDER_SITE_OTHER): Payer: PPO | Admitting: Pharmacist

## 2015-02-27 DIAGNOSIS — I10 Essential (primary) hypertension: Secondary | ICD-10-CM | POA: Diagnosis not present

## 2015-02-27 DIAGNOSIS — N400 Enlarged prostate without lower urinary tract symptoms: Secondary | ICD-10-CM | POA: Diagnosis not present

## 2015-02-27 DIAGNOSIS — Z5181 Encounter for therapeutic drug level monitoring: Secondary | ICD-10-CM | POA: Diagnosis not present

## 2015-02-27 DIAGNOSIS — K219 Gastro-esophageal reflux disease without esophagitis: Secondary | ICD-10-CM | POA: Diagnosis not present

## 2015-02-27 DIAGNOSIS — G4733 Obstructive sleep apnea (adult) (pediatric): Secondary | ICD-10-CM | POA: Diagnosis not present

## 2015-02-27 DIAGNOSIS — I480 Paroxysmal atrial fibrillation: Secondary | ICD-10-CM | POA: Diagnosis not present

## 2015-02-27 DIAGNOSIS — Z79899 Other long term (current) drug therapy: Secondary | ICD-10-CM | POA: Diagnosis not present

## 2015-02-27 DIAGNOSIS — Z1389 Encounter for screening for other disorder: Secondary | ICD-10-CM | POA: Diagnosis not present

## 2015-02-27 LAB — POCT INR: INR: 3.3

## 2015-03-10 ENCOUNTER — Other Ambulatory Visit: Payer: Self-pay | Admitting: Cardiology

## 2015-03-10 MED ORDER — EZETIMIBE 10 MG PO TABS: 10.0000 mg | ORAL_TABLET | Freq: Every day | ORAL | Status: DC

## 2015-03-10 MED ORDER — ROSUVASTATIN CALCIUM 5 MG PO TABS: 5.0000 mg | ORAL_TABLET | Freq: Every day | ORAL | Status: DC

## 2015-03-10 NOTE — Telephone Encounter (Signed)
Pt calling requesting a refill onZetia and Crestor, requesting a print out and pt will pick up at the front desk. Rx left for pt to pick up.

## 2015-03-29 NOTE — Progress Notes (Signed)
Cardiology Office Note   Date:  03/30/2015   ID:  BRAHM HORMAN, DOB [birth year], MRN [id number]  PCP:  Irven Shelling, MD    Chief Complaint  Patient presents with  . Atrial Fibrillation  . Sleep Apnea  . Coronary Artery Disease      History of Present Illness: Randall Chen is a 82 y.o. male with a history of ASCAD s/p inferior MI 2003 with PCI of RCA 6/03 and then PCI of left circ/OM 2/05, dyslipedemia, bicuspid AV, mildly dilated aorta, OSA on CPAP and HTN who presents today for followup. He is doing well. He denies any chest pain, SOB, DOE, LE edema, dizziness, weakness, palpitations or syncope. He tolerates his CPAP well. He feels rested when he gets up but rarely takes a nap. He tolerates his nasal pillow mask and feels the pressure is adequate. He does not snore. He occasionally exercises at the gym when he is not gardening.     Past Medical History  Diagnosis Date  . Coronary disease     2 Vessel,inferior wall MI june 2003, PTCA and Stent RCA June 2003, PTCA stent, Left Circumflex OM 02 Mar 2003, Dr Radford Pax  . Dyslipidemia   . Prostate cancer ([place])     /BPH, radioactive seed 2007, Nesi   . History of echocardiogram 08/2010    Bicuspid aortic valve w moderate aortic valve sclerosis,mild AS and mildly dilated aorta   . Hypertension   . Obesity   . DJD (degenerative joint disease) of knee     LEFT  . History of rotator cuff syndrome     (Right) with biceps tendinitis  . GERD (gastroesophageal reflux disease)   . Aortic stenosis     mild by echo 02/2012  . Dilated aortic root ([place])   . Dysrhythmia     afib  . OSA (obstructive sleep apnea)     presented with HA and drowsiness,mild sleep apnea with oxygen desats to 78%    Past Surgical History  Procedure Laterality Date  . Cardiac catheterization    . Coronary angioplasty    . Joint replacement      left knee replacement 2009  . Cardioversion N/A 08/27/2013    Procedure:  CARDIOVERSION;  Surgeon: Sueanne Margarita, MD;  Location: [facility];  Service: Cardiovascular;  Laterality: N/A;     Current Outpatient Prescriptions  Medication Sig Dispense Refill  . diphenhydrAMINE (BENADRYL) 25 MG tablet Take 25 mg by mouth at bedtime as needed for sleep.     Marland Kitchen ezetimibe (ZETIA) 10 MG tablet Take 1 tablet (10 mg total) by mouth daily. 90 tablet 2  . loratadine (CLARITIN) 10 MG tablet Take 10 mg by mouth daily as needed for allergies.    . metoprolol tartrate (LOPRESSOR) 25 MG tablet TAKE 1 TABLET BY MOUTH TWICE DAILY 180 tablet 2  . nitroGLYCERIN (NITROSTAT) 0.4 MG SL tablet Place 1 tablet (0.4 mg total) under the tongue every 5 (five) minutes as needed for chest pain. 25 tablet 1  . Omega-3 Fatty Acids (FISH OIL) 1000 MG CAPS Take 2,000 mg by mouth daily.    Marland Kitchen omeprazole (PRILOSEC) 20 MG capsule Take 20 mg by mouth daily.    . rosuvastatin (CRESTOR) 5 MG tablet Take 1 tablet (5 mg total) by mouth daily. 90 tablet 2  . tamsulosin (FLOMAX) 0.4 MG CAPS capsule Take 0.4 mg by mouth daily.    Marland Kitchen  warfarin (COUMADIN) 5 MG tablet TAKE AS DIRECTED BY COUMADIN CLINIC 90 tablet 0  . zolpidem (AMBIEN) 5 MG tablet Take 2.5-5 mg by mouth at bedtime as needed for sleep.      No current facility-administered medications for this visit.   Facility-Administered Medications Ordered in Other Visits  Medication Dose Route Frequency Provider Last Rate Last Dose  . 0.9 %  sodium chloride infusion    Continuous PRN Lowella Dell, CRNA        Allergies:   Prednisone; Zocor; and Oxycodone hcl    Social History:  The patient  reports that he quit smoking about 57 years ago. He does not have any smokeless tobacco history on file. He reports that he drinks alcohol. He reports that he does not use illicit drugs.   Family History:  The patient's family history includes Cancer in his brother; Coronary artery disease in his brother and brother; Diabetes in his mother; Fainting in his brother;  Heart attack in his brother and father; Liver cancer in his brother; Peripheral vascular disease in his father; Sudden death in his brother.    ROS:  Please see the history of present illness.   Otherwise, review of systems are positive for none.   All other systems are reviewed and negative.    PHYSICAL EXAM: VS:  BP 108/62 mmHg  Pulse 46  Ht 6\' 2"  (1.88 m)  Wt 226 lb 12.8 oz (102.876 kg)  BMI 29.11 kg/m2 , BMI Body mass index is 29.11 kg/(m^2). GEN: Well nourished, well developed, in no acute distress HEENT: normal Neck: no JVD, carotid bruits, or masses Cardiac: RRR; no murmurs, rubs, or gallops,no edema  Respiratory:  clear to auscultation bilaterally, normal work of breathing GI: soft, nontender, nondistended, + BS MS: no deformity or atrophy Skin: warm and dry, no rash Neuro:  Strength and sensation are intact Psych: euthymic mood, full affect   EKG:  EKG  ordered today showed sinus bradycardia at 42bpm with LAD    Recent Labs: 09/20/2014: ALT 21    Lipid Panel    Component Value Date/Time   CHOL 106 09/20/2014 0743   CHOL 101 06/17/2013 0821   TRIG 100.0 09/20/2014 0743   TRIG 75 06/17/2013 0821   HDL 39.30 09/20/2014 0743   HDL 42 06/17/2013 0821   CHOLHDL 3 09/20/2014 0743   VLDL 20.0 09/20/2014 0743   LDLCALC 46 09/20/2014 0743   LDLCALC 44 06/17/2013 0821      Wt Readings from Last 3 Encounters:  03/30/15 226 lb 12.8 oz (102.876 kg)  09/20/14 242 lb 1.9 oz (109.825 kg)  03/01/14 245 lb 12.8 oz (111.494 kg)     ASSESSMENT AND PLAN:  Paroxysmal atrial fibrillation s/p DCCV 07/2013 maintaining NSR Continue BB/warfarin  Atherosclerosis of native coronary artery of native heart without angina pectoris No angina. He is not on aspirin as he is on Coumadin. Continue statin, beta blocker.  Mild Aortic stenosis by echo 09/2014  Dilated aortic root Mild dilatation - repeat echo 09/2015  Essential hypertension, benign Controlled. Continue  metoprolol  Hyperlipidemia Continue statin/zetia. Check FLP and ALT  Obstructive sleep apnea  Continue CPAP. OSA likely contributes to his atrial fibrillation and biatrial enlargement.Get a d/l from DME.  Asymptomatic bradycardia - repeat HR 46bpm and he is completely asymptomatic.     Current medicines are reviewed at length with the patient today.  The patient does not have concerns regarding medicines.  The following changes have been made:  no  change  Labs/ tests ordered today: See above Assessment and Plan No orders of the defined types were placed in this encounter.     Disposition:   FU with ME in 6 months  Signed, Sueanne Margarita, MD  03/30/2015 8:57 AM    [facility] [place], [place], [place]  [address] Phone: [phone]; Fax: [phone]

## 2015-03-30 ENCOUNTER — Other Ambulatory Visit: Payer: PPO

## 2015-03-30 ENCOUNTER — Ambulatory Visit (INDEPENDENT_AMBULATORY_CARE_PROVIDER_SITE_OTHER): Payer: PPO | Admitting: Cardiology

## 2015-03-30 ENCOUNTER — Ambulatory Visit (INDEPENDENT_AMBULATORY_CARE_PROVIDER_SITE_OTHER): Payer: PPO | Admitting: *Deleted

## 2015-03-30 ENCOUNTER — Encounter: Payer: Self-pay | Admitting: Cardiology

## 2015-03-30 VITALS — BP 108/62 | HR 46 | Ht 74.0 in | Wt 226.8 lb

## 2015-03-30 DIAGNOSIS — I350 Nonrheumatic aortic (valve) stenosis: Secondary | ICD-10-CM

## 2015-03-30 DIAGNOSIS — I2510 Atherosclerotic heart disease of native coronary artery without angina pectoris: Secondary | ICD-10-CM | POA: Diagnosis not present

## 2015-03-30 DIAGNOSIS — I480 Paroxysmal atrial fibrillation: Secondary | ICD-10-CM | POA: Diagnosis not present

## 2015-03-30 DIAGNOSIS — I77810 Thoracic aortic ectasia: Secondary | ICD-10-CM | POA: Diagnosis not present

## 2015-03-30 DIAGNOSIS — I10 Essential (primary) hypertension: Secondary | ICD-10-CM

## 2015-03-30 DIAGNOSIS — E785 Hyperlipidemia, unspecified: Secondary | ICD-10-CM | POA: Diagnosis not present

## 2015-03-30 DIAGNOSIS — G4733 Obstructive sleep apnea (adult) (pediatric): Secondary | ICD-10-CM

## 2015-03-30 DIAGNOSIS — Z5181 Encounter for therapeutic drug level monitoring: Secondary | ICD-10-CM

## 2015-03-30 LAB — LIPID PANEL
Cholesterol: 90 mg/dL — ABNORMAL LOW (ref 125–200)
HDL: 35 mg/dL — ABNORMAL LOW (ref >=40)
LDL Cholesterol: 41 mg/dL (ref <130)
Total CHOL/HDL Ratio: 2.6 Ratio (ref <=5.0)
Triglycerides: 72 mg/dL (ref <150)
VLDL: 14 mg/dL (ref <30)

## 2015-03-30 LAB — HEPATIC FUNCTION PANEL
ALT: 17 U/L (ref 9–46)
AST: 24 U/L (ref 10–35)
Albumin: 4.1 g/dL (ref 3.6–5.1)
Alkaline Phosphatase: 62 U/L (ref 40–115)
Bilirubin, Direct: 0.2 mg/dL (ref <=0.2)
Indirect Bilirubin: 0.3 mg/dL (ref 0.2–1.2)
Total Bilirubin: 0.5 mg/dL (ref 0.2–1.2)
Total Protein: 6.7 g/dL (ref 6.1–8.1)

## 2015-03-30 LAB — POCT INR: INR: 2.6

## 2015-03-30 NOTE — Patient Instructions (Addendum)
Medication Instructions:  Your physician recommends that you continue on your current medications as directed. Please refer to the Current Medication list given to you today.   Labwork: TODAY: LFTs, Lipids   Testing/Procedures: None  Follow-Up: Your physician wants you to follow-up in: 6 months with Dr. Turner. You will receive a reminder letter in the mail two months in advance. If you don't receive a letter, please call our office to schedule the follow-up appointment.   Any Other Special Instructions Will Be Listed Below (If Applicable).     If you need a refill on your cardiac medications before your next appointment, please call your pharmacy.   

## 2015-04-14 DIAGNOSIS — G4733 Obstructive sleep apnea (adult) (pediatric): Secondary | ICD-10-CM | POA: Diagnosis not present

## 2015-05-11 ENCOUNTER — Ambulatory Visit (INDEPENDENT_AMBULATORY_CARE_PROVIDER_SITE_OTHER): Payer: PPO | Admitting: *Deleted

## 2015-05-11 DIAGNOSIS — I480 Paroxysmal atrial fibrillation: Secondary | ICD-10-CM | POA: Diagnosis not present

## 2015-05-11 DIAGNOSIS — Z5181 Encounter for therapeutic drug level monitoring: Secondary | ICD-10-CM

## 2015-05-11 LAB — POCT INR: INR: 2.3

## 2015-06-22 ENCOUNTER — Ambulatory Visit (INDEPENDENT_AMBULATORY_CARE_PROVIDER_SITE_OTHER): Payer: PPO | Admitting: Pharmacist

## 2015-06-22 DIAGNOSIS — Z5181 Encounter for therapeutic drug level monitoring: Secondary | ICD-10-CM

## 2015-06-22 DIAGNOSIS — I480 Paroxysmal atrial fibrillation: Secondary | ICD-10-CM

## 2015-06-22 LAB — POCT INR: INR: 2.2

## 2015-06-27 ENCOUNTER — Other Ambulatory Visit: Payer: Self-pay | Admitting: Cardiology

## 2015-08-07 ENCOUNTER — Ambulatory Visit (INDEPENDENT_AMBULATORY_CARE_PROVIDER_SITE_OTHER): Payer: PPO | Admitting: Pharmacist

## 2015-08-07 DIAGNOSIS — Z5181 Encounter for therapeutic drug level monitoring: Secondary | ICD-10-CM | POA: Diagnosis not present

## 2015-08-07 DIAGNOSIS — I480 Paroxysmal atrial fibrillation: Secondary | ICD-10-CM | POA: Diagnosis not present

## 2015-08-07 LAB — POCT INR: INR: 2

## 2015-09-01 ENCOUNTER — Other Ambulatory Visit: Payer: Self-pay | Admitting: Cardiology

## 2015-09-04 ENCOUNTER — Encounter: Payer: Self-pay | Admitting: *Deleted

## 2015-09-14 ENCOUNTER — Telehealth: Payer: Self-pay | Admitting: Cardiology

## 2015-09-14 NOTE — Telephone Encounter (Signed)
Patient states he usually has his labs drawn the same day but prior to Dr. Theodosia Blender OV. Since his upcoming OV is at 0830, encouraged patient to come fasting to appointment and have his labs drawn after seeing Dr. Radford Pax instead of before in case she requests more labs drawn. That way he would prevent multiple blood draws.  He agreed with treatment plan.

## 2015-09-14 NOTE — Telephone Encounter (Signed)
New Message  Pt voiced his labs are usually the same day as his appt before seeing MD Turner.  Did not see an order for labs and Pt would like to come in prior to his appt for labs.  Please follow up with pt if needed. Thanks!

## 2015-09-18 ENCOUNTER — Ambulatory Visit (INDEPENDENT_AMBULATORY_CARE_PROVIDER_SITE_OTHER): Payer: PPO | Admitting: Cardiology

## 2015-09-18 ENCOUNTER — Encounter: Payer: Self-pay | Admitting: Cardiology

## 2015-09-18 ENCOUNTER — Ambulatory Visit (INDEPENDENT_AMBULATORY_CARE_PROVIDER_SITE_OTHER): Payer: PPO | Admitting: Pharmacist

## 2015-09-18 VITALS — BP 168/82 | HR 48 | Ht 74.0 in | Wt 218.0 lb

## 2015-09-18 DIAGNOSIS — Z5181 Encounter for therapeutic drug level monitoring: Secondary | ICD-10-CM

## 2015-09-18 DIAGNOSIS — I2510 Atherosclerotic heart disease of native coronary artery without angina pectoris: Secondary | ICD-10-CM

## 2015-09-18 DIAGNOSIS — I350 Nonrheumatic aortic (valve) stenosis: Secondary | ICD-10-CM

## 2015-09-18 DIAGNOSIS — I77810 Thoracic aortic ectasia: Secondary | ICD-10-CM

## 2015-09-18 DIAGNOSIS — I480 Paroxysmal atrial fibrillation: Secondary | ICD-10-CM

## 2015-09-18 DIAGNOSIS — I10 Essential (primary) hypertension: Secondary | ICD-10-CM

## 2015-09-18 DIAGNOSIS — G4733 Obstructive sleep apnea (adult) (pediatric): Secondary | ICD-10-CM

## 2015-09-18 DIAGNOSIS — E785 Hyperlipidemia, unspecified: Secondary | ICD-10-CM

## 2015-09-18 LAB — HEPATIC FUNCTION PANEL
ALT: 12 U/L (ref 9–46)
AST: 19 U/L (ref 10–35)
Albumin: 4 g/dL (ref 3.6–5.1)
Alkaline Phosphatase: 50 U/L (ref 40–115)
Bilirubin, Direct: 0.1 mg/dL (ref <=0.2)
Indirect Bilirubin: 0.3 mg/dL (ref 0.2–1.2)
Total Bilirubin: 0.4 mg/dL (ref 0.2–1.2)
Total Protein: 6.5 g/dL (ref 6.1–8.1)

## 2015-09-18 LAB — POCT INR: INR: 2.1

## 2015-09-18 LAB — LIPID PANEL
Cholesterol: 106 mg/dL — ABNORMAL LOW (ref 125–200)
HDL: 40 mg/dL (ref >=40)
LDL Cholesterol: 36 mg/dL (ref <130)
Total CHOL/HDL Ratio: 2.7 Ratio (ref <=5.0)
Triglycerides: 148 mg/dL (ref <150)
VLDL: 30 mg/dL (ref <30)

## 2015-09-18 MED ORDER — ROSUVASTATIN CALCIUM 5 MG PO TABS: 5.0000 mg | ORAL_TABLET | Freq: Every day | ORAL | 3 refills | Status: DC

## 2015-09-18 MED ORDER — EZETIMIBE 10 MG PO TABS: 10.0000 mg | ORAL_TABLET | Freq: Every day | ORAL | 3 refills | Status: DC

## 2015-09-18 NOTE — Patient Instructions (Signed)
Medication Instructions:  Your physician recommends that you continue on your current medications as directed. Please refer to the Current Medication list given to you today.   Labwork: TODAY: LFTs, Lipids  Testing/Procedures: Your physician has requested that you have an echocardiogram. Echocardiography is a painless test that uses sound waves to create images of your heart. It provides your doctor with information about the size and shape of your heart and how well your heart's chambers and valves are working. This procedure takes approximately one hour. There are no restrictions for this procedure.  Follow-Up: Your physician wants you to follow-up in: 6 months with Dr. Radford Pax. You will receive a reminder letter in the mail two months in advance. If you don't receive a letter, please call our office to schedule the follow-up appointment.  Any Other Special Instructions Will Be Listed Below (If Applicable).     If you need a refill on your cardiac medications before your next appointment, please call your pharmacy.

## 2015-09-18 NOTE — Progress Notes (Signed)
Cardiology Office Note    Date:  09/18/2015   ID:  LACARLOS RESPASS, DOB 1932-02-11, MRN [id number]  PCP:  Irven Shelling, MD  Cardiologist:  Fransico Him, MD   Chief Complaint  Patient presents with  . Coronary Artery Disease  . Hypertension  . Atrial Fibrillation  . Hyperlipidemia    History of Present Illness:  ULAS METZKER is a 82 y.o. male with a history of ASCAD s/p inferior MI 2003 with PCI of RCA 6/03 and then PCI of left circ/OM 2/05, dyslipedemia, bicuspid AV, mildly dilated aorta, OSA on CPAP and HTN who presents today for followup. He is doing well. He denies any chest pain, SOB, DOE, LE edema, dizziness, weakness, palpitations or syncope. He tolerates his CPAP well. He feels rested when he gets up but rarely takes a nap. He tolerates his nasal pillow mask and feels the pressure is adequate. He does not snore. He occasionally exercises at the gym when he is not gardening.     Past Medical History:  Diagnosis Date  . Aortic stenosis    mild by echo 02/2012  . Coronary disease    2 Vessel,inferior wall MI june 2003, PTCA and Stent RCA June 2003, PTCA stent, Left Circumflex OM 02 Mar 2003, Dr Radford Pax  . Dilated aortic root ([place])   . DJD (degenerative joint disease) of knee    LEFT  . Dyslipidemia   . Dysrhythmia    afib  . GERD (gastroesophageal reflux disease)   . History of echocardiogram 08/2010   Bicuspid aortic valve w moderate aortic valve sclerosis,mild AS and mildly dilated aorta   . History of rotator cuff syndrome    (Right) with biceps tendinitis  . Hypertension   . Obesity   . OSA (obstructive sleep apnea)    presented with HA and drowsiness,mild sleep apnea with oxygen desats to 78%  . Prostate cancer ([place])    /BPH, radioactive seed 2007, Nesi     Past Surgical History:  Procedure Laterality Date  . CARDIAC CATHETERIZATION    . CARDIOVERSION N/A 08/27/2013   Procedure: CARDIOVERSION;  Surgeon: Sueanne Margarita, MD;  Location: [facility]  ENDOSCOPY;  Service: Cardiovascular;  Laterality: N/A;  . CORONARY ANGIOPLASTY    . JOINT REPLACEMENT     left knee replacement 2009    Current Medications: Outpatient Medications Prior to Visit  Medication Sig Dispense Refill  . diphenhydrAMINE (BENADRYL) 25 MG tablet Take 25 mg by mouth at bedtime as needed for sleep.     Marland Kitchen ezetimibe (ZETIA) 10 MG tablet Take 1 tablet (10 mg total) by mouth daily. 90 tablet 2  . loratadine (CLARITIN) 10 MG tablet Take 10 mg by mouth daily as needed for allergies.    . metoprolol tartrate (LOPRESSOR) 25 MG tablet TAKE 1 TABLET BY MOUTH TWICE DAILY 180 tablet 0  . nitroGLYCERIN (NITROSTAT) 0.4 MG SL tablet Place 1 tablet (0.4 mg total) under the tongue every 5 (five) minutes as needed for chest pain. 25 tablet 1  . Omega-3 Fatty Acids (FISH OIL) 1000 MG CAPS Take 2,000 mg by mouth daily.    Marland Kitchen omeprazole (PRILOSEC) 20 MG capsule Take 20 mg by mouth daily.    . rosuvastatin (CRESTOR) 5 MG tablet Take 1 tablet (5 mg total) by mouth daily. 90 tablet 2  . tamsulosin (FLOMAX) 0.4 MG CAPS capsule Take 0.4 mg by mouth daily.    Marland Kitchen warfarin (COUMADIN) 5 MG tablet TAKE AS DIRECTED BY COUMADIN CLINIC 90  tablet 1  . zolpidem (AMBIEN) 5 MG tablet Take 2.5-5 mg by mouth at bedtime as needed for sleep.      Facility-Administered Medications Prior to Visit  Medication Dose Route Frequency Provider Last Rate Last Dose  . 0.9 %  sodium chloride infusion    Continuous PRN Lowella Dell, CRNA         Allergies:   Prednisone; Zocor [simvastatin]; and Oxycodone hcl   Social History   Social History  . Marital status: Married    Spouse name: N/A  . Number of children: N/A  . Years of education: N/A   Social History Main Topics  . Smoking status: Former Smoker    Quit date: 01/28/1958  . Smokeless tobacco: None  . Alcohol use Yes     Comment: "a few a week"  . Drug use: No  . Sexual activity: Not Asked   Other Topics Concern  . None   Social History Narrative   . None     Family History:  The patient's family history includes Cancer in his brother; Coronary artery disease in his brother and brother; Diabetes in his mother; Fainting in his brother; Heart attack in his brother and father; Liver cancer in his brother; Peripheral vascular disease in his father; Sudden death in his brother.   ROS:   Please see the history of present illness.    ROS All other systems reviewed and are negative.   PHYSICAL EXAM:   VS:  BP (!) 168/82   Pulse (!) 48   Ht 6\' 2"  (1.88 m)   Wt 218 lb (98.9 kg)   SpO2 95%   BMI 27.99 kg/m    GEN: Well nourished, well developed, in no acute distress  HEENT: normal  Neck: no JVD, carotid bruits, or masses Cardiac: RRR; no murmurs, rubs, or gallops,no edema.  Intact distal pulses bilaterally.  Respiratory:  clear to auscultation bilaterally, normal work of breathing GI: soft, nontender, nondistended, + BS MS: no deformity or atrophy  Skin: warm and dry, no rash Neuro:  Alert and Oriented x 3, Strength and sensation are intact Psych: euthymic mood, full affect  Wt Readings from Last 3 Encounters:  09/18/15 218 lb (98.9 kg)  03/30/15 226 lb 12.8 oz (102.9 kg)  09/20/14 242 lb 1.9 oz (109.8 kg)      Studies/Labs Reviewed:   EKG:  EKG is not ordered today.   Recent Labs: 03/30/2015: ALT 17   Lipid Panel    Component Value Date/Time   CHOL 90 (L) 03/30/2015 0902   CHOL 101 06/17/2013 0821   TRIG 72 03/30/2015 0902   TRIG 75 06/17/2013 0821   HDL 35 (L) 03/30/2015 0902   HDL 42 06/17/2013 0821   CHOLHDL 2.6 03/30/2015 0902   VLDL 14 03/30/2015 0902   LDLCALC 41 03/30/2015 0902   LDLCALC 44 06/17/2013 0821    Additional studies/ records that were reviewed today include:  none    ASSESSMENT:    1. Atherosclerosis of native coronary artery of native heart without angina pectoris   2. Dilated aortic root ([place])   3. Essential hypertension, benign   4. PAF (paroxysmal atrial fibrillation) ([place])   5.  Aortic stenosis   6. OSA (obstructive sleep apnea)   7. Hyperlipidemia      PLAN:  In order of problems listed above:  1. ASCAD- s/p inferior MI 2003 with PCI of RCA 6/03 and then PCI of left circ/OM 2/05;  He has not had  any anginal symptoms.  Not on ASA due to warfarin. Continue statin and BB.   2. Dilated aortic root - repeat echo for stability 3. HTN - BP elevated today.  Continue BB.  I have asked him to check his BP daily for a week and call with the results.  4. PAF- maintaining NSR.  Continue BB and warfarin.  5. Aortic stenosis - mild by echo a year ago.  OSA - the patient is tolerating PAP therapy well without any problems. The PAP download was reviewed today and showed an AHI of 1.4/hr on 7 cm H2O with 98% compliance in using more than 4 hours nightly.  The patient has been using and benefiting from CPAP use and will continue to benefit from therapy.  6. Hyperlipidemia - LDL goal < 70 - continue statin/Zetia.  RepeatFLP and ALT.    Medication Adjustments/Labs and Tests Ordered: Current medicines are reviewed at length with the patient today.  Concerns regarding medicines are outlined above.  Medication changes, Labs and Tests ordered today are listed in the Patient Instructions below.  There are no Patient Instructions on file for this visit.   Signed, Fransico Him, MD  09/18/2015 8:37 AM    [facility] [address], [place], [place]  [address] Phone: [phone]; Fax: [phone]

## 2015-09-19 ENCOUNTER — Encounter: Payer: Self-pay | Admitting: Cardiology

## 2015-09-20 ENCOUNTER — Telehealth: Payer: Self-pay | Admitting: Cardiology

## 2015-09-20 NOTE — Telephone Encounter (Signed)
Informed patient of results and verbal understanding expressed.  

## 2015-09-20 NOTE — Telephone Encounter (Signed)
New message  ° ° °Pt verbalized that he is returning call for rn °

## 2015-09-20 NOTE — Telephone Encounter (Signed)
-----   Message from Sueanne Margarita, MD sent at 09/18/2015  6:59 PM EDT ----- Lipids at goal continue current therapy

## 2015-09-29 ENCOUNTER — Other Ambulatory Visit: Payer: Self-pay

## 2015-09-29 ENCOUNTER — Ambulatory Visit (HOSPITAL_COMMUNITY): Payer: PPO | Attending: Internal Medicine

## 2015-09-29 DIAGNOSIS — I119 Hypertensive heart disease without heart failure: Secondary | ICD-10-CM | POA: Diagnosis not present

## 2015-09-29 DIAGNOSIS — I350 Nonrheumatic aortic (valve) stenosis: Secondary | ICD-10-CM | POA: Diagnosis not present

## 2015-09-29 DIAGNOSIS — I2510 Atherosclerotic heart disease of native coronary artery without angina pectoris: Secondary | ICD-10-CM | POA: Diagnosis not present

## 2015-09-29 DIAGNOSIS — I340 Nonrheumatic mitral (valve) insufficiency: Secondary | ICD-10-CM | POA: Insufficient documentation

## 2015-09-29 DIAGNOSIS — I77810 Thoracic aortic ectasia: Secondary | ICD-10-CM | POA: Diagnosis not present

## 2015-09-29 DIAGNOSIS — E785 Hyperlipidemia, unspecified: Secondary | ICD-10-CM | POA: Diagnosis not present

## 2015-09-29 DIAGNOSIS — I352 Nonrheumatic aortic (valve) stenosis with insufficiency: Secondary | ICD-10-CM | POA: Diagnosis not present

## 2015-10-05 ENCOUNTER — Telehealth: Payer: Self-pay

## 2015-10-05 DIAGNOSIS — I77810 Thoracic aortic ectasia: Secondary | ICD-10-CM

## 2015-10-05 DIAGNOSIS — I350 Nonrheumatic aortic (valve) stenosis: Secondary | ICD-10-CM

## 2015-10-05 DIAGNOSIS — Q2381 Bicuspid aortic valve: Secondary | ICD-10-CM

## 2015-10-05 DIAGNOSIS — Q231 Congenital insufficiency of aortic valve: Secondary | ICD-10-CM

## 2015-10-05 NOTE — Telephone Encounter (Signed)
Informed patient of results and verbal understanding expressed.  Repeat ECHO ordered to be scheduled in 1 year. Patient has 2 adult children - he states he will talk to them and let them know Dr. Theodosia Blender recommendations.  He was grateful for call.

## 2015-10-05 NOTE — Telephone Encounter (Signed)
-----   Message from Sueanne Margarita, MD sent at 09/29/2015  2:20 PM EDT ----- Echo showed mildly thickened heart muscle with normal LVF and increased stiffness of heart muscle.  Calcified ? Bicuspid AV with mild AS and mildly dilated aortic root.  Repeat echo in 1 year.

## 2015-10-08 ENCOUNTER — Encounter: Payer: Self-pay | Admitting: Cardiology

## 2015-10-12 DIAGNOSIS — G4733 Obstructive sleep apnea (adult) (pediatric): Secondary | ICD-10-CM | POA: Diagnosis not present

## 2015-11-06 ENCOUNTER — Ambulatory Visit (INDEPENDENT_AMBULATORY_CARE_PROVIDER_SITE_OTHER): Payer: PPO | Admitting: *Deleted

## 2015-11-06 DIAGNOSIS — I480 Paroxysmal atrial fibrillation: Secondary | ICD-10-CM | POA: Diagnosis not present

## 2015-11-06 DIAGNOSIS — Z5181 Encounter for therapeutic drug level monitoring: Secondary | ICD-10-CM | POA: Diagnosis not present

## 2015-11-06 LAB — POCT INR: INR: 2.2

## 2015-11-14 DIAGNOSIS — L080 Pyoderma: Secondary | ICD-10-CM | POA: Diagnosis not present

## 2015-11-14 DIAGNOSIS — C44319 Basal cell carcinoma of skin of other parts of face: Secondary | ICD-10-CM | POA: Diagnosis not present

## 2015-11-14 DIAGNOSIS — L570 Actinic keratosis: Secondary | ICD-10-CM | POA: Diagnosis not present

## 2015-11-14 DIAGNOSIS — D485 Neoplasm of uncertain behavior of skin: Secondary | ICD-10-CM | POA: Diagnosis not present

## 2015-11-14 DIAGNOSIS — D044 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: Secondary | ICD-10-CM | POA: Diagnosis not present

## 2015-11-30 ENCOUNTER — Other Ambulatory Visit: Payer: Self-pay | Admitting: Cardiology

## 2015-12-14 DIAGNOSIS — D044 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: Secondary | ICD-10-CM | POA: Diagnosis not present

## 2015-12-14 DIAGNOSIS — C44319 Basal cell carcinoma of skin of other parts of face: Secondary | ICD-10-CM | POA: Diagnosis not present

## 2015-12-18 ENCOUNTER — Ambulatory Visit (INDEPENDENT_AMBULATORY_CARE_PROVIDER_SITE_OTHER): Payer: PPO | Admitting: *Deleted

## 2015-12-18 DIAGNOSIS — Z5181 Encounter for therapeutic drug level monitoring: Secondary | ICD-10-CM | POA: Diagnosis not present

## 2015-12-18 DIAGNOSIS — I480 Paroxysmal atrial fibrillation: Secondary | ICD-10-CM | POA: Diagnosis not present

## 2015-12-18 LAB — POCT INR: INR: 1.9

## 2016-01-02 DIAGNOSIS — J302 Other seasonal allergic rhinitis: Secondary | ICD-10-CM | POA: Diagnosis not present

## 2016-01-31 ENCOUNTER — Ambulatory Visit (INDEPENDENT_AMBULATORY_CARE_PROVIDER_SITE_OTHER): Payer: PPO | Admitting: *Deleted

## 2016-01-31 DIAGNOSIS — Z5181 Encounter for therapeutic drug level monitoring: Secondary | ICD-10-CM

## 2016-01-31 DIAGNOSIS — I480 Paroxysmal atrial fibrillation: Secondary | ICD-10-CM

## 2016-01-31 LAB — POCT INR: INR: 2.2

## 2016-02-09 DIAGNOSIS — R509 Fever, unspecified: Secondary | ICD-10-CM | POA: Diagnosis not present

## 2016-02-09 DIAGNOSIS — J00 Acute nasopharyngitis [common cold]: Secondary | ICD-10-CM | POA: Diagnosis not present

## 2016-02-09 DIAGNOSIS — R05 Cough: Secondary | ICD-10-CM | POA: Diagnosis not present

## 2016-02-27 DIAGNOSIS — G4733 Obstructive sleep apnea (adult) (pediatric): Secondary | ICD-10-CM | POA: Diagnosis not present

## 2016-02-27 DIAGNOSIS — Z125 Encounter for screening for malignant neoplasm of prostate: Secondary | ICD-10-CM | POA: Diagnosis not present

## 2016-02-27 DIAGNOSIS — Z0000 Encounter for general adult medical examination without abnormal findings: Secondary | ICD-10-CM | POA: Diagnosis not present

## 2016-02-27 DIAGNOSIS — I480 Paroxysmal atrial fibrillation: Secondary | ICD-10-CM | POA: Diagnosis not present

## 2016-02-27 DIAGNOSIS — N400 Enlarged prostate without lower urinary tract symptoms: Secondary | ICD-10-CM | POA: Diagnosis not present

## 2016-02-27 DIAGNOSIS — Z1389 Encounter for screening for other disorder: Secondary | ICD-10-CM | POA: Diagnosis not present

## 2016-02-27 DIAGNOSIS — Z8546 Personal history of malignant neoplasm of prostate: Secondary | ICD-10-CM | POA: Diagnosis not present

## 2016-02-27 DIAGNOSIS — E7800 Pure hypercholesterolemia, unspecified: Secondary | ICD-10-CM | POA: Diagnosis not present

## 2016-02-27 DIAGNOSIS — I2510 Atherosclerotic heart disease of native coronary artery without angina pectoris: Secondary | ICD-10-CM | POA: Diagnosis not present

## 2016-02-27 DIAGNOSIS — K219 Gastro-esophageal reflux disease without esophagitis: Secondary | ICD-10-CM | POA: Diagnosis not present

## 2016-02-27 DIAGNOSIS — I10 Essential (primary) hypertension: Secondary | ICD-10-CM | POA: Diagnosis not present

## 2016-03-05 DIAGNOSIS — L570 Actinic keratosis: Secondary | ICD-10-CM | POA: Diagnosis not present

## 2016-03-07 DIAGNOSIS — H25013 Cortical age-related cataract, bilateral: Secondary | ICD-10-CM | POA: Diagnosis not present

## 2016-03-07 DIAGNOSIS — H2513 Age-related nuclear cataract, bilateral: Secondary | ICD-10-CM | POA: Diagnosis not present

## 2016-03-07 DIAGNOSIS — H35371 Puckering of macula, right eye: Secondary | ICD-10-CM | POA: Diagnosis not present

## 2016-03-07 DIAGNOSIS — H2512 Age-related nuclear cataract, left eye: Secondary | ICD-10-CM | POA: Diagnosis not present

## 2016-03-07 DIAGNOSIS — H35363 Drusen (degenerative) of macula, bilateral: Secondary | ICD-10-CM | POA: Diagnosis not present

## 2016-03-08 ENCOUNTER — Encounter: Payer: Self-pay | Admitting: Cardiology

## 2016-03-13 ENCOUNTER — Ambulatory Visit (INDEPENDENT_AMBULATORY_CARE_PROVIDER_SITE_OTHER): Payer: PPO | Admitting: Cardiology

## 2016-03-13 ENCOUNTER — Telehealth: Payer: Self-pay | Admitting: Cardiology

## 2016-03-13 ENCOUNTER — Ambulatory Visit (INDEPENDENT_AMBULATORY_CARE_PROVIDER_SITE_OTHER): Payer: PPO | Admitting: *Deleted

## 2016-03-13 ENCOUNTER — Encounter: Payer: Self-pay | Admitting: Cardiology

## 2016-03-13 VITALS — BP 110/68 | HR 56 | Ht 73.0 in | Wt 218.8 lb

## 2016-03-13 DIAGNOSIS — I4819 Other persistent atrial fibrillation: Secondary | ICD-10-CM

## 2016-03-13 DIAGNOSIS — I480 Paroxysmal atrial fibrillation: Secondary | ICD-10-CM

## 2016-03-13 DIAGNOSIS — I10 Essential (primary) hypertension: Secondary | ICD-10-CM | POA: Diagnosis not present

## 2016-03-13 DIAGNOSIS — I350 Nonrheumatic aortic (valve) stenosis: Secondary | ICD-10-CM

## 2016-03-13 DIAGNOSIS — I481 Persistent atrial fibrillation: Secondary | ICD-10-CM | POA: Diagnosis not present

## 2016-03-13 DIAGNOSIS — Z5181 Encounter for therapeutic drug level monitoring: Secondary | ICD-10-CM | POA: Diagnosis not present

## 2016-03-13 DIAGNOSIS — I2510 Atherosclerotic heart disease of native coronary artery without angina pectoris: Secondary | ICD-10-CM

## 2016-03-13 DIAGNOSIS — E7800 Pure hypercholesterolemia, unspecified: Secondary | ICD-10-CM | POA: Diagnosis not present

## 2016-03-13 DIAGNOSIS — I77810 Thoracic aortic ectasia: Secondary | ICD-10-CM | POA: Diagnosis not present

## 2016-03-13 DIAGNOSIS — G4733 Obstructive sleep apnea (adult) (pediatric): Secondary | ICD-10-CM

## 2016-03-13 LAB — POCT INR: INR: 2.6

## 2016-03-13 LAB — LIPID PANEL
Chol/HDL Ratio: 2.3 ratio units (ref 0.0–5.0)
Cholesterol, Total: 95 mg/dL — ABNORMAL LOW (ref 100–199)
HDL: 41 mg/dL (ref >39)
LDL Calculated: 36 mg/dL (ref 0–99)
Triglycerides: 89 mg/dL (ref 0–149)
VLDL Cholesterol Cal: 18 mg/dL (ref 5–40)

## 2016-03-13 LAB — BASIC METABOLIC PANEL
BUN/Creatinine Ratio: 23 (ref 10–24)
BUN: 29 mg/dL — ABNORMAL HIGH (ref 8–27)
CO2: 23 mmol/L (ref 18–29)
Calcium: 9.4 mg/dL (ref 8.6–10.2)
Chloride: 104 mmol/L (ref 96–106)
Creatinine, Ser: 1.24 mg/dL (ref 0.76–1.27)
GFR calc Af Amer: 63 mL/min/{1.73_m2} (ref >59)
GFR calc non Af Amer: 54 mL/min/{1.73_m2} — ABNORMAL LOW (ref >59)
Glucose: 92 mg/dL (ref 65–99)
Potassium: 4.6 mmol/L (ref 3.5–5.2)
Sodium: 143 mmol/L (ref 134–144)

## 2016-03-13 LAB — CBC WITH DIFFERENTIAL/PLATELET
Basophils Absolute: 0 x10E3/uL (ref 0.0–0.2)
Basos: 0 %
EOS (ABSOLUTE): 0.2 x10E3/uL (ref 0.0–0.4)
Eos: 3 %
Hematocrit: 44.6 % (ref 37.5–51.0)
Hemoglobin: 14.7 g/dL (ref 13.0–17.7)
Immature Grans (Abs): 0 x10E3/uL (ref 0.0–0.1)
Immature Granulocytes: 0 %
Lymphocytes Absolute: 2.7 x10E3/uL (ref 0.7–3.1)
Lymphs: 46 %
MCH: 30.9 pg (ref 26.6–33.0)
MCHC: 33 g/dL (ref 31.5–35.7)
MCV: 94 fL (ref 79–97)
Monocytes Absolute: 0.7 x10E3/uL (ref 0.1–0.9)
Monocytes: 12 %
Neutrophils Absolute: 2.3 x10E3/uL (ref 1.4–7.0)
Neutrophils: 39 %
Platelets: 167 x10E3/uL (ref 150–379)
RBC: 4.75 x10E6/uL (ref 4.14–5.80)
RDW: 14 % (ref 12.3–15.4)
WBC: 5.9 x10E3/uL (ref 3.4–10.8)

## 2016-03-13 LAB — HEPATIC FUNCTION PANEL
ALT: 24 IU/L (ref 0–44)
AST: 35 IU/L (ref 0–40)
Albumin: 4.2 g/dL (ref 3.5–4.7)
Alkaline Phosphatase: 60 IU/L (ref 39–117)
Bilirubin Total: 0.5 mg/dL (ref 0.0–1.2)
Bilirubin, Direct: 0.18 mg/dL (ref 0.00–0.40)
Total Protein: 6.7 g/dL (ref 6.0–8.5)

## 2016-03-13 LAB — PROTIME-INR
INR: 2.7 — ABNORMAL HIGH (ref 0.8–1.2)
Prothrombin Time: 26.6 s — ABNORMAL HIGH (ref 9.1–12.0)

## 2016-03-13 LAB — MAGNESIUM: Magnesium: 1.8 mg/dL (ref 1.6–2.3)

## 2016-03-13 LAB — APTT: aPTT: 34 s — ABNORMAL HIGH (ref 24–33)

## 2016-03-13 NOTE — Progress Notes (Signed)
Cardiology Office Note    Date:  03/13/2016   ID:  [name], DOB 22-Aug-1932, MRN [id number]  PCP:  Irven Shelling, MD  Cardiologist:  Fransico Him, MD   Chief Complaint  Patient presents with  . Coronary Artery Disease  . Hypertension  . Sleep Apnea    History of Present Illness:  Randall Chen is a 83 y.o. male with a history of ASCAD s/p inferior MI 2003 with PCI of RCA 6/03 and then PCI of left circ/OM 2/05, dyslipidemia, bicuspid AV, mildly dilated aorta, PAF, OSA on CPAP and HTN who presents today for followup. He is doing well but thinks that his atrial fibrillation is back.  For the past week he has noticed DOE that is new and he has checked his pulse and says that it has been skipping.  He has also been very fatigued and gets tired very easily.  He denies any chest pain, LE edema, dizziness or syncope.He has not noticed any palpitations but feels his pulse is irregular.   He tolerates his CPAP well. He feels rested when he gets up but rarely takes a nap. He says that he is sleeping better than he ever did.  He tolerates his nasal pillow mask and feels the pressure is adequate. He does not snore.   Past Medical History:  Diagnosis Date  . Aortic stenosis    mild by echo 02/2012  . Coronary disease    2 Vessel,inferior wall MI june 2003, PTCA and Stent RCA June 2003, PTCA stent, Left Circumflex OM 02 Mar 2003, Dr Radford Pax  . Dilated aortic root ([place])   . DJD (degenerative joint disease) of knee    LEFT  . Dyslipidemia   . Dysrhythmia    afib  . GERD (gastroesophageal reflux disease)   . History of echocardiogram 08/2010   Bicuspid aortic valve w moderate aortic valve sclerosis,mild AS and mildly dilated aorta   . History of rotator cuff syndrome    (Right) with biceps tendinitis  . Hypertension   . Obesity   . OSA (obstructive sleep apnea)    presented with HA and drowsiness,mild sleep apnea with oxygen desats to 78%  . Prostate cancer ([place])    /BPH,  radioactive seed 2007, Nesi     Past Surgical History:  Procedure Laterality Date  . CARDIAC CATHETERIZATION    . CARDIOVERSION N/A 08/27/2013   Procedure: CARDIOVERSION;  Surgeon: Sueanne Margarita, MD;  Location: [facility] ENDOSCOPY;  Service: Cardiovascular;  Laterality: N/A;  . CORONARY ANGIOPLASTY    . JOINT REPLACEMENT     left knee replacement 2009    Current Medications: Outpatient Medications Prior to Visit  Medication Sig Dispense Refill  . diphenhydrAMINE (BENADRYL) 25 MG tablet Take 25 mg by mouth at bedtime as needed for sleep.     Marland Kitchen ezetimibe (ZETIA) 10 MG tablet Take 1 tablet (10 mg total) by mouth daily. 90 tablet 3  . loratadine (CLARITIN) 10 MG tablet Take 10 mg by mouth daily as needed for allergies.    . metoprolol tartrate (LOPRESSOR) 25 MG tablet TAKE 1 TABLET BY MOUTH TWICE DAILY 180 tablet 3  . nitroGLYCERIN (NITROSTAT) 0.4 MG SL tablet Place 1 tablet (0.4 mg total) under the tongue every 5 (five) minutes as needed for chest pain. 25 tablet 1  . Omega-3 Fatty Acids (FISH OIL) 1000 MG CAPS Take 2,000 mg by mouth daily.    Marland Kitchen omeprazole (PRILOSEC) 20 MG capsule Take 20 mg by mouth  daily.    . rosuvastatin (CRESTOR) 5 MG tablet Take 1 tablet (5 mg total) by mouth daily. 90 tablet 3  . tamsulosin (FLOMAX) 0.4 MG CAPS capsule Take 0.4 mg by mouth daily.    Marland Kitchen warfarin (COUMADIN) 5 MG tablet TAKE AS DIRECTED BY COUMADIN CLINIC 90 tablet 1  . zolpidem (AMBIEN) 5 MG tablet Take 2.5-5 mg by mouth at bedtime as needed for sleep.      Facility-Administered Medications Prior to Visit  Medication Dose Route Frequency Provider Last Rate Last Dose  . 0.9 %  sodium chloride infusion    Continuous PRN Lowella Dell, CRNA         Allergies:   Prednisone; Zocor [simvastatin]; and Oxycodone hcl   Social History   Social History  . Marital status: Married    Spouse name: N/A  . Number of children: N/A  . Years of education: N/A   Social History Main Topics  . Smoking status:  Former Smoker    Quit date: 01/28/1958  . Smokeless tobacco: Never Used  . Alcohol use Yes     Comment: "a few a week"  . Drug use: No  . Sexual activity: Not Asked   Other Topics Concern  . None   Social History Narrative  . None     Family History:  The patient's family history includes Cancer in his brother; Coronary artery disease in his brother and brother; Diabetes in his mother; Fainting in his brother; Heart attack in his brother and father; Liver cancer in his brother; Peripheral vascular disease in his father; Sudden death in his brother.   ROS:   Please see the history of present illness.    ROS All other systems reviewed and are negative.  No flowsheet data found.     PHYSICAL EXAM:   VS:  BP 110/68   Pulse (!) 56   Ht 6\' 1"  (1.854 m)   Wt 218 lb 12.8 oz (99.2 kg)   BMI 28.87 kg/m    GEN: Well nourished, well developed, in no acute distress  HEENT: normal  Neck: no JVD, carotid bruits, or masses Cardiac: irregularly irregular; no murmurs, rubs, or gallops,no edema.  Intact distal pulses bilaterally.  Respiratory:  clear to auscultation bilaterally, normal work of breathing GI: soft, nontender, nondistended, + BS MS: no deformity or atrophy  Skin: warm and dry, no rash Neuro:  Alert and Oriented x 3, Strength and sensation are intact Psych: euthymic mood, full affect  Wt Readings from Last 3 Encounters:  03/13/16 218 lb 12.8 oz (99.2 kg)  09/18/15 218 lb (98.9 kg)  03/30/15 226 lb 12.8 oz (102.9 kg)      Studies/Labs Reviewed:   EKG:  EKG is ordered today.  The ekg ordered today demonstrates atrial fibrillation with slow VR.  Recent Labs: 09/18/2015: ALT 12   Lipid Panel    Component Value Date/Time   CHOL 106 (L) 09/18/2015 0902   CHOL 101 06/17/2013 0821   TRIG 148 09/18/2015 0902   TRIG 75 06/17/2013 0821   HDL 40 09/18/2015 0902   HDL 42 06/17/2013 0821   CHOLHDL 2.7 09/18/2015 0902   VLDL 30 09/18/2015 0902   LDLCALC 36 09/18/2015 0902    LDLCALC 44 06/17/2013 0821    Additional studies/ records that were reviewed today include:  none    ASSESSMENT:    1. Atherosclerosis of native coronary artery of native heart without angina pectoris   2. Essential hypertension, benign  3. Aortic valve stenosis, etiology of cardiac valve disease unspecified   4. Dilated aortic root ([facility])   5. Persistent atrial fibrillation ([place])   6. OSA (obstructive sleep apnea)   7. Pure hypercholesterolemia      PLAN:  In order of problems listed above:  1. ASCAD - s/p inferior MI 2003 with PCI of RCA 6/03 and then PCI of left circ/OM 2/05.  He has not had any anginal symptoms.  Continue statin and BB.  He is not on ASA due to warfarin.   2. HTN - BP controlled on current meds.  He will continue on BB 3. Aortic stenosis - Bicuspid AV with mild AS by echo 09/2015.  Repeat echo 09/2016. 4. Dilated aortic root - mildly dilated at 79mm.  Repeat echo 09/2016. 5. Persistent atrial fibrillation - he is now back in afib with SVR.  He is fairly asymptomatic.  He is therapeutic with his INR so I will set him up for DCCV later this week.   OSA - the patient is tolerating PAP therapy well without any problems. The PAP download was reviewed today and showed an AHI of 2.5/hr on 7 cm H2O with 92% compliance in using more than 4 hours nightly.  The patient has been using and benefiting from CPAP use and will continue to benefit from therapy.  7.   Hyperlipidemia - continue statin and Zetia.  Check FLP and ALT.    Medication Adjustments/Labs and Tests Ordered: Current medicines are reviewed at length with the patient today.  Concerns regarding medicines are outlined above.  Medication changes, Labs and Tests ordered today are listed in the Patient Instructions below.  There are no Patient Instructions on file for this visit.   Signed, Fransico Him, MD  03/13/2016 8:13 AM    [facility], [place], [place]   [address] Phone: [phone]; Fax: [phone]

## 2016-03-13 NOTE — Patient Instructions (Addendum)
Medication Instructions:  Your physician recommends that you continue on your current medications as directed. Please refer to the Current Medication list given to you today.   Labwork: TODAY: BMET, Mag, CBC, LFTs, Lipids, PT, INR  Testing/Procedures: Your physician has recommended that you have a Cardioversion (DCCV). Electrical Cardioversion uses a jolt of electricity to your heart either through paddles or wired patches attached to your chest. This is a controlled, usually prescheduled, procedure. Defibrillation is done under light anesthesia in the hospital, and you usually go home the day of the procedure. This is done to get your heart back into a normal rhythm. You are not awake for the procedure. Please see the instruction sheet given to you today.  Follow-Up: Your physician recommends that you schedule a follow-up appointment on 03/25/2016 at 9:15AM with Dr. Theodosia Blender assistant.  Your physician wants you to follow-up in: 6 months with Dr. Radford Pax. You will receive a reminder letter in the mail two months in advance. If you don't receive a letter, please call our office to schedule the follow-up appointment.   Any Other Special Instructions Will Be Listed Below (If Applicable).     If you need a refill on your cardiac medications before your next appointment, please call your pharmacy.

## 2016-03-13 NOTE — Telephone Encounter (Signed)
New message    Pt calling to find out when his cardioversion is.

## 2016-03-13 NOTE — Telephone Encounter (Signed)
Confirmed DCCV with patient Friday, February 16 with Dr. Meda Coffee. He understands to be at the [facility] him to take medications as instructed with water sips and to not eat after midnight. He understands to have someone drive him home. He was grateful for call and agrees with treatment plan.   Confirmation # [id number]

## 2016-03-14 ENCOUNTER — Other Ambulatory Visit: Payer: Self-pay | Admitting: Cardiology

## 2016-03-14 DIAGNOSIS — I4819 Other persistent atrial fibrillation: Secondary | ICD-10-CM

## 2016-03-15 ENCOUNTER — Ambulatory Visit (HOSPITAL_COMMUNITY)
Admission: RE | Admit: 2016-03-15 | Discharge: 2016-03-15 | Disposition: A | Discharge status: Home / Self Care | Payer: PPO | Source: Ambulatory Visit | Attending: Cardiology | Admitting: Cardiology

## 2016-03-15 ENCOUNTER — Encounter (HOSPITAL_COMMUNITY)
Admission: RE | Disposition: A | Discharge status: Home / Self Care | Payer: Self-pay | Source: Ambulatory Visit | Attending: Cardiology

## 2016-03-15 ENCOUNTER — Ambulatory Visit (HOSPITAL_COMMUNITY): Payer: PPO | Admitting: Anesthesiology

## 2016-03-15 ENCOUNTER — Encounter (HOSPITAL_COMMUNITY): Payer: Self-pay | Admitting: *Deleted

## 2016-03-15 DIAGNOSIS — G4730 Sleep apnea, unspecified: Secondary | ICD-10-CM | POA: Insufficient documentation

## 2016-03-15 DIAGNOSIS — Z87891 Personal history of nicotine dependence: Secondary | ICD-10-CM | POA: Insufficient documentation

## 2016-03-15 DIAGNOSIS — E785 Hyperlipidemia, unspecified: Secondary | ICD-10-CM | POA: Diagnosis not present

## 2016-03-15 DIAGNOSIS — I4891 Unspecified atrial fibrillation: Secondary | ICD-10-CM | POA: Diagnosis not present

## 2016-03-15 DIAGNOSIS — I481 Persistent atrial fibrillation: Secondary | ICD-10-CM

## 2016-03-15 DIAGNOSIS — K219 Gastro-esophageal reflux disease without esophagitis: Secondary | ICD-10-CM | POA: Diagnosis not present

## 2016-03-15 DIAGNOSIS — I2510 Atherosclerotic heart disease of native coronary artery without angina pectoris: Secondary | ICD-10-CM | POA: Insufficient documentation

## 2016-03-15 DIAGNOSIS — I10 Essential (primary) hypertension: Secondary | ICD-10-CM | POA: Diagnosis not present

## 2016-03-15 DIAGNOSIS — Z79899 Other long term (current) drug therapy: Secondary | ICD-10-CM | POA: Diagnosis not present

## 2016-03-15 DIAGNOSIS — I4819 Other persistent atrial fibrillation: Secondary | ICD-10-CM

## 2016-03-15 DIAGNOSIS — G4733 Obstructive sleep apnea (adult) (pediatric): Secondary | ICD-10-CM | POA: Diagnosis not present

## 2016-03-15 HISTORY — PX: CARDIOVERSION: SHX1299

## 2016-03-15 SURGERY — CARDIOVERSION
Anesthesia: General

## 2016-03-15 MED ORDER — LIDOCAINE HCL (CARDIAC) 20 MG/ML IV SOLN
INTRAVENOUS | Status: DC | PRN
  Administered 2016-03-15: 80 mg via INTRATRACHEAL

## 2016-03-15 MED ORDER — SODIUM CHLORIDE 0.9% FLUSH: 3.0000 mL | Freq: Two times a day (BID) | INTRAVENOUS | Status: DC

## 2016-03-15 MED ORDER — PROPOFOL 10 MG/ML IV BOLUS
INTRAVENOUS | Status: DC | PRN
  Administered 2016-03-15: 80 mg via INTRAVENOUS

## 2016-03-15 MED ORDER — SODIUM CHLORIDE 0.9 % IV SOLN: 250.0000 mL | INTRAVENOUS | Status: DC

## 2016-03-15 MED ORDER — SODIUM CHLORIDE 0.9% FLUSH: 3.0000 mL | INTRAVENOUS | Status: DC | PRN

## 2016-03-15 NOTE — CV Procedure (Signed)
    Cardioversion Note  PRAJIT BALTAZAR [id number] 25-Mar-1932  Procedure: DC Cardioversion Indications: atrial fibrillation  Procedure Details Consent: Obtained Time Out: Verified patient identification, verified procedure, site/side was marked, verified correct patient position, special equipment/implants available, Radiology Safety Procedures followed,  medications/allergies/relevent history reviewed, required imaging and test results available.  Performed  The patient has been on adequate anticoagulation.  The patient received IV lidocaine and propofol administered by anesthesia staff for sedation.  Synchronous cardioversion was performed at 120 joules.  The cardioversion was successful.   Complications: No apparent complications Patient did tolerate procedure well.   Ena Dawley, MD, [facility] 03/15/2016, 11:58 AM

## 2016-03-15 NOTE — Anesthesia Postprocedure Evaluation (Signed)
Anesthesia Post Note  Patient: ANSH BAITY  Procedure(s) Performed: Procedure(s) (LRB): CARDIOVERSION (N/A)  Patient location during evaluation: PACU Anesthesia Type: General Level of consciousness: awake and alert Pain management: pain level controlled Vital Signs Assessment: post-procedure vital signs reviewed and stable Respiratory status: spontaneous breathing, nonlabored ventilation, respiratory function stable and patient connected to nasal cannula oxygen Cardiovascular status: blood pressure returned to baseline and stable Postop Assessment: no signs of nausea or vomiting Anesthetic complications: no       Last Vitals:  Vitals:   03/15/16 1210 03/15/16 1220  BP: 96/60 115/66  Pulse: (!) 54 (!) 54  Resp: (!) 21 14  Temp:      Last Pain:  Vitals:   03/15/16 1159  TempSrc: Oral                 Finnlee Silvernail DAVID

## 2016-03-15 NOTE — Anesthesia Preprocedure Evaluation (Signed)
Anesthesia Evaluation  Patient identified by MRN, date of birth, ID band Patient awake    Reviewed: Allergy & Precautions, NPO status , Patient's Chart, lab work & pertinent test results  Airway Mallampati: I  TM Distance: >3 FB Neck ROM: Full    Dental   Pulmonary sleep apnea , former smoker,    Pulmonary exam normal        Cardiovascular hypertension, + CAD  Normal cardiovascular exam+ dysrhythmias Atrial Fibrillation      Neuro/Psych    GI/Hepatic GERD  Medicated and Controlled,  Endo/Other    Renal/GU      Musculoskeletal   Abdominal   Peds  Hematology   Anesthesia Other Findings   Reproductive/Obstetrics                             Anesthesia Physical Anesthesia Plan  ASA: III  Anesthesia Plan: General   Post-op Pain Management:    Induction: Intravenous  Airway Management Planned: Mask  Additional Equipment:   Intra-op Plan:   Post-operative Plan:   Informed Consent: I have reviewed the patients History and Physical, chart, labs and discussed the procedure including the risks, benefits and alternatives for the proposed anesthesia with the patient or authorized representative who has indicated his/her understanding and acceptance.     Plan Discussed with: CRNA and Surgeon  Anesthesia Plan Comments:         Anesthesia Quick Evaluation

## 2016-03-15 NOTE — Interval H&P Note (Signed)
History and Physical Interval Note:  03/15/2016 11:57 AM  Randall Chen  has presented today for surgery, with the diagnosis of AFIB  The various methods of treatment have been discussed with the patient and family. After consideration of risks, benefits and other options for treatment, the patient has consented to  Procedure(s): CARDIOVERSION (N/A) as a surgical intervention .  The patient's history has been reviewed, patient examined, no change in status, stable for surgery.  I have reviewed the patient's chart and labs.  Questions were answered to the patient's satisfaction.     Ena Dawley

## 2016-03-15 NOTE — H&P (View-Only) (Signed)
Cardiology Office Note    Date:  03/13/2016   ID:  Randall Chen, DOB 12-26-1932, MRN [id number]  PCP:  Irven Shelling, MD  Cardiologist:  Fransico Him, MD   Chief Complaint  Patient presents with  . Coronary Artery Disease  . Hypertension  . Sleep Apnea    History of Present Illness:  Randall Chen is a 83 y.o. male with a history of ASCAD s/p inferior MI 2003 with PCI of RCA 6/03 and then PCI of left circ/OM 2/05, dyslipidemia, bicuspid AV, mildly dilated aorta, PAF, OSA on CPAP and HTN who presents today for followup. He is doing well but thinks that his atrial fibrillation is back.  For the past week he has noticed DOE that is new and he has checked his pulse and says that it has been skipping.  He has also been very fatigued and gets tired very easily.  He denies any chest pain, LE edema, dizziness or syncope.He has not noticed any palpitations but feels his pulse is irregular.   He tolerates his CPAP well. He feels rested when he gets up but rarely takes a nap. He says that he is sleeping better than he ever did.  He tolerates his nasal pillow mask and feels the pressure is adequate. He does not snore.   Past Medical History:  Diagnosis Date  . Aortic stenosis    mild by echo 02/2012  . Coronary disease    2 Vessel,inferior wall MI june 2003, PTCA and Stent RCA June 2003, PTCA stent, Left Circumflex OM 02 Mar 2003, Dr Radford Pax  . Dilated aortic root ([place])   . DJD (degenerative joint disease) of knee    LEFT  . Dyslipidemia   . Dysrhythmia    afib  . GERD (gastroesophageal reflux disease)   . History of echocardiogram 08/2010   Bicuspid aortic valve w moderate aortic valve sclerosis,mild AS and mildly dilated aorta   . History of rotator cuff syndrome    (Right) with biceps tendinitis  . Hypertension   . Obesity   . OSA (obstructive sleep apnea)    presented with HA and drowsiness,mild sleep apnea with oxygen desats to 78%  . Prostate cancer (Randall Chen)    /BPH,  radioactive seed 2007, Nesi     Past Surgical History:  Procedure Laterality Date  . CARDIAC CATHETERIZATION    . CARDIOVERSION N/A 08/27/2013   Procedure: CARDIOVERSION;  Surgeon: Sueanne Margarita, MD;  Location: [facility];  Service: Cardiovascular;  Laterality: N/A;  . CORONARY ANGIOPLASTY    . JOINT REPLACEMENT     left knee replacement 2009    Current Medications: Outpatient Medications Prior to Visit  Medication Sig Dispense Refill  . diphenhydrAMINE (BENADRYL) 25 MG tablet Take 25 mg by mouth at bedtime as needed for sleep.     Marland Kitchen ezetimibe (ZETIA) 10 MG tablet Take 1 tablet (10 mg total) by mouth daily. 90 tablet 3  . loratadine (CLARITIN) 10 MG tablet Take 10 mg by mouth daily as needed for allergies.    . metoprolol tartrate (LOPRESSOR) 25 MG tablet TAKE 1 TABLET BY MOUTH TWICE DAILY 180 tablet 3  . nitroGLYCERIN (NITROSTAT) 0.4 MG SL tablet Place 1 tablet (0.4 mg total) under the tongue every 5 (five) minutes as needed for chest pain. 25 tablet 1  . Omega-3 Fatty Acids (FISH OIL) 1000 MG CAPS Take 2,000 mg by mouth daily.    Marland Kitchen omeprazole (PRILOSEC) 20 MG capsule Take 20 mg by mouth  daily.    . rosuvastatin (CRESTOR) 5 MG tablet Take 1 tablet (5 mg total) by mouth daily. 90 tablet 3  . tamsulosin (FLOMAX) 0.4 MG CAPS capsule Take 0.4 mg by mouth daily.    Marland Kitchen warfarin (COUMADIN) 5 MG tablet TAKE AS DIRECTED BY COUMADIN CLINIC 90 tablet 1  . zolpidem (AMBIEN) 5 MG tablet Take 2.5-5 mg by mouth at bedtime as needed for sleep.      Facility-Administered Medications Prior to Visit  Medication Dose Route Frequency Provider Last Rate Last Dose  . 0.9 %  sodium chloride infusion    Continuous PRN Lowella Dell, CRNA         Allergies:   Prednisone; Zocor [simvastatin]; and Oxycodone hcl   Social History   Social History  . Marital status: Married    Spouse name: N/A  . Number of children: N/A  . Years of education: N/A   Social History Main Topics  . Smoking status:  Former Smoker    Quit date: 01/28/1958  . Smokeless tobacco: Never Used  . Alcohol use Yes     Comment: "a few a week"  . Drug use: No  . Sexual activity: Not Asked   Other Topics Concern  . None   Social History Narrative  . None     Family History:  The patient's family history includes Cancer in his brother; Coronary artery disease in his brother and brother; Diabetes in his mother; Fainting in his brother; Heart attack in his brother and father; Liver cancer in his brother; Peripheral vascular disease in his father; Sudden death in his brother.   ROS:   Please see the history of present illness.    ROS All other systems reviewed and are negative.  No flowsheet data found.     PHYSICAL EXAM:   VS:  BP 110/68   Pulse (!) 56   Ht 6\' 1"  (1.854 m)   Wt 218 lb 12.8 oz (99.2 kg)   BMI 28.87 kg/m    GEN: Well nourished, well developed, in no acute distress  HEENT: normal  Neck: no JVD, carotid bruits, or masses Cardiac: irregularly irregular; no murmurs, rubs, or gallops,no edema.  Intact distal pulses bilaterally.  Respiratory:  clear to auscultation bilaterally, normal work of breathing GI: soft, nontender, nondistended, + BS MS: no deformity or atrophy  Skin: warm and dry, no rash Neuro:  Alert and Oriented x 3, Strength and sensation are intact Psych: euthymic mood, full affect  Wt Readings from Last 3 Encounters:  03/13/16 218 lb 12.8 oz (99.2 kg)  09/18/15 218 lb (98.9 kg)  03/30/15 226 lb 12.8 oz (102.9 kg)      Studies/Labs Reviewed:   EKG:  EKG is ordered today.  The ekg ordered today demonstrates atrial fibrillation with slow VR.  Recent Labs: 09/18/2015: ALT 12   Lipid Panel    Component Value Date/Time   CHOL 106 (L) 09/18/2015 0902   CHOL 101 06/17/2013 0821   TRIG 148 09/18/2015 0902   TRIG 75 06/17/2013 0821   HDL 40 09/18/2015 0902   HDL 42 06/17/2013 0821   CHOLHDL 2.7 09/18/2015 0902   VLDL 30 09/18/2015 0902   LDLCALC 36 09/18/2015 0902    LDLCALC 44 06/17/2013 0821    Additional studies/ records that were reviewed today include:  none    ASSESSMENT:    1. Atherosclerosis of native coronary artery of native heart without angina pectoris   2. Essential hypertension, benign  3. Aortic valve stenosis, etiology of cardiac valve disease unspecified   4. Dilated aortic root (HCC)   5. Persistent atrial fibrillation ([place])   6. OSA (obstructive sleep apnea)   7. Pure hypercholesterolemia      PLAN:  In order of problems listed above:  1. ASCAD - s/p inferior MI 2003 with PCI of RCA 6/03 and then PCI of left circ/OM 2/05.  He has not had any anginal symptoms.  Continue statin and BB.  He is not on ASA due to warfarin.   2. HTN - BP controlled on current meds.  He will continue on BB 3. Aortic stenosis - Bicuspid AV with mild AS by echo 09/2015.  Repeat echo 09/2016. 4. Dilated aortic root - mildly dilated at 68mm.  Repeat echo 09/2016. 5. Persistent atrial fibrillation - he is now back in afib with SVR.  He is fairly asymptomatic.  He is therapeutic with his INR so I will set him up for DCCV later this week.   OSA - the patient is tolerating PAP therapy well without any problems. The PAP download was reviewed today and showed an AHI of 2.5/hr on 7 cm H2O with 92% compliance in using more than 4 hours nightly.  The patient has been using and benefiting from CPAP use and will continue to benefit from therapy.  7.   Hyperlipidemia - continue statin and Zetia.  Check FLP and ALT.    Medication Adjustments/Labs and Tests Ordered: Current medicines are reviewed at length with the patient today.  Concerns regarding medicines are outlined above.  Medication changes, Labs and Tests ordered today are listed in the Patient Instructions below.  There are no Patient Instructions on file for this visit.   Signed, Fransico Him, MD  03/13/2016 8:13 AM    [address], [place], [place]   [address] Phone: [phone]; Fax: [phone]

## 2016-03-15 NOTE — Discharge Instructions (Signed)
Electrical Cardioversion, Care After °This sheet gives you information about how to care for yourself after your procedure. Your health care provider may also give you more specific instructions. If you have problems or questions, contact your health care provider. °What can I expect after the procedure? °After the procedure, it is common to have: °· Some redness on the skin where the shocks were given. °Follow these instructions at home: °· Do not drive for 24 hours if you were given a medicine to help you relax (sedative). °· Take over-the-counter and prescription medicines only as told by your health care provider. °· Ask your health care provider how to check your pulse. Check it often. °· Rest for 48 hours after the procedure or as told by your health care provider. °· Avoid or limit your caffeine use as told by your health care provider. °Contact a health care provider if: °· You feel like your heart is beating too quickly or your pulse is not regular. °· You have a serious muscle cramp that does not go away. °Get help right away if: °· You have discomfort in your chest. °· You are dizzy or you feel faint. °· You have trouble breathing or you are short of breath. °· Your speech is slurred. °· You have trouble moving an arm or leg on one side of your body. °· Your fingers or toes turn cold or blue. °This information is not intended to replace advice given to you by your health care provider. Make sure you discuss any questions you have with your health care provider. °Document Released: 11/04/2012 Document Revised: 08/18/2015 Document Reviewed: 07/21/2015 °Elsevier Interactive Patient Education © 2017 [employer] ° °

## 2016-03-15 NOTE — Transfer of Care (Signed)
Immediate Anesthesia Transfer of Care Note  Patient: Randall Chen  Procedure(s) Performed: Procedure(s): CARDIOVERSION (N/A)  Patient Location: Endoscopy Unit  Anesthesia Type:MAC  Level of Consciousness: awake, alert  and oriented  Airway & Oxygen Therapy: Patient Spontanous Breathing and Patient connected to nasal cannula oxygen  Post-op Assessment: Report given to RN, Post -op Vital signs reviewed and stable and Patient moving all extremities X 4  Post vital signs: Reviewed and stable  Last Vitals:  Vitals:   03/15/16 1133  BP: 119/71  Pulse: (!) 57  Resp: 17    Last Pain:  Vitals:   03/15/16 1133  TempSrc: Oral         Complications: No apparent anesthesia complications

## 2016-03-17 ENCOUNTER — Encounter (HOSPITAL_COMMUNITY): Payer: Self-pay | Admitting: Cardiology

## 2016-03-25 ENCOUNTER — Encounter: Payer: Self-pay | Admitting: Physician Assistant

## 2016-03-25 ENCOUNTER — Ambulatory Visit (INDEPENDENT_AMBULATORY_CARE_PROVIDER_SITE_OTHER): Payer: PPO | Admitting: Physician Assistant

## 2016-03-25 VITALS — BP 118/60 | HR 40 | Ht 73.0 in | Wt 224.8 lb

## 2016-03-25 DIAGNOSIS — I2510 Atherosclerotic heart disease of native coronary artery without angina pectoris: Secondary | ICD-10-CM | POA: Diagnosis not present

## 2016-03-25 DIAGNOSIS — I10 Essential (primary) hypertension: Secondary | ICD-10-CM | POA: Diagnosis not present

## 2016-03-25 DIAGNOSIS — I4819 Other persistent atrial fibrillation: Secondary | ICD-10-CM

## 2016-03-25 DIAGNOSIS — I481 Persistent atrial fibrillation: Secondary | ICD-10-CM

## 2016-03-25 DIAGNOSIS — R001 Bradycardia, unspecified: Secondary | ICD-10-CM

## 2016-03-25 MED ORDER — METOPROLOL TARTRATE 25 MG PO TABS: 12.5000 mg | ORAL_TABLET | Freq: Two times a day (BID) | ORAL | 3 refills | Status: DC

## 2016-03-25 NOTE — Progress Notes (Signed)
Cardiology Office Note    Date:  03/25/2016   ID:  Randall Chen, DOB 1932/04/03, MRN [id number]  PCP:  Irven Shelling, MD  Cardiologist: Dr. Radford Pax  Chief Complaint  Patient presents with  . Follow-up    History of Present Illness:  Randall Chen is a 83 y.o. male with a history of ASCAD s/p inferior MI 2003 with PCI of RCA 6/03 and then PCI of left circ/OM 2/05, dyslipidemia, bicuspid AV, mildly dilated aorta, PAF on Coumadin, OSA on CPAP and HTN.  He was in Afib with RVR and underwent DCCV 03/15/16. He is feeling quite well. Denies any chest pain, palpitations, dizziness, or presyncope. He tripped and fell in his garden and hit his right eye, but no dizziness.     Past Medical History:  Diagnosis Date  . Aortic stenosis    mild by echo 02/2012  . Coronary disease    2 Vessel,inferior wall MI june 2003, PTCA and Stent RCA June 2003, PTCA stent, Left Circumflex OM 02 Mar 2003, Dr Radford Pax  . Dilated aortic root ([place])   . DJD (degenerative joint disease) of knee    LEFT  . Dyslipidemia   . Dysrhythmia    afib  . GERD (gastroesophageal reflux disease)   . History of echocardiogram 08/2010   Bicuspid aortic valve w moderate aortic valve sclerosis,mild AS and mildly dilated aorta   . History of rotator cuff syndrome    (Right) with biceps tendinitis  . Hypertension   . Obesity   . OSA (obstructive sleep apnea)    presented with HA and drowsiness,mild sleep apnea with oxygen desats to 78%  . Prostate cancer ([place])    /BPH, radioactive seed 2007, Nesi     Past Surgical History:  Procedure Laterality Date  . CARDIAC CATHETERIZATION    . CARDIOVERSION N/A 08/27/2013   Procedure: CARDIOVERSION;  Surgeon: Sueanne Margarita, MD;  Location[facility] ENDOSCOPY;  Service: Cardiovascular;  Laterality: N/A;  . CARDIOVERSION N/A 03/15/2016   Procedure: CARDIOVERSION;  Surgeon: Dorothy Spark, MD;  Location: [facility];  Service: Cardiovascular;  Laterality: N/A;  . CORONARY  ANGIOPLASTY    . JOINT REPLACEMENT     left knee replacement 2009    Current Medications: Outpatient Medications Prior to Visit  Medication Sig Dispense Refill  . diphenhydrAMINE (BENADRYL) 25 MG tablet Take 25 mg by mouth at bedtime as needed for sleep.     Marland Kitchen ezetimibe (ZETIA) 10 MG tablet Take 1 tablet (10 mg total) by mouth daily. 90 tablet 3  . loratadine (CLARITIN) 10 MG tablet Take 10 mg by mouth daily as needed for allergies.    . nitroGLYCERIN (NITROSTAT) 0.4 MG SL tablet Place 1 tablet (0.4 mg total) under the tongue every 5 (five) minutes as needed for chest pain. 25 tablet 1  . Omega-3 Fatty Acids (FISH OIL) 1000 MG CAPS Take 2,000 mg by mouth daily.    Marland Kitchen omeprazole (PRILOSEC) 20 MG capsule Take 20 mg by mouth every other day.     . rosuvastatin (CRESTOR) 5 MG tablet Take 1 tablet (5 mg total) by mouth daily. 90 tablet 3  . tamsulosin (FLOMAX) 0.4 MG CAPS capsule Take 0.4 mg by mouth daily.    Marland Kitchen warfarin (COUMADIN) 5 MG tablet TAKE AS DIRECTED BY COUMADIN CLINIC 90 tablet 1  . zolpidem (AMBIEN) 5 MG tablet Take 2.5-5 mg by mouth at bedtime as needed for sleep.     . metoprolol tartrate (LOPRESSOR) 25 MG  tablet TAKE 1 TABLET BY MOUTH TWICE DAILY 180 tablet 3   Facility-Administered Medications Prior to Visit  Medication Dose Route Frequency Provider Last Rate Last Dose  . 0.9 %  sodium chloride infusion    Continuous PRN Lowella Dell, CRNA         Allergies:   Prednisone; Zocor [simvastatin]; and Oxycodone hcl   Social History   Social History  . Marital status: Married    Spouse name: N/A  . Number of children: N/A  . Years of education: N/A   Social History Main Topics  . Smoking status: Former Smoker    Quit date: 01/28/1958  . Smokeless tobacco: Never Used  . Alcohol use Yes     Comment: "a few a week"  . Drug use: No  . Sexual activity: Not Asked   Other Topics Concern  . None   Social History Narrative  . None     Family History:  The patient's  family history includes Cancer in his brother; Coronary artery disease in his brother and brother; Diabetes in his mother; Fainting in his brother; Heart attack in his brother and father; Liver cancer in his brother; Peripheral vascular disease in his father; Sudden death in his brother.   ROS:   Please see the history of present illness.    Review of Systems  Constitution: Negative.  HENT: Negative.   Cardiovascular: Negative.   Respiratory: Negative.   Endocrine: Negative.   Hematologic/Lymphatic: Negative.   Musculoskeletal: Negative.   Gastrointestinal: Negative.   Genitourinary: Negative.   Neurological: Negative.    All other systems reviewed and are negative.   PHYSICAL EXAM:   VS:  BP 118/60   Pulse (!) 40   Ht 6\' 1"  (1.854 m)   Wt 224 lb 12.8 oz (102 kg)   BMI 29.66 kg/m   Physical Exam  GEN: Well nourished, well developed, in no acute distress  HEENT: abrasion and some bruising above right eye. Neck: no JVD, carotid bruits, or masses Cardiac:RRR; 2/6 systolic  Respiratory:  clear to auscultation bilaterally, normal work of breathing GI: soft, nontender, nondistended, + BS Ext: without cyanosis, clubbing, or edema, Good distal pulses bilaterally Psych: euthymic mood, full affect  Wt Readings from Last 3 Encounters:  03/25/16 224 lb 12.8 oz (102 kg)  03/13/16 218 lb 12.8 oz (99.2 kg)  09/18/15 218 lb (98.9 kg)      Studies/Labs Reviewed:   EKG:  EKG is ordered today.  The ekg ordered today demonstrates Sinus bradycardia at 40 bpm  Recent Labs: 03/13/2016: ALT 24; BUN 29; Creatinine, Ser 1.24; Magnesium 1.8; Platelets 167; Potassium 4.6; Sodium 143   Lipid Panel    Component Value Date/Time   CHOL 95 (L) 03/13/2016 0840   CHOL 101 06/17/2013 0821   TRIG 89 03/13/2016 0840   TRIG 75 06/17/2013 0821   HDL 41 03/13/2016 0840   HDL 42 06/17/2013 0821   CHOLHDL 2.3 03/13/2016 0840   CHOLHDL 2.7 09/18/2015 0902   VLDL 30 09/18/2015 0902   LDLCALC 36  03/13/2016 0840   LDLCALC 44 06/17/2013 0821    Additional studies/ records that were reviewed today include:  2-D echo 9/1/17Study Conclusions   - Left ventricle: The cavity size was normal. Wall thickness was   increased in a pattern of mild LVH. Systolic function was normal.   The estimated ejection fraction was in the range of 60% to 65%.   Wall motion was normal; there were no regional wall  motion   abnormalities. Doppler parameters are consistent with abnormal   left ventricular relaxation (grade 1 diastolic dysfunction). The   E/e&' ratio is <8, suggesting normal LV filling pressure. - Aortic valve: Calcified aortic valve with fusion of the left and   right coronary cusps. Mild stenosis. There was trivial   regurgitation. Mean gradient (S): 10 mm Hg. Peak gradient (S): 21   mm Hg. Valve area (VTI): 1.73 cm^2. Valve area (Vmean): 1.78   cm^2. - Aorta: Aortic root dimension: 40 mm (ED). - Aortic root: The aortic root is mildly dilated. - Mitral valve: Mildly thickened leaflets . There was trivial   regurgitation. - Left atrium: Moderately dilated. - Right atrium: Moderately dilated. - Inferior vena cava: The vessel was normal in size. The   respirophasic diameter changes were in the normal range (>= 50%),   consistent with normal central venous pressure.   Impressions:   - Compared to a prior study in 2016, there have been few changes.   The bicuspid (fused L/R coronary cusps) aortic valve is mildly   stenotic. The aortic root is mildly dilated at 4.0 cm.       ASSESSMENT:    1. Persistent atrial fibrillation ([place])   2. Atherosclerosis of native coronary artery of native heart without angina pectoris   3. Essential hypertension, benign   4. Bradycardia      PLAN:  In order of problems listed above:  PAF status post DC CV to normal sinus rhythm. Heart rate is 40 today. Patient is asymptomatic. His heart rate was 42 and 03/2015. When he was in atrial fibrillation  his heart rate was in the 50s. Recommend decrease metoprolol to 12.5 mg twice a day. When he runs out we can change it to long-acting 25 mg once daily. Follow-up with Dr. Radford Pax this months or sooner if needed.  CAD without angina  Hypertension controlled  Sinus bradycardia at 40 bpm decrease metoprolol to 12.5 mg twice a day. Patient keeps track of his blood pressure and heart rate at home. He'll call us if it becomes fast or slow.    Medication Adjustments/Labs and Tests Ordered: Current medicines are reviewed at length with the patient today.  Concerns regarding medicines are outlined above.  Medication changes, Labs and Tests ordered today are listed in the Patient Instructions below. Patient Instructions  Your physician has recommended you make the following change in your medication: DECREASE  METOPROLOL TO 12.5 MG   TWICE  DAILY   WHEN FINISHES   CHANGE  TO METOPROLOL SUCCINATE  25 MG  DAILY   Your physician wants you to follow-up in: 6 MONTHS WITH DR  Radford Pax   You will receive a reminder letter in the mail two months in advance. If you don't receive a letter, please call our office to schedule the follow-up appointment.     Sumner Boast, PA-C  03/25/2016 9:54 AM    [facility] [address], [place], [place]  [address] Phone: [phone]; Fax: [phone]

## 2016-03-25 NOTE — Patient Instructions (Signed)
Your physician has recommended you make the following change in your medication: DECREASE  METOPROLOL TO 12.5 MG   TWICE  DAILY   WHEN FINISHES   CHANGE  TO METOPROLOL SUCCINATE  25 MG  DAILY   Your physician wants you to follow-up in: 6 MONTHS WITH DR  Radford Pax   You will receive a reminder letter in the mail two months in advance. If you don't receive a letter, please call our office to schedule the follow-up appointment.

## 2016-04-02 DIAGNOSIS — H2512 Age-related nuclear cataract, left eye: Secondary | ICD-10-CM | POA: Diagnosis not present

## 2016-04-02 DIAGNOSIS — H2181 Floppy iris syndrome: Secondary | ICD-10-CM | POA: Diagnosis not present

## 2016-04-02 DIAGNOSIS — H25812 Combined forms of age-related cataract, left eye: Secondary | ICD-10-CM | POA: Diagnosis not present

## 2016-04-02 DIAGNOSIS — H5703 Miosis: Secondary | ICD-10-CM | POA: Diagnosis not present

## 2016-04-02 DIAGNOSIS — H21562 Pupillary abnormality, left eye: Secondary | ICD-10-CM | POA: Diagnosis not present

## 2016-04-09 DIAGNOSIS — Z85828 Personal history of other malignant neoplasm of skin: Secondary | ICD-10-CM | POA: Diagnosis not present

## 2016-04-09 DIAGNOSIS — L570 Actinic keratosis: Secondary | ICD-10-CM | POA: Diagnosis not present

## 2016-04-09 DIAGNOSIS — D229 Melanocytic nevi, unspecified: Secondary | ICD-10-CM | POA: Diagnosis not present

## 2016-04-14 ENCOUNTER — Other Ambulatory Visit: Payer: Self-pay | Admitting: Cardiology

## 2016-04-16 ENCOUNTER — Telehealth: Payer: Self-pay | Admitting: Cardiology

## 2016-04-16 NOTE — Telephone Encounter (Signed)
1. What dental office are you calling from?Dr Freddrick March   2. What is your office phone and fax number? [phone] and fax is [phone]   3. What type of procedure is the patient having performed? Root Canal  4. What date is procedure scheduled? 04-22-16   5. What is your question (ex. Antibiotics prior to procedure, holding medication-we need to know how long dentist wants pt to hold med)? Does pt need to be pre medicated before surgery

## 2016-04-17 NOTE — Telephone Encounter (Signed)
Pt can continue Coumadin for root canal. Need for pre op antibiotics to be addressed by Dr Radford Pax.

## 2016-04-17 NOTE — Telephone Encounter (Signed)
He has a congenital bicuspid AV so would recommend SBE prophylactic antibx

## 2016-04-18 NOTE — Telephone Encounter (Signed)
Faxed to Dr. Tarri Glenn at [phone].

## 2016-04-22 DIAGNOSIS — H2511 Age-related nuclear cataract, right eye: Secondary | ICD-10-CM | POA: Diagnosis not present

## 2016-04-22 DIAGNOSIS — H25011 Cortical age-related cataract, right eye: Secondary | ICD-10-CM | POA: Diagnosis not present

## 2016-04-24 ENCOUNTER — Ambulatory Visit (INDEPENDENT_AMBULATORY_CARE_PROVIDER_SITE_OTHER): Payer: PPO | Admitting: *Deleted

## 2016-04-24 DIAGNOSIS — Z5181 Encounter for therapeutic drug level monitoring: Secondary | ICD-10-CM

## 2016-04-24 DIAGNOSIS — I480 Paroxysmal atrial fibrillation: Secondary | ICD-10-CM | POA: Diagnosis not present

## 2016-04-24 DIAGNOSIS — I481 Persistent atrial fibrillation: Secondary | ICD-10-CM

## 2016-04-24 DIAGNOSIS — I4819 Other persistent atrial fibrillation: Secondary | ICD-10-CM

## 2016-04-24 LAB — POCT INR: INR: 3.1

## 2016-04-30 DIAGNOSIS — H25811 Combined forms of age-related cataract, right eye: Secondary | ICD-10-CM | POA: Diagnosis not present

## 2016-04-30 DIAGNOSIS — H2511 Age-related nuclear cataract, right eye: Secondary | ICD-10-CM | POA: Diagnosis not present

## 2016-06-05 ENCOUNTER — Ambulatory Visit (INDEPENDENT_AMBULATORY_CARE_PROVIDER_SITE_OTHER): Payer: PPO | Admitting: *Deleted

## 2016-06-05 DIAGNOSIS — I4819 Other persistent atrial fibrillation: Secondary | ICD-10-CM

## 2016-06-05 DIAGNOSIS — I480 Paroxysmal atrial fibrillation: Secondary | ICD-10-CM | POA: Diagnosis not present

## 2016-06-05 DIAGNOSIS — Z5181 Encounter for therapeutic drug level monitoring: Secondary | ICD-10-CM | POA: Diagnosis not present

## 2016-06-05 DIAGNOSIS — I481 Persistent atrial fibrillation: Secondary | ICD-10-CM

## 2016-06-05 LAB — POCT INR: INR: 2.1

## 2016-06-11 ENCOUNTER — Other Ambulatory Visit: Payer: Self-pay | Admitting: Cardiology

## 2016-06-17 ENCOUNTER — Other Ambulatory Visit: Payer: Self-pay | Admitting: *Deleted

## 2016-06-17 NOTE — Telephone Encounter (Signed)
Patient called and wanted to know why the refill request for amoxicillin was refused. He stated that he has a dental appointment in June. Okay to refill? If okay, how many tabs? Please advise. Thanks, MI

## 2016-07-08 DIAGNOSIS — R51 Headache: Secondary | ICD-10-CM | POA: Diagnosis not present

## 2016-07-16 DIAGNOSIS — C44319 Basal cell carcinoma of skin of other parts of face: Secondary | ICD-10-CM | POA: Diagnosis not present

## 2016-07-16 DIAGNOSIS — L570 Actinic keratosis: Secondary | ICD-10-CM | POA: Diagnosis not present

## 2016-07-16 DIAGNOSIS — R202 Paresthesia of skin: Secondary | ICD-10-CM | POA: Diagnosis not present

## 2016-07-17 ENCOUNTER — Ambulatory Visit (INDEPENDENT_AMBULATORY_CARE_PROVIDER_SITE_OTHER): Payer: PPO | Admitting: Pharmacist

## 2016-07-17 DIAGNOSIS — I4819 Other persistent atrial fibrillation: Secondary | ICD-10-CM

## 2016-07-17 DIAGNOSIS — I481 Persistent atrial fibrillation: Secondary | ICD-10-CM

## 2016-07-17 DIAGNOSIS — I480 Paroxysmal atrial fibrillation: Secondary | ICD-10-CM | POA: Diagnosis not present

## 2016-07-17 DIAGNOSIS — Z5181 Encounter for therapeutic drug level monitoring: Secondary | ICD-10-CM | POA: Diagnosis not present

## 2016-07-17 LAB — POCT INR: INR: 2.2

## 2016-07-25 ENCOUNTER — Telehealth: Payer: Self-pay | Admitting: *Deleted

## 2016-07-25 NOTE — Telephone Encounter (Signed)
Advised pt, per ADA guidelines he does not need abx prophylaxis prior to dental procedure/s. Patient verbalized understanding and agreeable to plan.

## 2016-07-25 NOTE — Telephone Encounter (Signed)
Patient left a msg on the refill vm stating that he has a dental appointment this afternoon and needs an antibiotic. No further information was given. Please advise. Thanks, MI

## 2016-07-30 DIAGNOSIS — H02054 Trichiasis without entropian left upper eyelid: Secondary | ICD-10-CM | POA: Diagnosis not present

## 2016-08-02 ENCOUNTER — Other Ambulatory Visit: Payer: Self-pay | Admitting: Cardiology

## 2016-08-02 MED ORDER — EZETIMIBE 10 MG PO TABS: 10.0000 mg | ORAL_TABLET | Freq: Every day | ORAL | 2 refills | Status: DC

## 2016-08-02 MED ORDER — ROSUVASTATIN CALCIUM 5 MG PO TABS: 5.0000 mg | ORAL_TABLET | Freq: Every day | ORAL | 2 refills | Status: DC

## 2016-08-02 NOTE — Telephone Encounter (Signed)
Called pt , VM is not set up, to inform pt that his Rx's were being left at the front desk, as he requested, for pt to pick up.

## 2016-08-15 DIAGNOSIS — L570 Actinic keratosis: Secondary | ICD-10-CM | POA: Diagnosis not present

## 2016-08-15 DIAGNOSIS — C44319 Basal cell carcinoma of skin of other parts of face: Secondary | ICD-10-CM | POA: Diagnosis not present

## 2016-08-20 ENCOUNTER — Telehealth (HOSPITAL_COMMUNITY): Payer: Self-pay | Admitting: Cardiology

## 2016-08-20 NOTE — Telephone Encounter (Signed)
I called patient and lmsg informing him of his appt time change on 10/09/16. I informed him that his time would be changing from 7:00 to 7:30am .

## 2016-08-23 ENCOUNTER — Other Ambulatory Visit: Payer: Self-pay | Admitting: Cardiology

## 2016-08-28 ENCOUNTER — Ambulatory Visit (INDEPENDENT_AMBULATORY_CARE_PROVIDER_SITE_OTHER): Payer: PPO | Admitting: *Deleted

## 2016-08-28 DIAGNOSIS — Z5181 Encounter for therapeutic drug level monitoring: Secondary | ICD-10-CM | POA: Diagnosis not present

## 2016-08-28 DIAGNOSIS — I480 Paroxysmal atrial fibrillation: Secondary | ICD-10-CM

## 2016-08-28 DIAGNOSIS — I481 Persistent atrial fibrillation: Secondary | ICD-10-CM | POA: Diagnosis not present

## 2016-08-28 DIAGNOSIS — I4819 Other persistent atrial fibrillation: Secondary | ICD-10-CM

## 2016-08-28 LAB — POCT INR: INR: 1.5

## 2016-09-12 ENCOUNTER — Ambulatory Visit (INDEPENDENT_AMBULATORY_CARE_PROVIDER_SITE_OTHER): Payer: PPO | Admitting: *Deleted

## 2016-09-12 DIAGNOSIS — I481 Persistent atrial fibrillation: Secondary | ICD-10-CM | POA: Diagnosis not present

## 2016-09-12 DIAGNOSIS — I480 Paroxysmal atrial fibrillation: Secondary | ICD-10-CM | POA: Diagnosis not present

## 2016-09-12 DIAGNOSIS — Z5181 Encounter for therapeutic drug level monitoring: Secondary | ICD-10-CM

## 2016-09-12 DIAGNOSIS — I4819 Other persistent atrial fibrillation: Secondary | ICD-10-CM

## 2016-09-12 LAB — POCT INR: INR: 2.3

## 2016-10-09 ENCOUNTER — Ambulatory Visit (INDEPENDENT_AMBULATORY_CARE_PROVIDER_SITE_OTHER): Payer: PPO | Admitting: *Deleted

## 2016-10-09 ENCOUNTER — Telehealth: Payer: Self-pay

## 2016-10-09 ENCOUNTER — Encounter: Payer: Self-pay | Admitting: Cardiology

## 2016-10-09 ENCOUNTER — Other Ambulatory Visit: Payer: Self-pay

## 2016-10-09 ENCOUNTER — Ambulatory Visit (HOSPITAL_COMMUNITY): Payer: PPO | Attending: Cardiovascular Disease

## 2016-10-09 DIAGNOSIS — E669 Obesity, unspecified: Secondary | ICD-10-CM | POA: Insufficient documentation

## 2016-10-09 DIAGNOSIS — E785 Hyperlipidemia, unspecified: Secondary | ICD-10-CM | POA: Diagnosis not present

## 2016-10-09 DIAGNOSIS — Q231 Congenital insufficiency of aortic valve: Secondary | ICD-10-CM

## 2016-10-09 DIAGNOSIS — I77810 Thoracic aortic ectasia: Secondary | ICD-10-CM | POA: Insufficient documentation

## 2016-10-09 DIAGNOSIS — I481 Persistent atrial fibrillation: Secondary | ICD-10-CM | POA: Diagnosis not present

## 2016-10-09 DIAGNOSIS — G4733 Obstructive sleep apnea (adult) (pediatric): Secondary | ICD-10-CM | POA: Diagnosis not present

## 2016-10-09 DIAGNOSIS — Z8249 Family history of ischemic heart disease and other diseases of the circulatory system: Secondary | ICD-10-CM | POA: Diagnosis not present

## 2016-10-09 DIAGNOSIS — I119 Hypertensive heart disease without heart failure: Secondary | ICD-10-CM | POA: Diagnosis not present

## 2016-10-09 DIAGNOSIS — Z87891 Personal history of nicotine dependence: Secondary | ICD-10-CM | POA: Insufficient documentation

## 2016-10-09 DIAGNOSIS — I480 Paroxysmal atrial fibrillation: Secondary | ICD-10-CM | POA: Diagnosis not present

## 2016-10-09 DIAGNOSIS — Z5181 Encounter for therapeutic drug level monitoring: Secondary | ICD-10-CM | POA: Diagnosis not present

## 2016-10-09 DIAGNOSIS — Z6829 Body mass index (BMI) 29.0-29.9, adult: Secondary | ICD-10-CM | POA: Diagnosis not present

## 2016-10-09 DIAGNOSIS — I4891 Unspecified atrial fibrillation: Secondary | ICD-10-CM | POA: Diagnosis not present

## 2016-10-09 DIAGNOSIS — I350 Nonrheumatic aortic (valve) stenosis: Secondary | ICD-10-CM

## 2016-10-09 DIAGNOSIS — I2510 Atherosclerotic heart disease of native coronary artery without angina pectoris: Secondary | ICD-10-CM | POA: Insufficient documentation

## 2016-10-09 DIAGNOSIS — I4819 Other persistent atrial fibrillation: Secondary | ICD-10-CM

## 2016-10-09 LAB — POCT INR: INR: 2.1

## 2016-10-09 NOTE — Telephone Encounter (Signed)
Informed patient of results and verbal understanding expressed.  Repeat ECHO ordered to be scheduled in 1 year. Patient agrees with treatment plan. 

## 2016-10-09 NOTE — Telephone Encounter (Signed)
-----   Message from Sueanne Margarita, MD sent at 10/09/2016  3:15 PM EDT ----- Echo showed severe LVH with normal LVF and mild AS with bicuspid AV and moderate LAE and stable mild dilated aortic root - repeat echo in 1year

## 2016-10-11 ENCOUNTER — Telehealth: Payer: Self-pay | Admitting: Cardiology

## 2016-10-11 ENCOUNTER — Encounter: Payer: Self-pay | Admitting: Cardiology

## 2016-10-11 NOTE — Telephone Encounter (Signed)
F/u message  Pt returning RN call about echo. Please call back to discuss

## 2016-10-11 NOTE — Telephone Encounter (Signed)
I spoke with pt and told him Dr. Radford Pax would like him to be seen in Atrial fib clinic. I scheduled pt to be seen in clinic on 10/14/16 at 2:00

## 2016-10-14 ENCOUNTER — Encounter (HOSPITAL_COMMUNITY): Payer: Self-pay | Admitting: Nurse Practitioner

## 2016-10-14 ENCOUNTER — Ambulatory Visit (HOSPITAL_COMMUNITY)
Admission: RE | Admit: 2016-10-14 | Discharge: 2016-10-14 | Disposition: A | Discharge status: Home / Self Care | Payer: PPO | Source: Ambulatory Visit | Attending: Nurse Practitioner | Admitting: Nurse Practitioner

## 2016-10-14 ENCOUNTER — Other Ambulatory Visit: Payer: Self-pay

## 2016-10-14 VITALS — BP 112/68 | HR 74 | Ht 73.0 in | Wt 232.6 lb

## 2016-10-14 DIAGNOSIS — G4733 Obstructive sleep apnea (adult) (pediatric): Secondary | ICD-10-CM | POA: Insufficient documentation

## 2016-10-14 DIAGNOSIS — Z7901 Long term (current) use of anticoagulants: Secondary | ICD-10-CM | POA: Diagnosis not present

## 2016-10-14 DIAGNOSIS — Z87891 Personal history of nicotine dependence: Secondary | ICD-10-CM | POA: Insufficient documentation

## 2016-10-14 DIAGNOSIS — I444 Left anterior fascicular block: Secondary | ICD-10-CM | POA: Insufficient documentation

## 2016-10-14 DIAGNOSIS — K219 Gastro-esophageal reflux disease without esophagitis: Secondary | ICD-10-CM | POA: Insufficient documentation

## 2016-10-14 DIAGNOSIS — Z955 Presence of coronary angioplasty implant and graft: Secondary | ICD-10-CM | POA: Diagnosis not present

## 2016-10-14 DIAGNOSIS — E669 Obesity, unspecified: Secondary | ICD-10-CM | POA: Diagnosis not present

## 2016-10-14 DIAGNOSIS — I10 Essential (primary) hypertension: Secondary | ICD-10-CM | POA: Insufficient documentation

## 2016-10-14 DIAGNOSIS — N400 Enlarged prostate without lower urinary tract symptoms: Secondary | ICD-10-CM | POA: Diagnosis not present

## 2016-10-14 DIAGNOSIS — Q231 Congenital insufficiency of aortic valve: Secondary | ICD-10-CM | POA: Insufficient documentation

## 2016-10-14 DIAGNOSIS — I480 Paroxysmal atrial fibrillation: Secondary | ICD-10-CM

## 2016-10-14 DIAGNOSIS — E785 Hyperlipidemia, unspecified: Secondary | ICD-10-CM | POA: Insufficient documentation

## 2016-10-14 DIAGNOSIS — I350 Nonrheumatic aortic (valve) stenosis: Secondary | ICD-10-CM | POA: Diagnosis not present

## 2016-10-14 DIAGNOSIS — I77819 Aortic ectasia, unspecified site: Secondary | ICD-10-CM | POA: Insufficient documentation

## 2016-10-14 DIAGNOSIS — I2510 Atherosclerotic heart disease of native coronary artery without angina pectoris: Secondary | ICD-10-CM | POA: Diagnosis not present

## 2016-10-14 DIAGNOSIS — I252 Old myocardial infarction: Secondary | ICD-10-CM | POA: Diagnosis not present

## 2016-10-14 LAB — COMPREHENSIVE METABOLIC PANEL
ALT: 29 U/L (ref 17–63)
AST: 36 U/L (ref 15–41)
Albumin: 3.9 g/dL (ref 3.5–5.0)
Alkaline Phosphatase: 53 U/L (ref 38–126)
Anion gap: 5 (ref 5–15)
BUN: 24 mg/dL — ABNORMAL HIGH (ref 6–20)
CO2: 28 mmol/L (ref 22–32)
Calcium: 9 mg/dL (ref 8.9–10.3)
Chloride: 107 mmol/L (ref 101–111)
Creatinine, Ser: 1.27 mg/dL — ABNORMAL HIGH (ref 0.61–1.24)
GFR calc Af Amer: 59 mL/min — ABNORMAL LOW (ref >60)
GFR calc non Af Amer: 51 mL/min — ABNORMAL LOW (ref >60)
Glucose, Bld: 153 mg/dL — ABNORMAL HIGH (ref 65–99)
Potassium: 3.9 mmol/L (ref 3.5–5.1)
Sodium: 140 mmol/L (ref 135–145)
Total Bilirubin: 0.6 mg/dL (ref 0.3–1.2)
Total Protein: 6.9 g/dL (ref 6.5–8.1)

## 2016-10-14 LAB — TSH: TSH: 2.193 u[IU]/mL (ref 0.350–4.500)

## 2016-10-14 LAB — CBC
HCT: 42.9 % (ref 39.0–52.0)
Hemoglobin: 14.2 g/dL (ref 13.0–17.0)
MCH: 31.1 pg (ref 26.0–34.0)
MCHC: 33.1 g/dL (ref 30.0–36.0)
MCV: 93.9 fL (ref 78.0–100.0)
Platelets: 163 10*3/uL (ref 150–400)
RBC: 4.57 MIL/uL (ref 4.22–5.81)
RDW: 14 % (ref 11.5–15.5)
WBC: 6.2 10*3/uL (ref 4.0–10.5)

## 2016-10-14 LAB — PROTIME-INR
INR: 2.01
Prothrombin Time: 22.6 seconds — ABNORMAL HIGH (ref 11.4–15.2)

## 2016-10-14 NOTE — Patient Instructions (Signed)
Cardioversion scheduled for Friday, September 21st  -Coumadin check at [facility] st office at 8:45am  - Arrive at the [facility] and go to admitting at 9:30am  -Do not eat or drink anything after midnight the night prior to your procedure.  - Take all your medication with a sip of water prior to arrival.  - You will not be able to drive home after your procedure.

## 2016-10-15 NOTE — Progress Notes (Signed)
Primary Care Physician: Lavone Orn, MD Referring Physician: [name] is a 83 y.o. male with a h/o CAD, bicuspid aortic valve with mild aortic stenosis, paroxysmal afib that presented for an echo and found to be in afib.In the afib clinic today, he states that he has been in afib for about one week. It followed a gambling weekend in [place], where he may have had slighly more alcohol than usual. Usually drinks one glass of wine a night. He had a successful cardioversion in February and has not has any issues until now. He is on warfarin for a chadsvasc score of at least 4.   Today, he denies symptoms of palpitations, chest pain, shortness of breath, orthopnea, PND, lower extremity edema, dizziness, presyncope, syncope, or neurologic sequela. The patient is tolerating medications without difficulties and is otherwise without complaint today.   Past Medical History:  Diagnosis Date  . Aortic stenosis    mild by echo 09/2016  . Coronary disease    2 Vessel,inferior wall MI june 2003, PTCA and Stent RCA June 2003, PTCA stent, Left Circumflex OM 02 Mar 2003, Dr Radford Pax  . Dilated aortic root ([place])    48mm by echo 09/2016  . DJD (degenerative joint disease) of knee    LEFT  . Dyslipidemia   . Dysrhythmia    afib  . GERD (gastroesophageal reflux disease)   . History of echocardiogram 08/2010   Bicuspid aortic valve w moderate aortic valve sclerosis,mild AS and mildly dilated aorta   . History of rotator cuff syndrome    (Right) with biceps tendinitis  . Hypertension   . Obesity   . OSA (obstructive sleep apnea)    presented with HA and drowsiness,mild sleep apnea with oxygen desats to 78%  . Prostate cancer ([place])    /BPH, radioactive seed 2007, Nesi    Past Surgical History:  Procedure Laterality Date  . CARDIAC CATHETERIZATION    . CARDIOVERSION N/A 08/27/2013   Procedure: CARDIOVERSION;  Surgeon: Sueanne Margarita, MD;  Location: [facility] ENDOSCOPY;  Service:  Cardiovascular;  Laterality: N/A;  . CARDIOVERSION N/A 03/15/2016   Procedure: CARDIOVERSION;  Surgeon: Dorothy Spark, MD;  Location: [place];  Service: Cardiovascular;  Laterality: N/A;  . CORONARY ANGIOPLASTY    . JOINT REPLACEMENT     left knee replacement 2009    Current Outpatient Prescriptions  Medication Sig Dispense Refill  . diphenhydrAMINE (BENADRYL) 25 MG tablet Take 25 mg by mouth at bedtime as needed for sleep.     Marland Kitchen ezetimibe (ZETIA) 10 MG tablet Take 1 tablet (10 mg total) by mouth daily. 90 tablet 2  . loratadine (CLARITIN) 10 MG tablet Take 10 mg by mouth daily as needed for allergies.    . metoprolol tartrate (LOPRESSOR) 25 MG tablet Take 0.5 tablets (12.5 mg total) by mouth 2 (two) times daily. 180 tablet 3  . nitroGLYCERIN (NITROSTAT) 0.4 MG SL tablet Place 1 tablet (0.4 mg total) under the tongue every 5 (five) minutes as needed for chest pain. 25 tablet 1  . Omega-3 Fatty Acids (FISH OIL) 1000 MG CAPS Take 2,000 mg by mouth daily.    Marland Kitchen omeprazole (PRILOSEC) 20 MG capsule Take 20 mg by mouth every other day.     . rosuvastatin (CRESTOR) 5 MG tablet Take 1 tablet (5 mg total) by mouth daily. 90 tablet 2  . tamsulosin (FLOMAX) 0.4 MG CAPS capsule Take 0.4 mg by mouth daily.    Marland Kitchen warfarin (  COUMADIN) 5 MG tablet TAKE BY MOUTH AS DIRECTED BY COUMADIN CLINIC 90 tablet 0  . zolpidem (AMBIEN) 5 MG tablet Take 2.5-5 mg by mouth at bedtime as needed for sleep.      No current facility-administered medications for this encounter.    Facility-Administered Medications Ordered in Other Encounters  Medication Dose Route Frequency Provider Last Rate Last Dose  . 0.9 %  sodium chloride infusion    Continuous PRN Lowella Dell, CRNA        Allergies  Allergen Reactions  . Prednisone Other (See Comments)    REACTION: anxiety reaction  . Zocor [Simvastatin] Other (See Comments)    REACTION: Muscle aches  . Oxycodone Hcl Rash    Social History   Social History  .  Marital status: Married    Spouse name: N/A  . Number of children: N/A  . Years of education: N/A   Occupational History  . Not on file.   Social History Main Topics  . Smoking status: Former Smoker    Quit date: 01/28/1958  . Smokeless tobacco: Never Used  . Alcohol use Yes     Comment: "a few a week"  . Drug use: No  . Sexual activity: Not on file   Other Topics Concern  . Not on file   Social History Narrative  . No narrative on file    Family History  Problem Relation Age of Onset  . Diabetes Mother   . Peripheral vascular disease Father   . Heart attack Father   . Coronary artery disease Brother   . Liver cancer Brother   . Coronary artery disease Brother   . Heart attack Brother   . Cancer Brother   . Sudden death Brother   . Fainting Brother     ROS- All systems are reviewed and negative except as per the HPI above  Physical Exam: Vitals:   10/14/16 1403  BP: 112/68  Pulse: 74  Weight: 232 lb 9.6 oz (105.5 kg)  Height: 6\' 1"  (1.854 m)   Wt Readings from Last 3 Encounters:  10/14/16 232 lb 9.6 oz (105.5 kg)  03/25/16 224 lb 12.8 oz (102 kg)  03/13/16 218 lb 12.8 oz (99.2 kg)    Labs: Lab Results  Component Value Date   NA 140 10/14/2016   K 3.9 10/14/2016   CL 107 10/14/2016   CO2 28 10/14/2016   GLUCOSE 153 (H) 10/14/2016   BUN 24 (H) 10/14/2016   CREATININE 1.27 (H) 10/14/2016   CALCIUM 9.0 10/14/2016   MG 1.8 03/13/2016   Lab Results  Component Value Date   INR 2.01 10/14/2016   Lab Results  Component Value Date   CHOL 95 (L) 03/13/2016   HDL 41 03/13/2016   LDLCALC 36 03/13/2016   TRIG 89 03/13/2016     GEN- The patient is well appearing, alert and oriented x 3 today.   Head- normocephalic, atraumatic Eyes-  Sclera clear, conjunctiva pink Ears- hearing intact Oropharynx- clear Neck- supple, no JVP Lymph- no cervical lymphadenopathy Lungs- Clear to ausculation bilaterally, normal work of breathing Heart- Regular rate and  rhythm, no murmurs, rubs or gallops, PMI not laterally displaced GI- soft, NT, ND, + BS Extremities- no clubbing, cyanosis, or edema MS- no significant deformity or atrophy Skin- no rash or lesion Psych- euthymic mood, full affect Neuro- strength and sensation are intact  EKG-afib at 74 bpm Echo-Study Conclusions  - Left ventricle: Wall thickness was increased in a pattern of  severe LVH. The estimated ejection fraction was 55%. - Aortic valve: MIld AS gradients stable since September 2017.   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 Valve area (VTI):   2.92 cm^2. Valve area (Vmax): 2.71 cm^2. Valve area (Vmean): 2.56   cm^2. - Left atrium: The atrium was moderately dilated. - Right ventricle: The cavity size was mildly dilated. - Atrial septum: No defect or patent foramen ovale was identified.    Assessment and Plan: 1. Paroxysmal afib Pt given options of restoring SR  He has never been on antiarrythmic therapy and would not be a candidate for flecainide for structural heart changes and CAD. Tikosyn and  sotalol were discussed in detail RIght now he is not willing to commit to either one He would like to try another cardioversion with lifestyle issues as losing weight and cutting back on alcohol He is on warfarin and does not want to wait for the therapeutic 4 week INR's Will schedule  for TEE guided cardioversion this Friday CBC/Bmet,tsh,INR today Will have INR at Coumadin clinc am of procedure   F/u with Dr. Radford Pax 9/25 as already scheduled  Butch Penny C. Drevion Offord, [facility] [address] [place], [place] [address] [phone]

## 2016-10-18 ENCOUNTER — Telehealth (HOSPITAL_COMMUNITY): Payer: Self-pay | Admitting: *Deleted

## 2016-10-18 ENCOUNTER — Other Ambulatory Visit (HOSPITAL_COMMUNITY): Payer: Self-pay | Admitting: *Deleted

## 2016-10-18 ENCOUNTER — Encounter (HOSPITAL_COMMUNITY): Admission: RE | Payer: Self-pay | Source: Ambulatory Visit

## 2016-10-18 ENCOUNTER — Ambulatory Visit (INDEPENDENT_AMBULATORY_CARE_PROVIDER_SITE_OTHER): Payer: PPO | Admitting: *Deleted

## 2016-10-18 ENCOUNTER — Ambulatory Visit (HOSPITAL_COMMUNITY): Admission: RE | Admit: 2016-10-18 | Payer: PPO | Source: Ambulatory Visit | Admitting: Cardiology

## 2016-10-18 DIAGNOSIS — I4819 Other persistent atrial fibrillation: Secondary | ICD-10-CM

## 2016-10-18 DIAGNOSIS — I480 Paroxysmal atrial fibrillation: Secondary | ICD-10-CM | POA: Diagnosis not present

## 2016-10-18 DIAGNOSIS — Z5181 Encounter for therapeutic drug level monitoring: Secondary | ICD-10-CM

## 2016-10-18 DIAGNOSIS — I481 Persistent atrial fibrillation: Secondary | ICD-10-CM

## 2016-10-18 LAB — POCT INR: INR: 1.9

## 2016-10-18 SURGERY — ECHOCARDIOGRAM, TRANSESOPHAGEAL
Anesthesia: Monitor Anesthesia Care

## 2016-10-18 NOTE — Telephone Encounter (Signed)
Notified by coumadin clinic of patient INR being subtherapeutic at 1.9 this morning. TEE/DCCV canceled due to subtherapeutic. Will discuss with Roderic Palau NP on timing of rescheduling.

## 2016-10-18 NOTE — Telephone Encounter (Signed)
Pt rescheduled for 10/3 at 1pm. He will go to coumadin clinic prior to arrival for INR check prior to TEE/DCCV. He will have a coumadin check on 9/28 to ensure he is back therapeutic since he was 1.9 today. He will have labs redrawn at the lab on 9/28 as well. He would like to follow up with Dr. Radford Pax after cardioversion -- have requested his appt for 9/25 be pushed out a few weeks. Pt verbalized understanding.

## 2016-10-22 ENCOUNTER — Ambulatory Visit: Payer: PPO | Admitting: Cardiology

## 2016-10-25 ENCOUNTER — Other Ambulatory Visit: Payer: PPO | Admitting: *Deleted

## 2016-10-25 ENCOUNTER — Ambulatory Visit (INDEPENDENT_AMBULATORY_CARE_PROVIDER_SITE_OTHER): Payer: PPO | Admitting: *Deleted

## 2016-10-25 DIAGNOSIS — I10 Essential (primary) hypertension: Secondary | ICD-10-CM

## 2016-10-25 DIAGNOSIS — E7800 Pure hypercholesterolemia, unspecified: Secondary | ICD-10-CM

## 2016-10-25 DIAGNOSIS — I2510 Atherosclerotic heart disease of native coronary artery without angina pectoris: Secondary | ICD-10-CM | POA: Diagnosis not present

## 2016-10-25 DIAGNOSIS — I4819 Other persistent atrial fibrillation: Secondary | ICD-10-CM

## 2016-10-25 DIAGNOSIS — I480 Paroxysmal atrial fibrillation: Secondary | ICD-10-CM | POA: Diagnosis not present

## 2016-10-25 DIAGNOSIS — Z5181 Encounter for therapeutic drug level monitoring: Secondary | ICD-10-CM

## 2016-10-25 LAB — POCT INR: INR: 2.7

## 2016-10-25 NOTE — Addendum Note (Signed)
Addended by: Eulis Foster on: 10/25/2016 02:39 PM   Modules accepted: Orders

## 2016-10-25 NOTE — Progress Notes (Signed)
Bmp

## 2016-10-25 NOTE — Addendum Note (Signed)
Addended by: Eulis Foster on: 10/25/2016 02:38 PM   Modules accepted: Orders

## 2016-10-26 LAB — CBC WITH DIFFERENTIAL/PLATELET
Basophils Absolute: 0 10*3/uL (ref 0.0–0.2)
Basos: 0 %
EOS (ABSOLUTE): 0.1 10*3/uL (ref 0.0–0.4)
Eos: 2 %
Hematocrit: 41.4 % (ref 37.5–51.0)
Hemoglobin: 13.6 g/dL (ref 13.0–17.7)
Immature Grans (Abs): 0 10*3/uL (ref 0.0–0.1)
Immature Granulocytes: 0 %
Lymphocytes Absolute: 2.5 10*3/uL (ref 0.7–3.1)
Lymphs: 40 %
MCH: 30.8 pg (ref 26.6–33.0)
MCHC: 32.9 g/dL (ref 31.5–35.7)
MCV: 94 fL (ref 79–97)
Monocytes Absolute: 0.7 10*3/uL (ref 0.1–0.9)
Monocytes: 11 %
Neutrophils Absolute: 3 10*3/uL (ref 1.4–7.0)
Neutrophils: 47 %
Platelets: 155 10*3/uL (ref 150–379)
RBC: 4.41 x10E6/uL (ref 4.14–5.80)
RDW: 14.1 % (ref 12.3–15.4)
WBC: 6.3 10*3/uL (ref 3.4–10.8)

## 2016-10-26 LAB — BASIC METABOLIC PANEL
BUN/Creatinine Ratio: 23 (ref 10–24)
BUN: 29 mg/dL — ABNORMAL HIGH (ref 8–27)
CO2: 23 mmol/L (ref 20–29)
Calcium: 9.2 mg/dL (ref 8.6–10.2)
Chloride: 106 mmol/L (ref 96–106)
Creatinine, Ser: 1.25 mg/dL (ref 0.76–1.27)
GFR calc Af Amer: 62 mL/min/{1.73_m2} (ref >59)
GFR calc non Af Amer: 53 mL/min/{1.73_m2} — ABNORMAL LOW (ref >59)
Glucose: 103 mg/dL — ABNORMAL HIGH (ref 65–99)
Potassium: 4.5 mmol/L (ref 3.5–5.2)
Sodium: 143 mmol/L (ref 134–144)

## 2016-10-30 ENCOUNTER — Encounter (HOSPITAL_COMMUNITY): Payer: Self-pay | Admitting: *Deleted

## 2016-10-30 ENCOUNTER — Encounter (HOSPITAL_COMMUNITY)
Admission: RE | Disposition: A | Discharge status: Home / Self Care | Payer: Self-pay | Source: Ambulatory Visit | Attending: Cardiology

## 2016-10-30 ENCOUNTER — Ambulatory Visit (HOSPITAL_COMMUNITY): Payer: PPO | Admitting: Certified Registered Nurse Anesthetist

## 2016-10-30 ENCOUNTER — Ambulatory Visit (HOSPITAL_BASED_OUTPATIENT_CLINIC_OR_DEPARTMENT_OTHER): Payer: PPO

## 2016-10-30 ENCOUNTER — Ambulatory Visit (HOSPITAL_COMMUNITY)
Admission: RE | Admit: 2016-10-30 | Discharge: 2016-10-30 | Disposition: A | Discharge status: Home / Self Care | Payer: PPO | Source: Ambulatory Visit | Attending: Cardiology | Admitting: Cardiology

## 2016-10-30 ENCOUNTER — Ambulatory Visit (INDEPENDENT_AMBULATORY_CARE_PROVIDER_SITE_OTHER): Payer: PPO | Admitting: *Deleted

## 2016-10-30 DIAGNOSIS — I340 Nonrheumatic mitral (valve) insufficiency: Secondary | ICD-10-CM | POA: Diagnosis not present

## 2016-10-30 DIAGNOSIS — I480 Paroxysmal atrial fibrillation: Secondary | ICD-10-CM

## 2016-10-30 DIAGNOSIS — Z955 Presence of coronary angioplasty implant and graft: Secondary | ICD-10-CM | POA: Insufficient documentation

## 2016-10-30 DIAGNOSIS — Z7901 Long term (current) use of anticoagulants: Secondary | ICD-10-CM | POA: Diagnosis not present

## 2016-10-30 DIAGNOSIS — I252 Old myocardial infarction: Secondary | ICD-10-CM | POA: Insufficient documentation

## 2016-10-30 DIAGNOSIS — Z5181 Encounter for therapeutic drug level monitoring: Secondary | ICD-10-CM | POA: Diagnosis not present

## 2016-10-30 DIAGNOSIS — I080 Rheumatic disorders of both mitral and aortic valves: Secondary | ICD-10-CM | POA: Diagnosis not present

## 2016-10-30 DIAGNOSIS — I10 Essential (primary) hypertension: Secondary | ICD-10-CM | POA: Insufficient documentation

## 2016-10-30 DIAGNOSIS — E785 Hyperlipidemia, unspecified: Secondary | ICD-10-CM | POA: Insufficient documentation

## 2016-10-30 DIAGNOSIS — Z79899 Other long term (current) drug therapy: Secondary | ICD-10-CM | POA: Diagnosis not present

## 2016-10-30 DIAGNOSIS — E669 Obesity, unspecified: Secondary | ICD-10-CM | POA: Diagnosis not present

## 2016-10-30 DIAGNOSIS — I2510 Atherosclerotic heart disease of native coronary artery without angina pectoris: Secondary | ICD-10-CM | POA: Diagnosis not present

## 2016-10-30 DIAGNOSIS — K219 Gastro-esophageal reflux disease without esophagitis: Secondary | ICD-10-CM | POA: Insufficient documentation

## 2016-10-30 DIAGNOSIS — I481 Persistent atrial fibrillation: Secondary | ICD-10-CM | POA: Diagnosis not present

## 2016-10-30 DIAGNOSIS — G4733 Obstructive sleep apnea (adult) (pediatric): Secondary | ICD-10-CM | POA: Diagnosis not present

## 2016-10-30 DIAGNOSIS — I4819 Other persistent atrial fibrillation: Secondary | ICD-10-CM

## 2016-10-30 DIAGNOSIS — Z87891 Personal history of nicotine dependence: Secondary | ICD-10-CM | POA: Diagnosis not present

## 2016-10-30 DIAGNOSIS — Z888 Allergy status to other drugs, medicaments and biological substances status: Secondary | ICD-10-CM | POA: Diagnosis not present

## 2016-10-30 DIAGNOSIS — I4891 Unspecified atrial fibrillation: Secondary | ICD-10-CM

## 2016-10-30 HISTORY — PX: TEE WITHOUT CARDIOVERSION: SHX5443

## 2016-10-30 HISTORY — PX: CARDIOVERSION: SHX1299

## 2016-10-30 LAB — POCT INR: INR: 3.1

## 2016-10-30 SURGERY — ECHOCARDIOGRAM, TRANSESOPHAGEAL
Anesthesia: General

## 2016-10-30 MED ORDER — BUTAMBEN-TETRACAINE-BENZOCAINE 2-2-14 % EX AERO
INHALATION_SPRAY | CUTANEOUS | Status: DC | PRN
  Administered 2016-10-30: 2 via TOPICAL

## 2016-10-30 MED ORDER — PROPOFOL 10 MG/ML IV BOLUS
INTRAVENOUS | Status: DC | PRN
  Administered 2016-10-30: 10 mg via INTRAVENOUS
  Administered 2016-10-30: 20 mg via INTRAVENOUS
  Administered 2016-10-30 (×2): 10 mg via INTRAVENOUS

## 2016-10-30 MED ORDER — PROPOFOL 500 MG/50ML IV EMUL
INTRAVENOUS | Status: DC | PRN
  Administered 2016-10-30: 100 ug/kg/min via INTRAVENOUS

## 2016-10-30 MED ORDER — LACTATED RINGERS IV SOLN
INTRAVENOUS | Status: DC | PRN
  Administered 2016-10-30: 14:00:00 via INTRAVENOUS

## 2016-10-30 MED ORDER — SODIUM CHLORIDE 0.9 % IV SOLN: INTRAVENOUS | Status: DC

## 2016-10-30 NOTE — Progress Notes (Signed)
  Echocardiogram Echocardiogram Transesophageal has been performed.  Johny Chess 10/30/2016, 2:42 PM

## 2016-10-30 NOTE — Anesthesia Preprocedure Evaluation (Addendum)
Anesthesia Evaluation  Patient identified by MRN, date of birth, ID band Patient awake    Reviewed: Allergy & Precautions, NPO status , Patient's Chart, lab work & pertinent test results, reviewed documented beta blocker date and time   Airway Mallampati: II  TM Distance: >3 FB Neck ROM: Full    Dental  (+) Dental Advisory Given, Teeth Intact   Pulmonary sleep apnea and Continuous Positive Airway Pressure Ventilation , former smoker,    Pulmonary exam normal        Cardiovascular Exercise Tolerance: Good hypertension, + CAD and + Cardiac Stents  + dysrhythmias Atrial Fibrillation + Valvular Problems/Murmurs AS  Rhythm:Irregular Rate:Normal  TTE 09/2016 - Severe LVH. The estimated ejection fraction was 55%. - Aortic valve: Mild AS gradients stable since September 2017. Bicuspid AV with fused right and left cusp. Valve area (VTI): 2.92 cm^2. Valve area (Vmax): 2.71 cm^2. Valve area (Vmean): 2.56 cm^2. - Left atrium: The atrium was moderately dilated. - Right ventricle: The cavity size was mildly dilated.  EKG - Afib w/ LAFB   Neuro/Psych negative neurological ROS  negative psych ROS   GI/Hepatic Neg liver ROS, GERD  Medicated and Controlled,  Endo/Other  negative endocrine ROS  Renal/GU negative Renal ROS     Musculoskeletal  (+) Arthritis ,   Abdominal   Peds  Hematology negative hematology ROS (+)   Anesthesia Other Findings Prostate ca  Reproductive/Obstetrics                           Anesthesia Physical  Anesthesia Plan  ASA: III  Anesthesia Plan: MAC   Post-op Pain Management:    Induction: Intravenous  PONV Risk Score and Plan: Treatment may vary due to age or medical condition and Propofol infusion  Airway Management Planned: Nasal Cannula  Additional Equipment: None  Intra-op Plan:   Post-operative Plan:   Informed Consent: I have reviewed the patients History and  Physical, chart, labs and discussed the procedure including the risks, benefits and alternatives for the proposed anesthesia with the patient or authorized representative who has indicated his/her understanding and acceptance.     Plan Discussed with: CRNA  Anesthesia Plan Comments:      Anesthesia Quick Evaluation

## 2016-10-30 NOTE — H&P (View-Only) (Signed)
Primary Care Physician: Lavone Orn, MD Referring Physician: [name] is a 83 y.o. male with a h/o CAD, bicuspid aortic valve with mild aortic stenosis, paroxysmal afib that presented for an echo and found to be in afib.In the afib clinic today, he states that he has been in afib for about one week. It followed a gambling weekend in [place], where he may have had slighly more alcohol than usual. Usually drinks one glass of wine a night. He had a successful cardioversion in February and has not has any issues until now. He is on warfarin for a chadsvasc score of at least 4.   Today, he denies symptoms of palpitations, chest pain, shortness of breath, orthopnea, PND, lower extremity edema, dizziness, presyncope, syncope, or neurologic sequela. The patient is tolerating medications without difficulties and is otherwise without complaint today.   Past Medical History:  Diagnosis Date  . Aortic stenosis    mild by echo 09/2016  . Coronary disease    2 Vessel,inferior wall MI june 2003, PTCA and Stent RCA June 2003, PTCA stent, Left Circumflex OM 02 Mar 2003, Dr Radford Pax  . Dilated aortic root ([place])    84mm by echo 09/2016  . DJD (degenerative joint disease) of knee    LEFT  . Dyslipidemia   . Dysrhythmia    afib  . GERD (gastroesophageal reflux disease)   . History of echocardiogram 08/2010   Bicuspid aortic valve w moderate aortic valve sclerosis,mild AS and mildly dilated aorta   . History of rotator cuff syndrome    (Right) with biceps tendinitis  . Hypertension   . Obesity   . OSA (obstructive sleep apnea)    presented with HA and drowsiness,mild sleep apnea with oxygen desats to 78%  . Prostate cancer ([place])    /BPH, radioactive seed 2007, Nesi    Past Surgical History:  Procedure Laterality Date  . CARDIAC CATHETERIZATION    . CARDIOVERSION N/A 08/27/2013   Procedure: CARDIOVERSION;  Surgeon: Sueanne Margarita, MD;  Location[facility] ENDOSCOPY;  Service:  Cardiovascular;  Laterality: N/A;  . CARDIOVERSION N/A 03/15/2016   Procedure: CARDIOVERSION;  Surgeon: Dorothy Spark, MD;  Location: [place];  Service: Cardiovascular;  Laterality: N/A;  . CORONARY ANGIOPLASTY    . JOINT REPLACEMENT     left knee replacement 2009    Current Outpatient Prescriptions  Medication Sig Dispense Refill  . diphenhydrAMINE (BENADRYL) 25 MG tablet Take 25 mg by mouth at bedtime as needed for sleep.     Marland Kitchen ezetimibe (ZETIA) 10 MG tablet Take 1 tablet (10 mg total) by mouth daily. 90 tablet 2  . loratadine (CLARITIN) 10 MG tablet Take 10 mg by mouth daily as needed for allergies.    . metoprolol tartrate (LOPRESSOR) 25 MG tablet Take 0.5 tablets (12.5 mg total) by mouth 2 (two) times daily. 180 tablet 3  . nitroGLYCERIN (NITROSTAT) 0.4 MG SL tablet Place 1 tablet (0.4 mg total) under the tongue every 5 (five) minutes as needed for chest pain. 25 tablet 1  . Omega-3 Fatty Acids (FISH OIL) 1000 MG CAPS Take 2,000 mg by mouth daily.    Marland Kitchen omeprazole (PRILOSEC) 20 MG capsule Take 20 mg by mouth every other day.     . rosuvastatin (CRESTOR) 5 MG tablet Take 1 tablet (5 mg total) by mouth daily. 90 tablet 2  . tamsulosin (FLOMAX) 0.4 MG CAPS capsule Take 0.4 mg by mouth daily.    Marland Kitchen warfarin (  COUMADIN) 5 MG tablet TAKE BY MOUTH AS DIRECTED BY COUMADIN CLINIC 90 tablet 0  . zolpidem (AMBIEN) 5 MG tablet Take 2.5-5 mg by mouth at bedtime as needed for sleep.      No current facility-administered medications for this encounter.    Facility-Administered Medications Ordered in Other Encounters  Medication Dose Route Frequency Provider Last Rate Last Dose  . 0.9 %  sodium chloride infusion    Continuous PRN Lowella Dell, CRNA        Allergies  Allergen Reactions  . Prednisone Other (See Comments)    REACTION: anxiety reaction  . Zocor [Simvastatin] Other (See Comments)    REACTION: Muscle aches  . Oxycodone Hcl Rash    Social History   Social History  .  Marital status: Married    Spouse name: N/A  . Number of children: N/A  . Years of education: N/A   Occupational History  . Not on file.   Social History Main Topics  . Smoking status: Former Smoker    Quit date: 01/28/1958  . Smokeless tobacco: Never Used  . Alcohol use Yes     Comment: "a few a week"  . Drug use: No  . Sexual activity: Not on file   Other Topics Concern  . Not on file   Social History Narrative  . No narrative on file    Family History  Problem Relation Age of Onset  . Diabetes Mother   . Peripheral vascular disease Father   . Heart attack Father   . Coronary artery disease Brother   . Liver cancer Brother   . Coronary artery disease Brother   . Heart attack Brother   . Cancer Brother   . Sudden death Brother   . Fainting Brother     ROS- All systems are reviewed and negative except as per the HPI above  Physical Exam: Vitals:   10/14/16 1403  BP: 112/68  Pulse: 74  Weight: 232 lb 9.6 oz (105.5 kg)  Height: 6\' 1"  (1.854 m)   Wt Readings from Last 3 Encounters:  10/14/16 232 lb 9.6 oz (105.5 kg)  03/25/16 224 lb 12.8 oz (102 kg)  03/13/16 218 lb 12.8 oz (99.2 kg)    Labs: Lab Results  Component Value Date   NA 140 10/14/2016   K 3.9 10/14/2016   CL 107 10/14/2016   CO2 28 10/14/2016   GLUCOSE 153 (H) 10/14/2016   BUN 24 (H) 10/14/2016   CREATININE 1.27 (H) 10/14/2016   CALCIUM 9.0 10/14/2016   MG 1.8 03/13/2016   Lab Results  Component Value Date   INR 2.01 10/14/2016   Lab Results  Component Value Date   CHOL 95 (L) 03/13/2016   HDL 41 03/13/2016   LDLCALC 36 03/13/2016   TRIG 89 03/13/2016     GEN- The patient is well appearing, alert and oriented x 3 today.   Head- normocephalic, atraumatic Eyes-  Sclera clear, conjunctiva pink Ears- hearing intact Oropharynx- clear Neck- supple, no JVP Lymph- no cervical lymphadenopathy Lungs- Clear to ausculation bilaterally, normal work of breathing Heart- Regular rate and  rhythm, no murmurs, rubs or gallops, PMI not laterally displaced GI- soft, NT, ND, + BS Extremities- no clubbing, cyanosis, or edema MS- no significant deformity or atrophy Skin- no rash or lesion Psych- euthymic mood, full affect Neuro- strength and sensation are intact  EKG-afib at 74 bpm Echo-Study Conclusions  - Left ventricle: Wall thickness was increased in a pattern of  severe LVH. The estimated ejection fraction was 55%. - Aortic valve: MIld AS gradients stable since September 2017.   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 Valve area (VTI):   2.92 cm^2. Valve area (Vmax): 2.71 cm^2. Valve area (Vmean): 2.56   cm^2. - Left atrium: The atrium was moderately dilated. - Right ventricle: The cavity size was mildly dilated. - Atrial septum: No defect or patent foramen ovale was identified.    Assessment and Plan: 1. Paroxysmal afib Pt given options of restoring SR  He has never been on antiarrythmic therapy and would not be a candidate for flecainide for structural heart changes and CAD. Tikosyn and  sotalol were discussed in detail RIght now he is not willing to commit to either one He would like to try another cardioversion with lifestyle issues as losing weight and cutting back on alcohol He is on warfarin and does not want to wait for the therapeutic 4 week INR's Will schedule  for TEE guided cardioversion this Friday CBC/Bmet,tsh,INR today Will have INR at Coumadin clinc am of procedure   F/u with Dr. Radford Pax 9/25 as already scheduled  Butch Penny C. Carroll, [facility] [address] [place],  [address] [phone]

## 2016-10-30 NOTE — CV Procedure (Signed)
    Electrical Cardioversion Procedure Note Randall Chen [id number] December 06, 1932  Procedure: Electrical Cardioversion Indications:  Atrial Fibrillation  Time Out: Verified patient identification, verified procedure,medications/allergies/relevent history reviewed, required imaging and test results available.  Performed  Procedure Details  The patient was NPO after midnight. Anesthesia was administered at the beside  with propofol.  Cardioversion was performed with synchronized biphasic defibrillation via AP pads with 120 joules.  1 attempt(s) were performed.  The patient converted to normal sinus rhythm. The patient tolerated the procedure well   IMPRESSION:  Successful cardioversion of atrial fibrillation  TEE - normal EF, mildly calcified aortic valve with trivial AR.     Randall Chen 10/30/2016, 2:21 PM

## 2016-10-30 NOTE — Anesthesia Postprocedure Evaluation (Signed)
Anesthesia Post Note  Patient: Randall Chen  Procedure(s) Performed: TRANSESOPHAGEAL ECHOCARDIOGRAM (TEE) (N/A ) CARDIOVERSION (N/A )     Patient location during evaluation: PACU Anesthesia Type: General Level of consciousness: awake and alert Pain management: pain level controlled Vital Signs Assessment: post-procedure vital signs reviewed and stable Respiratory status: spontaneous breathing, nonlabored ventilation and respiratory function stable Cardiovascular status: stable and blood pressure returned to baseline Anesthetic complications: no    Last Vitals:  Vitals:   10/30/16 1430 10/30/16 1435  BP:  103/60  Pulse: (!) 52 (!) 49  Resp: 13 11  Temp:    SpO2: 96% 97%    Last Pain:  Vitals:   10/30/16 1427  TempSrc: Oral                 Audry Pili

## 2016-10-30 NOTE — Interval H&P Note (Signed)
History and Physical Interval Note:  10/30/2016 1:45 PM  Randall Chen  has presented today for surgery, with the diagnosis of AFIB  The various methods of treatment have been discussed with the patient and family. After consideration of risks, benefits and other options for treatment, the patient has consented to  Procedure(s): TRANSESOPHAGEAL ECHOCARDIOGRAM (TEE) (N/A) CARDIOVERSION (N/A) as a surgical intervention .  The patient's history has been reviewed, patient examined, no change in status, stable for surgery.  I have reviewed the patient's chart and labs.  Questions were answered to the patient's satisfaction.     [employer]

## 2016-10-30 NOTE — Discharge Instructions (Signed)
TEE ° °YOU HAD AN CARDIAC PROCEDURE TODAY: Refer to the procedure report and other information in the discharge instructions given to you for any specific questions about what was found during the examination. If this information does not answer your questions, please call [facility] office at [phone] to clarify.  ° °DIET: Your first meal following the procedure should be a light meal and then it is ok to progress to your normal diet. A half-sandwich or bowl of soup is an example of a good first meal. Heavy or fried foods are harder to digest and may make you feel nauseous or bloated. Drink plenty of fluids but you should avoid alcoholic beverages for 24 hours. If you had a esophageal dilation, please see attached instructions for diet.  ° °ACTIVITY: Your care partner should take you home directly after the procedure. You should plan to take it easy, moving slowly for the rest of the day. You can resume normal activity the day after the procedure however YOU SHOULD NOT DRIVE, use power tools, machinery or perform tasks that involve climbing or major physical exertion for 24 hours (because of the sedation medicines used during the test).  ° °SYMPTOMS TO REPORT IMMEDIATELY: °A cardiologist can be reached at any hour. Please call [phone] for any of the following symptoms:  °Vomiting of blood or coffee ground material  °New, significant abdominal pain  °New, significant chest pain or pain under the shoulder blades  °Painful or persistently difficult swallowing  °New shortness of breath  °Black, tarry-looking or red, bloody stools ° °FOLLOW UP:  °Please also call with any specific questions about appointments or follow up tests. ° °Electrical Cardioversion, Care After °This sheet gives you information about how to care for yourself after your procedure. Your health care provider may also give you more specific instructions. If you have problems or questions, contact your health care provider. °What can I  expect after the procedure? °After the procedure, it is common to have: °· Some redness on the skin where the shocks were given. ° °Follow these instructions at home: °· Do not drive for 24 hours if you were given a medicine to help you relax (sedative). °· Take over-the-counter and prescription medicines only as told by your health care provider. °· Ask your health care provider how to check your pulse. Check it often. °· Rest for 48 hours after the procedure or as told by your health care provider. °· Avoid or limit your caffeine use as told by your health care provider. °Contact a health care provider if: °· You feel like your heart is beating too quickly or your pulse is not regular. °· You have a serious muscle cramp that does not go away. °Get help right away if: °· You have discomfort in your chest. °· You are dizzy or you feel faint. °· You have trouble breathing or you are short of breath. °· Your speech is slurred. °· You have trouble moving an arm or leg on one side of your body. °· Your fingers or toes turn cold or blue. °This information is not intended to replace advice given to you by your health care provider. Make sure you discuss any questions you have with your health care provider. °Document Released: 11/04/2012 Document Revised: 08/18/2015 Document Reviewed: 07/21/2015 °Elsevier Interactive Patient Education © 2018 [employer] ° °

## 2016-10-30 NOTE — Transfer of Care (Addendum)
Immediate Anesthesia Transfer of Care Note  Patient: Randall Chen  Procedure(s) Performed: TRANSESOPHAGEAL ECHOCARDIOGRAM (TEE) (N/A ) CARDIOVERSION (N/A )  Patient Location: Endoscopy Unit  Anesthesia Type:MAC  Level of Consciousness: drowsy and patient cooperative  Airway & Oxygen Therapy: Patient Spontanous Breathing and Patient connected to nasal cannula oxygen  Post-op Assessment: Report given to RN, Post -op Vital signs reviewed and stable and Patient moving all extremities  Post vital signs: Reviewed and stable  Last Vitals:  Vitals:   10/30/16 1233  BP: 120/78  Pulse: (!) 57  Resp: 10  Temp: 36.5 C  SpO2: 97%    Last Pain:  Vitals:   10/30/16 1233  TempSrc: Oral         Complications: No apparent anesthesia complications

## 2016-10-31 ENCOUNTER — Encounter (HOSPITAL_COMMUNITY): Payer: Self-pay | Admitting: Cardiology

## 2016-10-31 NOTE — Addendum Note (Signed)
Addendum  created 10/31/16 1156 by Audry Pili, MD   Sign clinical note

## 2016-11-01 ENCOUNTER — Telehealth: Payer: Self-pay | Admitting: Cardiology

## 2016-11-01 NOTE — Telephone Encounter (Signed)
Called patient back to let him know he could come to his appt on Monday fasting since it is at 9:00 am, and if Dr. Radford Pax needs to order lab work that he could have it done after his appt. Patient verbalized understanding and thanked me for the call.

## 2016-11-01 NOTE — Telephone Encounter (Signed)
  New Prob   Pt states he typically does fasting lab during yearly visit with Dr. Radford Pax. No orders in EPIC.

## 2016-11-04 ENCOUNTER — Encounter: Payer: Self-pay | Admitting: Cardiology

## 2016-11-04 ENCOUNTER — Telehealth: Payer: Self-pay | Admitting: *Deleted

## 2016-11-04 ENCOUNTER — Ambulatory Visit (INDEPENDENT_AMBULATORY_CARE_PROVIDER_SITE_OTHER): Payer: PPO

## 2016-11-04 ENCOUNTER — Ambulatory Visit (INDEPENDENT_AMBULATORY_CARE_PROVIDER_SITE_OTHER): Payer: PPO | Admitting: Cardiology

## 2016-11-04 VITALS — BP 160/78 | HR 50 | Ht 73.0 in | Wt 231.8 lb

## 2016-11-04 DIAGNOSIS — Z79899 Other long term (current) drug therapy: Secondary | ICD-10-CM | POA: Diagnosis not present

## 2016-11-04 DIAGNOSIS — I350 Nonrheumatic aortic (valve) stenosis: Secondary | ICD-10-CM | POA: Diagnosis not present

## 2016-11-04 DIAGNOSIS — I481 Persistent atrial fibrillation: Secondary | ICD-10-CM

## 2016-11-04 DIAGNOSIS — I480 Paroxysmal atrial fibrillation: Secondary | ICD-10-CM | POA: Diagnosis not present

## 2016-11-04 DIAGNOSIS — G4733 Obstructive sleep apnea (adult) (pediatric): Secondary | ICD-10-CM | POA: Diagnosis not present

## 2016-11-04 DIAGNOSIS — Z5181 Encounter for therapeutic drug level monitoring: Secondary | ICD-10-CM | POA: Diagnosis not present

## 2016-11-04 DIAGNOSIS — I2510 Atherosclerotic heart disease of native coronary artery without angina pectoris: Secondary | ICD-10-CM

## 2016-11-04 DIAGNOSIS — I4819 Other persistent atrial fibrillation: Secondary | ICD-10-CM

## 2016-11-04 DIAGNOSIS — Z23 Encounter for immunization: Secondary | ICD-10-CM

## 2016-11-04 DIAGNOSIS — I10 Essential (primary) hypertension: Secondary | ICD-10-CM

## 2016-11-04 LAB — HEPATIC FUNCTION PANEL
ALT: 25 IU/L (ref 0–44)
AST: 32 IU/L (ref 0–40)
Albumin: 4.2 g/dL (ref 3.5–4.7)
Alkaline Phosphatase: 62 IU/L (ref 39–117)
Bilirubin Total: 0.5 mg/dL (ref 0.0–1.2)
Bilirubin, Direct: 0.17 mg/dL (ref 0.00–0.40)
Total Protein: 6.9 g/dL (ref 6.0–8.5)

## 2016-11-04 LAB — POCT INR: INR: 2.5

## 2016-11-04 LAB — LIPID PANEL
Chol/HDL Ratio: 2.2 ratio (ref 0.0–5.0)
Cholesterol, Total: 96 mg/dL — ABNORMAL LOW (ref 100–199)
HDL: 43 mg/dL (ref >39)
LDL Calculated: 35 mg/dL (ref 0–99)
Triglycerides: 88 mg/dL (ref 0–149)
VLDL Cholesterol Cal: 18 mg/dL (ref 5–40)

## 2016-11-04 NOTE — Telephone Encounter (Signed)
Patient states his insurance does not require a download.

## 2016-11-04 NOTE — Telephone Encounter (Signed)
-----   Message from Stanton Kidney, RN sent at 11/04/2016  9:20 AM EDT ----- Regarding: forgot chip Pt seen today in clinic w/ [facility]. Pt forgot their "chip"

## 2016-11-04 NOTE — Patient Instructions (Addendum)
Medication Instructions:  Your physician recommends that you continue on your current medications as directed. Please refer to the Current Medication list given to you today.  -- If you need a refill on your cardiac medications before your next appointment, please call your pharmacy. --  Labwork: Today: Lipid profile & Liver function test  Testing/Procedures: None ordered  Follow-Up: Your physician wants you to follow-up in: 6 months with Dr. Radford Pax.  You will receive a reminder letter in the mail two months in advance. If you don't receive a letter, please call our office to schedule the follow-up appointment.  Thank you for choosing [facility]!!    Any Other Special Instructions Will Be Listed Below (If Applicable).

## 2016-11-04 NOTE — Progress Notes (Signed)
Cardiology Office Note:    Date:  11/09/2016   ID:  Randall Chen, DOB [birth year], MRN [id number]  PCP:  Lavone Orn, MD  Cardiologist:  Fransico Him, MD   Referring MD: Lavone Orn, MD   Chief Complaint  Patient presents with  . Coronary Artery Disease  . Aortic Stenosis  . Hypertension  . Hyperlipidemia  . Sleep Apnea    History of Present Illness:    Randall Chen is a 83 y.o. male with a hx of ASCAD s/p inferior MI 2003 with PCI of RCA 6/03 and then PCI of left circ/OM 2/05, dyslipidemia, bicuspid AV with mild AS, mildly dilated aorta, PAF, OSA on CPAP and HTN.  He had a reoccurrence of PAF in Feb 2018 and underwent successful DCCV to NSR.  He was seen in the afib clinic 10/14/2016 with complaints of palpitations for 1 week after a gambling weekend in [place] where he drank more ETOH than usual.  Due to subtherapeutic INR, he underwent TEE/DCCV to NSR on 10/30/2016.  He is here today for followup post cardioversion. He is doing well.  He denies any chest pain or pressure, SOB, DOE, PND, orthopnea, LE edema, dizziness, palpitations or syncope. He is compliant with his meds and is tolerating meds with no SE.    Past Medical History:  Diagnosis Date  . Aortic stenosis    mild by echo 09/2016  . Coronary disease    2 Vessel,inferior wall MI june 2003, PTCA and Stent RCA June 2003, PTCA stent, Left Circumflex OM 02 Mar 2003, Dr Radford Pax  . Dilated aortic root ([place])    70mm by echo 09/2016  . DJD (degenerative joint disease) of knee    LEFT  . Dyslipidemia   . Dysrhythmia    afib  . GERD (gastroesophageal reflux disease)   . History of echocardiogram 08/2010   Bicuspid aortic valve w moderate aortic valve sclerosis,mild AS and mildly dilated aorta   . History of rotator cuff syndrome    (Right) with biceps tendinitis  . Hypertension   . Obesity   . OSA (obstructive sleep apnea)    presented with HA and drowsiness,mild sleep apnea with oxygen desats to 78%  . Prostate  cancer ([place])    /BPH, radioactive seed 2007, Nesi     Past Surgical History:  Procedure Laterality Date  . CARDIAC CATHETERIZATION    . CARDIOVERSION N/A 08/27/2013   Procedure: CARDIOVERSION;  Surgeon: Sueanne Margarita, MD;  Location: [facility] ENDOSCOPY;  Service: Cardiovascular;  Laterality: N/A;  . CARDIOVERSION N/A 03/15/2016   Procedure: CARDIOVERSION;  Surgeon: Dorothy Spark, MD;  Location: [place];  Service: Cardiovascular;  Laterality: N/A;  . CARDIOVERSION N/A 10/30/2016   Procedure: CARDIOVERSION;  Surgeon: Jerline Pain, MD;  Location[facility] ENDOSCOPY;  Service: Cardiovascular;  Laterality: N/A;  . CORONARY ANGIOPLASTY    . JOINT REPLACEMENT     left knee replacement 2009  . TEE WITHOUT CARDIOVERSION N/A 10/30/2016   Procedure: TRANSESOPHAGEAL ECHOCARDIOGRAM (TEE);  Surgeon: Jerline Pain, MD;  Location: [facility] ENDOSCOPY;  Service: Cardiovascular;  Laterality: N/A;    Current Medications: Current Meds  Medication Sig  . diphenhydrAMINE (BENADRYL) 25 MG tablet Take 25 mg by mouth at bedtime as needed for sleep.   Marland Kitchen ezetimibe (ZETIA) 10 MG tablet Take 1 tablet (10 mg total) by mouth daily.  Marland Kitchen loratadine (CLARITIN) 10 MG tablet Take 10 mg by mouth daily as needed for allergies.  . metoprolol tartrate (LOPRESSOR) 25 MG  tablet Take 0.5 tablets (12.5 mg total) by mouth 2 (two) times daily.  . nitroGLYCERIN (NITROSTAT) 0.4 MG SL tablet Place 1 tablet (0.4 mg total) under the tongue every 5 (five) minutes as needed for chest pain.  . Omega-3 Fatty Acids (FISH OIL) 1000 MG CAPS Take 2,000 mg by mouth daily.  Marland Kitchen omeprazole (PRILOSEC) 20 MG capsule Take 20 mg by mouth every other day.   . rosuvastatin (CRESTOR) 5 MG tablet Take 1 tablet (5 mg total) by mouth daily.  . tamsulosin (FLOMAX) 0.4 MG CAPS capsule Take 0.4 mg by mouth daily.  Marland Kitchen warfarin (COUMADIN) 5 MG tablet TAKE BY MOUTH AS DIRECTED BY COUMADIN CLINIC  . zolpidem (AMBIEN) 5 MG tablet Take 2.5-5 mg by mouth at bedtime as needed for  sleep.      Allergies:   Prednisone; Zocor [simvastatin]; and Oxycodone hcl   Social History   Social History  . Marital status: Married    Spouse name: N/A  . Number of children: N/A  . Years of education: N/A   Social History Main Topics  . Smoking status: Former Smoker    Quit date: 01/28/1958  . Smokeless tobacco: Never Used  . Alcohol use Yes     Comment: "a few a week"  . Drug use: No  . Sexual activity: Not Asked   Other Topics Concern  . None   Social History Narrative  . None     Family History: The patient's family history includes Cancer in his brother; Coronary artery disease in his brother and brother; Diabetes in his mother; Fainting in his brother; Heart attack in his brother and father; Liver cancer in his brother; Peripheral vascular disease in his father; Sudden death in his brother.  ROS:   Please see the history of present illness.    ROS  All other systems reviewed and negative.   EKGs/Labs/Other Studies Reviewed:    The following studies were reviewed today: Hospital notes and DCCV  EKG:  EKG is not ordered today.  Recent Labs: 03/13/2016: Magnesium 1.8 10/14/2016: TSH 2.193 10/25/2016: BUN 29; Creatinine, Ser 1.25; Hemoglobin 13.6; Platelets 155; Potassium 4.5; Sodium 143 11/04/2016: ALT 25   Recent Lipid Panel    Component Value Date/Time   CHOL 96 (L) 11/04/2016 0949   CHOL 101 06/17/2013 0821   TRIG 88 11/04/2016 0949   TRIG 75 06/17/2013 0821   HDL 43 11/04/2016 0949   HDL 42 06/17/2013 0821   CHOLHDL 2.2 11/04/2016 0949   CHOLHDL 2.7 09/18/2015 0902   VLDL 30 09/18/2015 0902   LDLCALC 35 11/04/2016 0949   LDLCALC 44 06/17/2013 0821    Physical Exam:    VS:  BP (!) 160/78   Pulse (!) 50   Ht 6\' 1"  (1.854 m)   Wt 231 lb 12.8 oz (105.1 kg)   SpO2 94%   BMI 30.58 kg/m     Wt Readings from Last 3 Encounters:  11/04/16 231 lb 12.8 oz (105.1 kg)  10/30/16 229 lb (103.9 kg)  10/14/16 232 lb 9.6 oz (105.5 kg)     GEN:   Well nourished, well developed in no acute distress HEENT: Normal NECK: No JVD; No carotid bruits LYMPHATICS: No lymphadenopathy CARDIAC: RRR, no murmurs, rubs, gallops RESPIRATORY:  Clear to auscultation without rales, wheezing or rhonchi  ABDOMEN: Soft, non-tender, non-distended MUSCULOSKELETAL:  No edema; No deformity  SKIN: Warm and dry NEUROLOGIC:  Alert and oriented x 3 PSYCHIATRIC:  Normal affect   ASSESSMENT:  1. Atherosclerosis of native coronary artery of native heart without angina pectoris   2. Essential hypertension, benign   3. Aortic valve stenosis, etiology of cardiac valve disease unspecified   4. OSA (obstructive sleep apnea)   5. Persistent atrial fibrillation ([place])   6. Medication management   7. Encounter for immunization    PLAN:    In order of problems listed above:  1.  ASCAD - s/p inferior MI 2003 with PCI of RCA 6/03 and then PCI of left circ/OM 2/05.  He denies any anginal symptoms.  He will continue on BB and statin.  No ASA due to DOAC.  2.  HTN - BP is well controlled on exam today.  He will continue on  Metoprolol 12.5mg  BID.  3.  Bicuspid AV with mild AS by recent echo - he is asymptomatic.    4.  OSA - the patient is tolerating PAP therapy well without any problems.  The patient has been using and benefiting from CPAP use and will continue to benefit from therapy. I will get a download from his DME.  5.  Persistent atrial fibrillation s/p DCCV 10/3 - he is maintaining NSR by exam today.  He will continue on BB and warfarin.  He is not a candidate for Class  IC agents due to CAD.  Other AAD therapy was discussed at afib clinic OV including Tikosyn and Sotolol but he was not willing to commit at that time.  He he has another breakthrough we will need to consider addition of AAD.  I encouraged him to try to limit his ETOH intake to minimal and be compliant with his CPAP.  His INRs are followed in our coumadin clinic.      Medication  Adjustments/Labs and Tests Ordered: Current medicines are reviewed at length with the patient today.  Concerns regarding medicines are outlined above.  Orders Placed This Encounter  Procedures  . Flu vaccine HIGH DOSE PF  . Lipid Profile  . Hepatic function panel   No orders of the defined types were placed in this encounter.   Signed, Fransico Him, MD  11/09/2016 3:35 PM    [place]

## 2016-11-08 ENCOUNTER — Telehealth: Payer: Self-pay | Admitting: *Deleted

## 2016-11-08 NOTE — Telephone Encounter (Signed)
Informed patient of compliance results and verbalized understanding was indicated. Patient understands his apnea events are in normal range at 3.2. Patient understands his compliance is good and his settings will not change. Patient states he did not wear his CPAP last night because of the power outage but he will resume once things are back to normal.

## 2016-11-08 NOTE — Telephone Encounter (Signed)
-----   Message from Sueanne Margarita, MD sent at 11/08/2016 11:46 AM EDT ----- Good AHI and compliance.  Continue current CPAP settings.

## 2016-12-06 ENCOUNTER — Other Ambulatory Visit: Payer: Self-pay | Admitting: *Deleted

## 2016-12-06 MED ORDER — ROSUVASTATIN CALCIUM 5 MG PO TABS: 5.0000 mg | ORAL_TABLET | Freq: Every day | ORAL | 3 refills | Status: DC

## 2016-12-10 ENCOUNTER — Ambulatory Visit (INDEPENDENT_AMBULATORY_CARE_PROVIDER_SITE_OTHER): Payer: PPO | Admitting: *Deleted

## 2016-12-10 DIAGNOSIS — I481 Persistent atrial fibrillation: Secondary | ICD-10-CM | POA: Diagnosis not present

## 2016-12-10 DIAGNOSIS — Z5181 Encounter for therapeutic drug level monitoring: Secondary | ICD-10-CM | POA: Diagnosis not present

## 2016-12-10 DIAGNOSIS — I480 Paroxysmal atrial fibrillation: Secondary | ICD-10-CM

## 2016-12-10 DIAGNOSIS — I4819 Other persistent atrial fibrillation: Secondary | ICD-10-CM

## 2016-12-10 LAB — POCT INR: INR: 2.2

## 2016-12-10 NOTE — Patient Instructions (Signed)
Continue on same dosage 1/2 tablet daily except 1 tablet on Mondays, Wednesdays and Fridays. Recheck INR in 4 weeks.  but pt states will come in  Dec 18th

## 2016-12-17 ENCOUNTER — Other Ambulatory Visit: Payer: Self-pay | Admitting: *Deleted

## 2016-12-17 MED ORDER — NITROGLYCERIN 0.4 MG SL SUBL: 0.4000 mg | SUBLINGUAL_TABLET | SUBLINGUAL | 5 refills | Status: DC | PRN

## 2016-12-27 ENCOUNTER — Other Ambulatory Visit: Payer: Self-pay | Admitting: Cardiology

## 2017-01-14 ENCOUNTER — Ambulatory Visit (INDEPENDENT_AMBULATORY_CARE_PROVIDER_SITE_OTHER): Payer: PPO | Admitting: *Deleted

## 2017-01-14 DIAGNOSIS — I480 Paroxysmal atrial fibrillation: Secondary | ICD-10-CM

## 2017-01-14 DIAGNOSIS — I4819 Other persistent atrial fibrillation: Secondary | ICD-10-CM

## 2017-01-14 DIAGNOSIS — Z5181 Encounter for therapeutic drug level monitoring: Secondary | ICD-10-CM | POA: Diagnosis not present

## 2017-01-14 DIAGNOSIS — I481 Persistent atrial fibrillation: Secondary | ICD-10-CM

## 2017-01-14 LAB — POCT INR: INR: 2.8

## 2017-01-14 NOTE — Patient Instructions (Signed)
Description   Continue on same dosage 1/2 tablet daily except 1 tablet on Mondays, Wednesdays and Fridays. Recheck INR in 6 weeks.

## 2017-01-16 DIAGNOSIS — I2510 Atherosclerotic heart disease of native coronary artery without angina pectoris: Secondary | ICD-10-CM | POA: Diagnosis not present

## 2017-01-16 DIAGNOSIS — M159 Polyosteoarthritis, unspecified: Secondary | ICD-10-CM | POA: Diagnosis not present

## 2017-01-16 DIAGNOSIS — I10 Essential (primary) hypertension: Secondary | ICD-10-CM | POA: Diagnosis not present

## 2017-01-16 DIAGNOSIS — I480 Paroxysmal atrial fibrillation: Secondary | ICD-10-CM | POA: Diagnosis not present

## 2017-01-16 DIAGNOSIS — N400 Enlarged prostate without lower urinary tract symptoms: Secondary | ICD-10-CM | POA: Diagnosis not present

## 2017-01-16 DIAGNOSIS — Z8546 Personal history of malignant neoplasm of prostate: Secondary | ICD-10-CM | POA: Diagnosis not present

## 2017-02-07 ENCOUNTER — Other Ambulatory Visit: Payer: Self-pay | Admitting: *Deleted

## 2017-02-07 MED ORDER — EZETIMIBE 10 MG PO TABS: 10.0000 mg | ORAL_TABLET | Freq: Every day | ORAL | 2 refills | Status: DC

## 2017-02-19 DIAGNOSIS — H35363 Drusen (degenerative) of macula, bilateral: Secondary | ICD-10-CM | POA: Diagnosis not present

## 2017-02-19 DIAGNOSIS — Z961 Presence of intraocular lens: Secondary | ICD-10-CM | POA: Diagnosis not present

## 2017-02-19 DIAGNOSIS — H33312 Horseshoe tear of retina without detachment, left eye: Secondary | ICD-10-CM | POA: Diagnosis not present

## 2017-02-19 DIAGNOSIS — H35371 Puckering of macula, right eye: Secondary | ICD-10-CM | POA: Diagnosis not present

## 2017-02-19 DIAGNOSIS — H26493 Other secondary cataract, bilateral: Secondary | ICD-10-CM | POA: Diagnosis not present

## 2017-02-21 ENCOUNTER — Other Ambulatory Visit: Payer: Self-pay | Admitting: Cardiology

## 2017-02-25 ENCOUNTER — Ambulatory Visit (INDEPENDENT_AMBULATORY_CARE_PROVIDER_SITE_OTHER): Payer: PPO | Admitting: *Deleted

## 2017-02-25 DIAGNOSIS — Z5181 Encounter for therapeutic drug level monitoring: Secondary | ICD-10-CM | POA: Diagnosis not present

## 2017-02-25 DIAGNOSIS — I481 Persistent atrial fibrillation: Secondary | ICD-10-CM

## 2017-02-25 DIAGNOSIS — I480 Paroxysmal atrial fibrillation: Secondary | ICD-10-CM | POA: Diagnosis not present

## 2017-02-25 DIAGNOSIS — I4819 Other persistent atrial fibrillation: Secondary | ICD-10-CM

## 2017-02-25 LAB — POCT INR: INR: 2.7

## 2017-02-25 NOTE — Patient Instructions (Signed)
Description   Continue on same dosage 1/2 tablet daily except 1 tablet on Mondays, Wednesdays and Fridays. Recheck INR in 6 weeks.

## 2017-02-28 DIAGNOSIS — K219 Gastro-esophageal reflux disease without esophagitis: Secondary | ICD-10-CM | POA: Diagnosis not present

## 2017-02-28 DIAGNOSIS — I480 Paroxysmal atrial fibrillation: Secondary | ICD-10-CM | POA: Diagnosis not present

## 2017-02-28 DIAGNOSIS — B356 Tinea cruris: Secondary | ICD-10-CM | POA: Diagnosis not present

## 2017-02-28 DIAGNOSIS — I2510 Atherosclerotic heart disease of native coronary artery without angina pectoris: Secondary | ICD-10-CM | POA: Diagnosis not present

## 2017-02-28 DIAGNOSIS — Z8546 Personal history of malignant neoplasm of prostate: Secondary | ICD-10-CM | POA: Diagnosis not present

## 2017-02-28 DIAGNOSIS — E669 Obesity, unspecified: Secondary | ICD-10-CM | POA: Diagnosis not present

## 2017-02-28 DIAGNOSIS — E7800 Pure hypercholesterolemia, unspecified: Secondary | ICD-10-CM | POA: Diagnosis not present

## 2017-02-28 DIAGNOSIS — Z23 Encounter for immunization: Secondary | ICD-10-CM | POA: Diagnosis not present

## 2017-02-28 DIAGNOSIS — Z1389 Encounter for screening for other disorder: Secondary | ICD-10-CM | POA: Diagnosis not present

## 2017-02-28 DIAGNOSIS — Z0000 Encounter for general adult medical examination without abnormal findings: Secondary | ICD-10-CM | POA: Diagnosis not present

## 2017-02-28 DIAGNOSIS — N400 Enlarged prostate without lower urinary tract symptoms: Secondary | ICD-10-CM | POA: Diagnosis not present

## 2017-02-28 DIAGNOSIS — G4733 Obstructive sleep apnea (adult) (pediatric): Secondary | ICD-10-CM | POA: Diagnosis not present

## 2017-02-28 DIAGNOSIS — I10 Essential (primary) hypertension: Secondary | ICD-10-CM | POA: Diagnosis not present

## 2017-03-01 DIAGNOSIS — S81011A Laceration without foreign body, right knee, initial encounter: Secondary | ICD-10-CM | POA: Diagnosis not present

## 2017-03-01 DIAGNOSIS — M25561 Pain in right knee: Secondary | ICD-10-CM | POA: Diagnosis not present

## 2017-03-06 DIAGNOSIS — H903 Sensorineural hearing loss, bilateral: Secondary | ICD-10-CM | POA: Diagnosis not present

## 2017-03-11 ENCOUNTER — Telehealth: Payer: Self-pay | Admitting: Cardiology

## 2017-03-11 NOTE — Telephone Encounter (Signed)
Spoke with patient. He stated he was out raking leaves yesterday and felt more winded and out of breath than usual. Patient feels like his is back in afib. HR sustaining 60-70s. Patient denies chest pain, sob, or syncope. Patient requesting to see a provider this week. Patient scheduled with [name], NP on 03/13/17. Informed patient I would forward to Dr. Radford Pax for further recommendations. Patient in agreement with plan and thankful for the call.

## 2017-03-11 NOTE — Telephone Encounter (Signed)
New message   Patient c/o Palpitations:  High priority if patient c/o lightheadedness, shortness of breath, or chest pain  1) How long have you had palpitations/irregular HR/ Afib? Are you having the symptoms now? Hr is between 60-70 usually low 50's  2) Are you currently experiencing lightheadedness, SOB or CP? He was SOB yesterday raking leaves and very fatigued  3) Do you have a history of afib (atrial fibrillation) or irregular heart rhythm?  yes  4) Have you checked your BP or HR? (document readings if available):  no  5) Are you experiencing any other symptoms? Fatigue - wanted an appt this week, put him with [name] on 2/14 11a

## 2017-03-12 DIAGNOSIS — H903 Sensorineural hearing loss, bilateral: Secondary | ICD-10-CM | POA: Diagnosis not present

## 2017-03-12 NOTE — Progress Notes (Signed)
Cardiology Office Note   Date:  03/13/2017   ID:  Randall Chen, DOB 08/10/1932, MRN [id number]  PCP:  Lavone Orn, MD  Cardiologist:  Dr. Radford Pax    Chief Complaint  Patient presents with  . Atrial Fibrillation      History of Present Illness: Randall Chen is a 84 y.o. male who presents for recurrent atrial fib.   He has a hx of ASCAD s/p inferior MI 2003 with PCI of RCA 6/03 and then PCI of left circ/OM 2/05, dyslipidemia, bicuspid AV with mild AS, mildly dilated aorta, PAF, OSA on CPAP and HTN.  He had a reoccurrence of PAF in Feb 2018 and underwent successful DCCV to NSR.  He was seen in the afib clinic 10/14/2016 with complaints of palpitations for 1 week after a gambling weekend in [place] where he drank more ETOH than usual.  Due to subtherapeutic INR, he underwent TEE/DCCV to NSR on 10/30/2016.   He called on the 12th of this week with SOB and thought he was back in a fib.  Today he is indeed back in a fib.  Rate is controlled.  He has no chest pain.  Only complaint is DOE.  He can do minor activity without problems.  Last INR is 2.7.  On last visit with Dr. Radford Pax she noted.   He is not a candidate for Class  IC agents due to CAD.  Other AAD therapy was discussed at afib clinic OV including Tikosyn and Sotolol but he was not willing to commit at that time.  He he has another breakthrough we will need to consider addition of AAD."   He is compliant with CPAP.    Past Medical History:  Diagnosis Date  . Aortic stenosis    mild by echo 09/2016  . Coronary disease    2 Vessel,inferior wall MI june 2003, PTCA and Stent RCA June 2003, PTCA stent, Left Circumflex OM 02 Mar 2003, Dr Radford Pax  . Dilated aortic root ([place])    25mm by echo 09/2016  . DJD (degenerative joint disease) of knee    LEFT  . Dyslipidemia   . Dysrhythmia    afib  . GERD (gastroesophageal reflux disease)   . History of echocardiogram 08/2010   Bicuspid aortic valve w moderate aortic valve sclerosis,mild AS  and mildly dilated aorta   . History of rotator cuff syndrome    (Right) with biceps tendinitis  . Hypertension   . Obesity   . OSA (obstructive sleep apnea)    presented with HA and drowsiness,mild sleep apnea with oxygen desats to 78%  . Prostate cancer ([place])    /BPH, radioactive seed 2007, Nesi     Past Surgical History:  Procedure Laterality Date  . CARDIAC CATHETERIZATION    . CARDIOVERSION N/A 08/27/2013   Procedure: CARDIOVERSION;  Surgeon: Sueanne Margarita, MD;  Location: [facility] ENDOSCOPY;  Service: Cardiovascular;  Laterality: N/A;  . CARDIOVERSION N/A 03/15/2016   Procedure: CARDIOVERSION;  Surgeon: Dorothy Spark, MD;  Location: [place];  Service: Cardiovascular;  Laterality: N/A;  . CARDIOVERSION N/A 10/30/2016   Procedure: CARDIOVERSION;  Surgeon: Jerline Pain, MD;  Location: [facility] ENDOSCOPY;  Service: Cardiovascular;  Laterality: N/A;  . CORONARY ANGIOPLASTY    . JOINT REPLACEMENT     left knee replacement 2009  . TEE WITHOUT CARDIOVERSION N/A 10/30/2016   Procedure: TRANSESOPHAGEAL ECHOCARDIOGRAM (TEE);  Surgeon: Jerline Pain, MD;  Location: [facility] ENDOSCOPY;  Service: Cardiovascular;  Laterality: N/A;  Current Outpatient Medications  Medication Sig Dispense Refill  . diphenhydrAMINE (BENADRYL) 25 MG tablet Take 25 mg by mouth at bedtime as needed for sleep.     Marland Kitchen ezetimibe (ZETIA) 10 MG tablet Take 1 tablet (10 mg total) by mouth daily. 90 tablet 2  . loratadine (CLARITIN) 10 MG tablet Take 10 mg by mouth daily as needed for allergies.    . metoprolol tartrate (LOPRESSOR) 25 MG tablet TAKE 1 TABLET BY MOUTH TWICE DAILY 180 tablet 2  . nitroGLYCERIN (NITROSTAT) 0.4 MG SL tablet Place 1 tablet (0.4 mg total) under the tongue every 5 (five) minutes as needed for chest pain. 25 tablet 5  . Omega-3 Fatty Acids (FISH OIL) 1000 MG CAPS Take 2,000 mg by mouth daily.    . rosuvastatin (CRESTOR) 5 MG tablet Take 1 tablet (5 mg total) daily by mouth. 90 tablet 3  . tamsulosin  (FLOMAX) 0.4 MG CAPS capsule Take 0.4 mg by mouth daily.    Marland Kitchen warfarin (COUMADIN) 5 MG tablet TAKE 1 TABLET BY MOUTH AS DIRECTED BY COUMADIN CLINIC 90 tablet 0  . zolpidem (AMBIEN) 5 MG tablet Take 2.5-5 mg by mouth at bedtime as needed for sleep.      No current facility-administered medications for this visit.    Facility-Administered Medications Ordered in Other Visits  Medication Dose Route Frequency Provider Last Rate Last Dose  . 0.9 %  sodium chloride infusion    Continuous PRN Lowella Dell, CRNA        Allergies:   Prednisone; Zocor [simvastatin]; and Oxycodone hcl    Social History:  The patient  reports that he quit smoking about 59 years ago. he has never used smokeless tobacco. He reports that he drinks alcohol. He reports that he does not use drugs.   Family History:  The patient's family history includes Cancer in his brother; Coronary artery disease in his brother and brother; Diabetes in his mother; Fainting in his brother; Heart attack in his brother and father; Liver cancer in his brother; Peripheral vascular disease in his father; Sudden death in his brother.    ROS:  General:no colds or fevers, no weight changes Skin:no rashes or ulcers HEENT:no blurred vision, no congestion CV:see HPI PUL:see HPI GI:no diarrhea constipation or melena, no indigestion GU:no hematuria, no dysuria MS:no joint pain, no claudication Neuro:no syncope, no lightheadedness Endo:no diabetes, no thyroid disease  Wt Readings from Last 3 Encounters:  03/13/17 234 lb 6.4 oz (106.3 kg)  11/04/16 231 lb 12.8 oz (105.1 kg)  10/30/16 229 lb (103.9 kg)     PHYSICAL EXAM: VS:  BP 114/64   Pulse 63   Ht 6\' 2"  (1.88 m)   Wt 234 lb 6.4 oz (106.3 kg)   BMI 30.10 kg/m  , BMI Body mass index is 30.1 kg/m. General:Pleasant affect, NAD Skin:Warm and dry, brisk capillary refill HEENT:normocephalic, sclera clear, mucus membranes moist Neck:supple, no JVD, no bruits  Heart:irreg irreg  without murmur, gallup, rub or click Lungs:clear without rales, rhonchi, or wheezes FWY:OVZC, non tender, + BS, do not palpate liver spleen or masses Ext:no lower ext edema, 2+ pedal pulses, 2+ radial pulses Neuro:alert and oriented X 3, MAE, follows commands, + facial symmetry    EKG:  EKG is ordered today. The ekg ordered today demonstrates A fib with rate 63 and no EKG changes.     Recent Labs: 10/14/2016: TSH 2.193 10/25/2016: BUN 29; Creatinine, Ser 1.25; Hemoglobin 13.6; Platelets 155; Potassium 4.5; Sodium 143 11/04/2016:  ALT 25    Lipid Panel    Component Value Date/Time   CHOL 96 (L) 11/04/2016 0949   CHOL 101 06/17/2013 0821   TRIG 88 11/04/2016 0949   TRIG 75 06/17/2013 0821   HDL 43 11/04/2016 0949   HDL 42 06/17/2013 0821   CHOLHDL 2.2 11/04/2016 0949   CHOLHDL 2.7 09/18/2015 0902   VLDL 30 09/18/2015 0902   LDLCALC 35 11/04/2016 0949   LDLCALC 44 06/17/2013 0821       Other studies Reviewed: Additional studies/ records that were reviewed today include: .  TEE 10/30/16 Study Conclusions  - Left ventricle: The cavity size was normal. Wall thickness was   normal. Systolic function was normal. The estimated ejection   fraction was in the range of 60% to 65%. - Aortic valve: Mildly thickened, mildly calcified leaflets. No   evidence of vegetation. There was trivial regurgitation. - Mitral valve: No evidence of vegetation. There was mild   regurgitation. - Left atrium: No evidence of thrombus in the appendage. - Right atrium: No evidence of thrombus in the atrial cavity or   appendage. - Atrial septum: No defect or patent foramen ovale was identified.   Echo contrast study showed no right-to-left atrial level shunt,   following an increase in RA pressure induced by provocative   maneuvers. - Tricuspid valve: No evidence of vegetation. - Pulmonic valve: No evidence of vegetation.   Cardiac Cath 2005    FINAL IMPRESSION:  1. Atherosclerotic coronary  vascular disease, two-vessel.  2. Status post successful percutaneous coronary intervention/drug-eluting     stent implantation, first circumflex marginal.  3. Intact left ventricular size and global systolic function; ejection     fraction 65%.  4. Typical angina was reproduced with device insertion and balloon     inflation.  5. Negative fractional flow reserve determination, right coronary artery. 6. Intravascular ultrasound; proximal left circumflex revealed a 2.6 x 1.8     diameter ostium.  The calculated area was 4.0 millimeters squared.  Normal Nuc study 07/2008   ASSESSMENT AND PLAN:  1.  Recurrent persistent atrial fib.  - last DCCV was 10/30/16,  Pt believes he went back into a fib on the 11th.  Rate is controlled.  No angina.  Discussed with Dr. Radford Pax and will refer back to a fib clinic for AAD therapy.    2.  Anticoagulation on warfarin and therapeutic.    3.  CAD with hx of stent in 2005 and no further chest pain.  Stent to LCX.    4.  OSA with CPAP continuing to use.      5.  Bicuspid AV stable.    6.  HTN controlled.    Current medicines are reviewed with the patient today.  The patient Has no concerns regarding medicines.  The following changes have been made:  See above Labs/ tests ordered today include:see above  Disposition:   FU:  see above  Signed, [name], NP  03/13/2017 11:43 AM    [address], [place], [place], [place] Phone: [phone]; Fax: [phone]

## 2017-03-13 ENCOUNTER — Ambulatory Visit: Payer: PPO | Admitting: Cardiology

## 2017-03-13 ENCOUNTER — Encounter: Payer: Self-pay | Admitting: Cardiology

## 2017-03-13 VITALS — BP 114/64 | HR 63 | Ht 74.0 in | Wt 234.4 lb

## 2017-03-13 DIAGNOSIS — I481 Persistent atrial fibrillation: Secondary | ICD-10-CM

## 2017-03-13 DIAGNOSIS — I350 Nonrheumatic aortic (valve) stenosis: Secondary | ICD-10-CM

## 2017-03-13 DIAGNOSIS — G4733 Obstructive sleep apnea (adult) (pediatric): Secondary | ICD-10-CM | POA: Diagnosis not present

## 2017-03-13 DIAGNOSIS — I10 Essential (primary) hypertension: Secondary | ICD-10-CM

## 2017-03-13 DIAGNOSIS — I4819 Other persistent atrial fibrillation: Secondary | ICD-10-CM

## 2017-03-13 NOTE — Patient Instructions (Addendum)
Medication Instructions:  Your physician recommends that you continue on your current medications as directed. Please refer to the Current Medication list given to you today.  Labwork: TODAY:  BMET & MAGNESIUM   Testing/Procedures: None ordered  Follow-Up: Your physician recommends that you schedule a follow-up appointment:  AS SOON AS PT CAN GET INTO THE AFIB CLINIC.   Any Other Special Instructions Will Be Listed Below (If Applicable).     If you need a refill on your cardiac medications before your next appointment, please call your pharmacy.

## 2017-03-14 LAB — MAGNESIUM: Magnesium: 1.9 mg/dL (ref 1.6–2.3)

## 2017-03-14 LAB — BASIC METABOLIC PANEL
BUN/Creatinine Ratio: 16 (ref 10–24)
BUN: 21 mg/dL (ref 8–27)
CO2: 21 mmol/L (ref 20–29)
Calcium: 8.8 mg/dL (ref 8.6–10.2)
Chloride: 109 mmol/L — ABNORMAL HIGH (ref 96–106)
Creatinine, Ser: 1.28 mg/dL — ABNORMAL HIGH (ref 0.76–1.27)
GFR calc Af Amer: 60 mL/min/{1.73_m2} (ref >59)
GFR calc non Af Amer: 52 mL/min/{1.73_m2} — ABNORMAL LOW (ref >59)
Glucose: 94 mg/dL (ref 65–99)
Potassium: 4.4 mmol/L (ref 3.5–5.2)
Sodium: 144 mmol/L (ref 134–144)

## 2017-03-14 NOTE — Progress Notes (Signed)
Pt has been made aware of normal result and verbalized understanding.  jw

## 2017-03-17 ENCOUNTER — Ambulatory Visit (HOSPITAL_COMMUNITY)
Admission: RE | Admit: 2017-03-17 | Discharge: 2017-03-17 | Disposition: A | Discharge status: Home / Self Care | Payer: PPO | Source: Ambulatory Visit | Attending: Nurse Practitioner | Admitting: Nurse Practitioner

## 2017-03-17 ENCOUNTER — Encounter (HOSPITAL_COMMUNITY): Payer: Self-pay | Admitting: Nurse Practitioner

## 2017-03-17 VITALS — BP 108/62 | HR 53 | Ht 74.0 in | Wt 234.0 lb

## 2017-03-17 DIAGNOSIS — I4891 Unspecified atrial fibrillation: Secondary | ICD-10-CM | POA: Diagnosis present

## 2017-03-17 DIAGNOSIS — I480 Paroxysmal atrial fibrillation: Secondary | ICD-10-CM | POA: Insufficient documentation

## 2017-03-17 DIAGNOSIS — Z955 Presence of coronary angioplasty implant and graft: Secondary | ICD-10-CM | POA: Insufficient documentation

## 2017-03-17 DIAGNOSIS — G4733 Obstructive sleep apnea (adult) (pediatric): Secondary | ICD-10-CM | POA: Insufficient documentation

## 2017-03-17 DIAGNOSIS — E785 Hyperlipidemia, unspecified: Secondary | ICD-10-CM | POA: Insufficient documentation

## 2017-03-17 DIAGNOSIS — Z888 Allergy status to other drugs, medicaments and biological substances status: Secondary | ICD-10-CM | POA: Insufficient documentation

## 2017-03-17 DIAGNOSIS — E669 Obesity, unspecified: Secondary | ICD-10-CM | POA: Insufficient documentation

## 2017-03-17 DIAGNOSIS — Z87891 Personal history of nicotine dependence: Secondary | ICD-10-CM | POA: Diagnosis not present

## 2017-03-17 DIAGNOSIS — I2510 Atherosclerotic heart disease of native coronary artery without angina pectoris: Secondary | ICD-10-CM | POA: Diagnosis not present

## 2017-03-17 DIAGNOSIS — Z96652 Presence of left artificial knee joint: Secondary | ICD-10-CM | POA: Diagnosis not present

## 2017-03-17 DIAGNOSIS — Z79899 Other long term (current) drug therapy: Secondary | ICD-10-CM | POA: Insufficient documentation

## 2017-03-17 DIAGNOSIS — Z885 Allergy status to narcotic agent status: Secondary | ICD-10-CM | POA: Insufficient documentation

## 2017-03-17 DIAGNOSIS — I252 Old myocardial infarction: Secondary | ICD-10-CM | POA: Insufficient documentation

## 2017-03-17 DIAGNOSIS — Z8546 Personal history of malignant neoplasm of prostate: Secondary | ICD-10-CM | POA: Diagnosis not present

## 2017-03-17 DIAGNOSIS — K219 Gastro-esophageal reflux disease without esophagitis: Secondary | ICD-10-CM | POA: Diagnosis not present

## 2017-03-17 DIAGNOSIS — I350 Nonrheumatic aortic (valve) stenosis: Secondary | ICD-10-CM | POA: Diagnosis not present

## 2017-03-17 DIAGNOSIS — I10 Essential (primary) hypertension: Secondary | ICD-10-CM | POA: Diagnosis not present

## 2017-03-17 DIAGNOSIS — Z7901 Long term (current) use of anticoagulants: Secondary | ICD-10-CM | POA: Insufficient documentation

## 2017-03-17 DIAGNOSIS — I444 Left anterior fascicular block: Secondary | ICD-10-CM | POA: Diagnosis not present

## 2017-03-17 NOTE — Progress Notes (Addendum)
Primary Care Physician: Lavone Orn, MD Referring Physician: [name] is a 84 y.o. male with a h/o CAD, bicuspid aortic valve with mild aortic stenosis, perisistent afib that  had successful cardioversion in October of 2018. He recently was seen by [name] and was back in afib. He is here to further d/c options to restore SR. He read on line re Tikosyn had a lot of side effects and is wanting to use sotalol but he is very slow at baseline. He states that he is not bothered with symptoms from afib and that he is also considering living in afib.  Today, he denies symptoms of palpitations, chest pain, shortness of breath, orthopnea, PND, lower extremity edema, dizziness, presyncope, syncope, or neurologic sequela. The patient is tolerating medications without difficulties and is otherwise without complaint today.   Past Medical History:  Diagnosis Date  . Aortic stenosis    mild by echo 09/2016  . Coronary disease    2 Vessel,inferior wall MI june 2003, PTCA and Stent RCA June 2003, PTCA stent, Left Circumflex OM 02 Mar 2003, Dr Radford Pax  . Dilated aortic root ([place])    52mm by echo 09/2016  . DJD (degenerative joint disease) of knee    LEFT  . Dyslipidemia   . Dysrhythmia    afib  . GERD (gastroesophageal reflux disease)   . History of echocardiogram 08/2010   Bicuspid aortic valve w moderate aortic valve sclerosis,mild AS and mildly dilated aorta   . History of rotator cuff syndrome    (Right) with biceps tendinitis  . Hypertension   . Obesity   . OSA (obstructive sleep apnea)    presented with HA and drowsiness,mild sleep apnea with oxygen desats to 78%  . Prostate cancer ([place])    /BPH, radioactive seed 2007, Nesi    Past Surgical History:  Procedure Laterality Date  . CARDIAC CATHETERIZATION    . CARDIOVERSION N/A 08/27/2013   Procedure: CARDIOVERSION;  Surgeon: Sueanne Margarita, MD;  Location: [facility] ENDOSCOPY;  Service: Cardiovascular;  Laterality: N/A;  .  CARDIOVERSION N/A 03/15/2016   Procedure: CARDIOVERSION;  Surgeon: Dorothy Spark, MD;  Location: [facility];  Service: Cardiovascular;  Laterality: N/A;  . CARDIOVERSION N/A 10/30/2016   Procedure: CARDIOVERSION;  Surgeon: Jerline Pain, MD;  Location: [facility] ENDOSCOPY;  Service: Cardiovascular;  Laterality: N/A;  . CORONARY ANGIOPLASTY    . JOINT REPLACEMENT     left knee replacement 2009  . TEE WITHOUT CARDIOVERSION N/A 10/30/2016   Procedure: TRANSESOPHAGEAL ECHOCARDIOGRAM (TEE);  Surgeon: Jerline Pain, MD;  Location: [facility] ENDOSCOPY;  Service: Cardiovascular;  Laterality: N/A;    Current Outpatient Medications  Medication Sig Dispense Refill  . ezetimibe (ZETIA) 10 MG tablet Take 1 tablet (10 mg total) by mouth daily. 90 tablet 2  . loratadine (CLARITIN) 10 MG tablet Take 10 mg by mouth daily as needed for allergies.    . metoprolol tartrate (LOPRESSOR) 25 MG tablet TAKE 1 TABLET BY MOUTH TWICE DAILY 180 tablet 2  . nitroGLYCERIN (NITROSTAT) 0.4 MG SL tablet Place 1 tablet (0.4 mg total) under the tongue every 5 (five) minutes as needed for chest pain. 25 tablet 5  . Omega-3 Fatty Acids (FISH OIL) 1000 MG CAPS Take 2,000 mg by mouth daily.    . rosuvastatin (CRESTOR) 5 MG tablet Take 1 tablet (5 mg total) daily by mouth. 90 tablet 3  . tamsulosin (FLOMAX) 0.4 MG CAPS capsule Take 0.4 mg by mouth  daily.    . warfarin (COUMADIN) 5 MG tablet TAKE 1 TABLET BY MOUTH AS DIRECTED BY COUMADIN CLINIC 90 tablet 0  . zolpidem (AMBIEN) 5 MG tablet Take 2.5-5 mg by mouth at bedtime as needed for sleep.      No current facility-administered medications for this encounter.    Facility-Administered Medications Ordered in Other Encounters  Medication Dose Route Frequency Provider Last Rate Last Dose  . 0.9 %  sodium chloride infusion    Continuous PRN Lowella Dell, CRNA        Allergies  Allergen Reactions  . Prednisone Other (See Comments)    REACTION: anxiety reaction  . Zocor  [Simvastatin] Other (See Comments)    REACTION: Muscle aches  . Oxycodone Hcl Rash    Social History   Socioeconomic History  . Marital status: Married    Spouse name: Not on file  . Number of children: Not on file  . Years of education: Not on file  . Highest education level: Not on file  Social Needs  . Financial resource strain: Not on file  . Food insecurity - worry: Not on file  . Food insecurity - inability: Not on file  . Transportation needs - medical: Not on file  . Transportation needs - non-medical: Not on file  Occupational History  . Not on file  Tobacco Use  . Smoking status: Former Smoker    Last attempt to quit: 01/28/1958    Years since quitting: 59.1  . Smokeless tobacco: Never Used  Substance and Sexual Activity  . Alcohol use: Yes    Comment: "a few a week"  . Drug use: No  . Sexual activity: Not on file  Other Topics Concern  . Not on file  Social History Narrative  . Not on file    Family History  Problem Relation Age of Onset  . Diabetes Mother   . Peripheral vascular disease Father   . Heart attack Father   . Coronary artery disease Brother   . Liver cancer Brother   . Coronary artery disease Brother   . Heart attack Brother   . Cancer Brother   . Sudden death Brother   . Fainting Brother     ROS- All systems are reviewed and negative except as per the HPI above  Physical Exam: Vitals:   03/17/17 1010  BP: 108/62  Pulse: (!) 53  Weight: 234 lb (106.1 kg)  Height: 6\' 2"  (1.88 m)   Wt Readings from Last 3 Encounters:  03/17/17 234 lb (106.1 kg)  03/13/17 234 lb 6.4 oz (106.3 kg)  11/04/16 231 lb 12.8 oz (105.1 kg)    Labs: Lab Results  Component Value Date   NA 144 03/13/2017   K 4.4 03/13/2017   CL 109 (H) 03/13/2017   CO2 21 03/13/2017   GLUCOSE 94 03/13/2017   BUN 21 03/13/2017   CREATININE 1.28 (H) 03/13/2017   CALCIUM 8.8 03/13/2017   MG 1.9 03/13/2017   Lab Results  Component Value Date   INR 2.7 02/25/2017    Lab Results  Component Value Date   CHOL 96 (L) 11/04/2016   HDL 43 11/04/2016   LDLCALC 35 11/04/2016   TRIG 88 11/04/2016     GEN- The patient is well appearing, alert and oriented x 3 today.   Head- normocephalic, atraumatic Eyes-  Sclera clear, conjunctiva pink Ears- hearing intact Oropharynx- clear Neck- supple, no JVP Lymph- no cervical lymphadenopathy Lungs- Clear to ausculation bilaterally, normal  work of breathing Heart- irregular rate and rhythm, no murmurs, rubs or gallops, PMI not laterally displaced GI- soft, NT, ND, + BS Extremities- no clubbing, cyanosis, or edema MS- no significant deformity or atrophy Skin- no rash or lesion Psych- euthymic mood, full affect Neuro- strength and sensation are intact  EKG-afib at 53 bpm, qrs int 94 ms, qtc 409 ms Echo-Study Conclusions  - Left ventricle: Wall thickness was increased in a pattern of   severe LVH. The estimated ejection fraction was 55%. - Aortic valve: MIld AS gradients stable since September 2017.   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 Valve area (VTI):   2.92 cm^2. Valve area (Vmax): 2.71 cm^2. Valve area (Vmean): 2.56   cm^2. - Left atrium: The atrium was moderately dilated. - Right ventricle: The cavity size was mildly dilated. - Atrial septum: No defect or patent foramen ovale was identified.    Assessment and Plan: 1. Paroxysmal afib Pt given options of restoring SR He has never been on antiarrythmic therapy and would not be a candidate for flecainide 2/2 structural heart changes and CAD. Tikosyn and  sotalol were discussed in detail RIght now he is leaning toward sotalol, bradycardia at baseline might be  an issue, metoprolol would have to be reduced/stopped  He would like to contact insurance to see if Tikosyn would be affortable He is on warfarin and would need 4 weekly INR's before admission per guidelines He will let me know his decision and will proceed from there  F/u with Dr.  Radford Pax 9/25 as already scheduled  Addendum-2/20, pt did check on price of Tikosyn and it will be more affordable than he thought 180$ for 3 months. He is still thinking things over. His planting season starts soon and if he can do the activities he enjoys will little symptoms, he plans to continue with living in Afib. If more symptomatic, he will let me know and plan on Tikosyn.   Geroge Baseman Patrizia Paule, [facility] [address] [place], [place] [address] [phone]

## 2017-03-19 DIAGNOSIS — R220 Localized swelling, mass and lump, head: Secondary | ICD-10-CM | POA: Diagnosis not present

## 2017-03-19 NOTE — Addendum Note (Signed)
Encounter addended by: Sherran Needs, NP on: 03/19/2017 5:06 PM  Actions taken: Sign clinical note

## 2017-04-08 ENCOUNTER — Ambulatory Visit (INDEPENDENT_AMBULATORY_CARE_PROVIDER_SITE_OTHER): Payer: PPO | Admitting: *Deleted

## 2017-04-08 DIAGNOSIS — I481 Persistent atrial fibrillation: Secondary | ICD-10-CM | POA: Diagnosis not present

## 2017-04-08 DIAGNOSIS — I480 Paroxysmal atrial fibrillation: Secondary | ICD-10-CM | POA: Diagnosis not present

## 2017-04-08 DIAGNOSIS — I4819 Other persistent atrial fibrillation: Secondary | ICD-10-CM

## 2017-04-08 DIAGNOSIS — Z5181 Encounter for therapeutic drug level monitoring: Secondary | ICD-10-CM | POA: Diagnosis not present

## 2017-04-08 LAB — POCT INR: INR: 2.9

## 2017-04-08 NOTE — Patient Instructions (Signed)
Description   Continue on same dosage 1/2 tablet daily except 1 tablet on Mondays, Wednesdays and Fridays. Recheck INR in 6 weeks.

## 2017-04-21 DIAGNOSIS — J329 Chronic sinusitis, unspecified: Secondary | ICD-10-CM | POA: Diagnosis not present

## 2017-04-23 DIAGNOSIS — N400 Enlarged prostate without lower urinary tract symptoms: Secondary | ICD-10-CM | POA: Diagnosis not present

## 2017-04-23 DIAGNOSIS — I480 Paroxysmal atrial fibrillation: Secondary | ICD-10-CM | POA: Diagnosis not present

## 2017-04-23 DIAGNOSIS — Z8546 Personal history of malignant neoplasm of prostate: Secondary | ICD-10-CM | POA: Diagnosis not present

## 2017-04-23 DIAGNOSIS — I10 Essential (primary) hypertension: Secondary | ICD-10-CM | POA: Diagnosis not present

## 2017-04-23 DIAGNOSIS — I2510 Atherosclerotic heart disease of native coronary artery without angina pectoris: Secondary | ICD-10-CM | POA: Diagnosis not present

## 2017-04-23 DIAGNOSIS — M159 Polyosteoarthritis, unspecified: Secondary | ICD-10-CM | POA: Diagnosis not present

## 2017-05-09 ENCOUNTER — Other Ambulatory Visit: Payer: Self-pay | Admitting: Cardiology

## 2017-05-20 ENCOUNTER — Ambulatory Visit: Payer: PPO | Admitting: Pharmacist

## 2017-05-20 DIAGNOSIS — I480 Paroxysmal atrial fibrillation: Secondary | ICD-10-CM

## 2017-05-20 DIAGNOSIS — Z5181 Encounter for therapeutic drug level monitoring: Secondary | ICD-10-CM | POA: Diagnosis not present

## 2017-05-20 DIAGNOSIS — I4819 Other persistent atrial fibrillation: Secondary | ICD-10-CM

## 2017-05-20 DIAGNOSIS — I481 Persistent atrial fibrillation: Secondary | ICD-10-CM

## 2017-05-20 LAB — POCT INR: INR: 2.3

## 2017-05-20 NOTE — Patient Instructions (Signed)
Description   Continue on same dosage 1/2 tablet daily except 1 tablet on Mondays, Wednesdays and Fridays. Recheck INR in 6 weeks.

## 2017-07-01 ENCOUNTER — Ambulatory Visit (INDEPENDENT_AMBULATORY_CARE_PROVIDER_SITE_OTHER): Payer: PPO | Admitting: *Deleted

## 2017-07-01 DIAGNOSIS — I480 Paroxysmal atrial fibrillation: Secondary | ICD-10-CM | POA: Diagnosis not present

## 2017-07-01 DIAGNOSIS — Z5181 Encounter for therapeutic drug level monitoring: Secondary | ICD-10-CM | POA: Diagnosis not present

## 2017-07-01 DIAGNOSIS — I4819 Other persistent atrial fibrillation: Secondary | ICD-10-CM

## 2017-07-01 LAB — POCT INR: INR: 3.5 — AB (ref 2.0–3.0)

## 2017-07-01 NOTE — Patient Instructions (Signed)
Description   Do not take any Coumadin today then continue on same dosage 1/2 tablet daily except 1 tablet on Mondays, Wednesdays and Fridays. Recheck INR in 3 weeks with MD appt.

## 2017-07-16 DIAGNOSIS — I480 Paroxysmal atrial fibrillation: Secondary | ICD-10-CM | POA: Diagnosis not present

## 2017-07-16 DIAGNOSIS — N400 Enlarged prostate without lower urinary tract symptoms: Secondary | ICD-10-CM | POA: Diagnosis not present

## 2017-07-16 DIAGNOSIS — I2510 Atherosclerotic heart disease of native coronary artery without angina pectoris: Secondary | ICD-10-CM | POA: Diagnosis not present

## 2017-07-16 DIAGNOSIS — Z8546 Personal history of malignant neoplasm of prostate: Secondary | ICD-10-CM | POA: Diagnosis not present

## 2017-07-16 DIAGNOSIS — I10 Essential (primary) hypertension: Secondary | ICD-10-CM | POA: Diagnosis not present

## 2017-07-16 DIAGNOSIS — M159 Polyosteoarthritis, unspecified: Secondary | ICD-10-CM | POA: Diagnosis not present

## 2017-07-19 ENCOUNTER — Encounter: Payer: Self-pay | Admitting: Cardiology

## 2017-07-21 ENCOUNTER — Ambulatory Visit (INDEPENDENT_AMBULATORY_CARE_PROVIDER_SITE_OTHER): Payer: PPO | Admitting: *Deleted

## 2017-07-21 ENCOUNTER — Encounter: Payer: Self-pay | Admitting: Cardiology

## 2017-07-21 ENCOUNTER — Ambulatory Visit: Payer: PPO | Admitting: Cardiology

## 2017-07-21 ENCOUNTER — Telehealth: Payer: Self-pay | Admitting: *Deleted

## 2017-07-21 VITALS — BP 128/70 | HR 46 | Ht 74.0 in | Wt 234.8 lb

## 2017-07-21 DIAGNOSIS — I10 Essential (primary) hypertension: Secondary | ICD-10-CM

## 2017-07-21 DIAGNOSIS — I480 Paroxysmal atrial fibrillation: Secondary | ICD-10-CM | POA: Diagnosis not present

## 2017-07-21 DIAGNOSIS — I350 Nonrheumatic aortic (valve) stenosis: Secondary | ICD-10-CM | POA: Diagnosis not present

## 2017-07-21 DIAGNOSIS — I77810 Thoracic aortic ectasia: Secondary | ICD-10-CM

## 2017-07-21 DIAGNOSIS — Z5181 Encounter for therapeutic drug level monitoring: Secondary | ICD-10-CM | POA: Diagnosis not present

## 2017-07-21 DIAGNOSIS — I2510 Atherosclerotic heart disease of native coronary artery without angina pectoris: Secondary | ICD-10-CM | POA: Diagnosis not present

## 2017-07-21 DIAGNOSIS — I481 Persistent atrial fibrillation: Secondary | ICD-10-CM

## 2017-07-21 DIAGNOSIS — I4819 Other persistent atrial fibrillation: Secondary | ICD-10-CM

## 2017-07-21 DIAGNOSIS — E7800 Pure hypercholesterolemia, unspecified: Secondary | ICD-10-CM

## 2017-07-21 DIAGNOSIS — G4733 Obstructive sleep apnea (adult) (pediatric): Secondary | ICD-10-CM | POA: Diagnosis not present

## 2017-07-21 LAB — POCT INR: INR: 3 (ref 2.0–3.0)

## 2017-07-21 NOTE — Patient Instructions (Addendum)
Description   Continue on same dosage 1/2 tablet daily except 1 tablet on Mondays, Wednesdays and Fridays. Recheck INR in 4 weeks. Call us with any medication changes or concerns to # [phone] Coumadin Clinic.

## 2017-07-21 NOTE — Telephone Encounter (Signed)
Resmed Airfit F30 mask  Order faxed today

## 2017-07-21 NOTE — Patient Instructions (Signed)
Medication Instructions:  Your physician recommends that you continue on your current medications as directed. Please refer to the Current Medication list given to you today.  If you need a refill on your cardiac medications, please contact your pharmacy first.  Labwork: None ordered   Testing/Procedures: None ordered   Follow-Up: Your physician wants you to follow-up in: 6 months with Dr. Radford Pax. You will receive a reminder letter in the mail two months in advance. If you don't receive a letter, please call our office to schedule the follow-up appointment.  Any Other Special Instructions Will Be Listed Below (If Applicable). Dr. Radford Pax ordered you a Resmed Airfit F30 mask.    Thank you for choosing [facility], RN  [phone]  If you need a refill on your cardiac medications before your next appointment, please call your pharmacy.

## 2017-07-21 NOTE — Telephone Encounter (Signed)
-----   Message from Teressa Senter, RN sent at 07/21/2017  9:06 AM EDT ----- Regarding: dme order  DME order placed   Thanks  Rena

## 2017-07-21 NOTE — Progress Notes (Signed)
Cardiology Office Note:    Date:  07/21/2017   ID:  Randall Chen, DOB 1932/11/13, MRN [id number]  PCP:  Lavone Orn, MD  Cardiologist:  No primary care provider on file.    Referring MD: Lavone Orn, MD   Chief Complaint  Patient presents with  . Coronary Artery Disease  . Hypertension  . Hyperlipidemia  . Atrial Fibrillation  . Sleep Apnea    History of Present Illness:    Randall Chen is a 84 y.o. male with a hx of ASCAD s/p inferior MI 2003 with PCI of RCA 6/03 and then PCI of left circ/OM 2/05, dyslipidemia, bicuspid AV with mild AS, mildly dilated aorta, PAF, OSA on CPAP and HTN.  He had a reoccurrence of PAF in Feb 2018 and underwent successful DCCV to NSR.  He was seen in the afib clinic 10/14/2016 with complaints of palpitations for 1 week after a gambling weekend in [place] where he drank more ETOH than usual.  Due to subtherapeutic INR, he underwent TEE/DCCV to NSR on 10/30/2016. He was seen back in afib clinic in Feb 2019 with recurrent afib.  ADD therapy was discussed with only option being Tikosyn.  Is has bradycardia so Sotolol was not an option.  He wanted to wait a while before making the decision.   He is here today for followup and is doing well.  He denies any chest pain or pressure, SOB, DOE, PND, orthopnea, LE edema, dizziness,  or syncope. He can still feel his heart beat irregularly when he is exerting himself. He is compliant with his meds and is tolerating meds with no SE.    Past Medical History:  Diagnosis Date  . Aortic stenosis    mild by echo 09/2016  . Coronary disease    2 Vessel,inferior wall MI june 2003, PTCA and Stent RCA June 2003, PTCA stent, Left Circumflex OM 02 Mar 2003, Dr Radford Pax  . Dilated aortic root ([place])    28mm by echo 09/2016  . DJD (degenerative joint disease) of knee    LEFT  . Dyslipidemia   . Dysrhythmia    afib  . GERD (gastroesophageal reflux disease)   . History of echocardiogram 08/2010   Bicuspid aortic valve w  moderate aortic valve sclerosis,mild AS and mildly dilated aorta   . History of rotator cuff syndrome    (Right) with biceps tendinitis  . Hypertension   . Obesity   . OSA (obstructive sleep apnea)    presented with HA and drowsiness,mild sleep apnea with oxygen desats to 78%  . Prostate cancer ([place])    /BPH, radioactive seed 2007, Nesi     Past Surgical History:  Procedure Laterality Date  . CARDIAC CATHETERIZATION    . CARDIOVERSION N/A 08/27/2013   Procedure: CARDIOVERSION;  Surgeon: Sueanne Margarita, MD;  Location: [facility] ENDOSCOPY;  Service: Cardiovascular;  Laterality: N/A;  . CARDIOVERSION N/A 03/15/2016   Procedure: CARDIOVERSION;  Surgeon: Dorothy Spark, MD;  Location: [facility];  Service: Cardiovascular;  Laterality: N/A;  . CARDIOVERSION N/A 10/30/2016   Procedure: CARDIOVERSION;  Surgeon: Jerline Pain, MD;  Location: [facility] ENDOSCOPY;  Service: Cardiovascular;  Laterality: N/A;  . CORONARY ANGIOPLASTY    . JOINT REPLACEMENT     left knee replacement 2009  . TEE WITHOUT CARDIOVERSION N/A 10/30/2016   Procedure: TRANSESOPHAGEAL ECHOCARDIOGRAM (TEE);  Surgeon: Jerline Pain, MD;  Location: [facility] ENDOSCOPY;  Service: Cardiovascular;  Laterality: N/A;    Current Medications: Current Meds  Medication  Sig  . ezetimibe (ZETIA) 10 MG tablet Take 1 tablet (10 mg total) by mouth daily.  . metoprolol tartrate (LOPRESSOR) 25 MG tablet TAKE 1 TABLET BY MOUTH TWICE DAILY  . nitroGLYCERIN (NITROSTAT) 0.4 MG SL tablet Place 1 tablet (0.4 mg total) under the tongue every 5 (five) minutes as needed for chest pain.  . rosuvastatin (CRESTOR) 5 MG tablet Take 1 tablet (5 mg total) daily by mouth.  . tamsulosin (FLOMAX) 0.4 MG CAPS capsule Take 0.4 mg by mouth daily.  Marland Kitchen warfarin (COUMADIN) 5 MG tablet Take as directed by coumadin clinic  . zolpidem (AMBIEN) 5 MG tablet Take 2.5-5 mg by mouth at bedtime as needed for sleep.      Allergies:   Prednisone; Zocor [simvastatin]; and Oxycodone hcl    Social History   Socioeconomic History  . Marital status: Married    Spouse name: Not on file  . Number of children: Not on file  . Years of education: Not on file  . Highest education level: Not on file  Occupational History  . Not on file  Social Needs  . Financial resource strain: Not on file  . Food insecurity:    Worry: Not on file    Inability: Not on file  . Transportation needs:    Medical: Not on file    Non-medical: Not on file  Tobacco Use  . Smoking status: Former Smoker    Last attempt to quit: 01/28/1958    Years since quitting: 59.5  . Smokeless tobacco: Never Used  Substance and Sexual Activity  . Alcohol use: Yes    Comment: "a few a week"  . Drug use: No  . Sexual activity: Not on file  Lifestyle  . Physical activity:    Days per week: Not on file    Minutes per session: Not on file  . Stress: Not on file  Relationships  . Social connections:    Talks on phone: Not on file    Gets together: Not on file    Attends religious service: Not on file    Active member of club or organization: Not on file    Attends meetings of clubs or organizations: Not on file    Relationship status: Not on file  Other Topics Concern  . Not on file  Social History Narrative  . Not on file     Family History: The patient's family history includes Cancer in his brother; Coronary artery disease in his brother and brother; Diabetes in his mother; Fainting in his brother; Heart attack in his brother and father; Liver cancer in his brother; Peripheral vascular disease in his father; Sudden death in his brother.  ROS:   Please see the history of present illness.    ROS  All other systems reviewed and negative.   EKGs/Labs/Other Studies Reviewed:    The following studies were reviewed today: none  EKG:  EKG is  ordered today.  The ekg ordered today demonstrates atrial fibrillation with SVR  Recent Labs: 10/14/2016: TSH 2.193 10/25/2016: Hemoglobin 13.6; Platelets  155 11/04/2016: ALT 25 03/13/2017: BUN 21; Creatinine, Ser 1.28; Magnesium 1.9; Potassium 4.4; Sodium 144   Recent Lipid Panel    Component Value Date/Time   CHOL 96 (L) 11/04/2016 0949   CHOL 101 06/17/2013 0821   TRIG 88 11/04/2016 0949   TRIG 75 06/17/2013 0821   HDL 43 11/04/2016 0949   HDL 42 06/17/2013 0821   CHOLHDL 2.2 11/04/2016 0949   CHOLHDL 2.7  09/18/2015 0902   VLDL 30 09/18/2015 0902   LDLCALC 35 11/04/2016 0949   LDLCALC 44 06/17/2013 0821    Physical Exam:    VS:  BP 128/70   Pulse (!) 46   Ht 6\' 2"  (1.88 m)   Wt 234 lb 12.8 oz (106.5 kg)   SpO2 96%   BMI 30.15 kg/m     Wt Readings from Last 3 Encounters:  07/21/17 234 lb 12.8 oz (106.5 kg)  03/17/17 234 lb (106.1 kg)  03/13/17 234 lb 6.4 oz (106.3 kg)     GEN:  Well nourished, well developed in no acute distress HEENT: Normal NECK: No JVD; No carotid bruits LYMPHATICS: No lymphadenopathy CARDIAC: RRR, no murmurs, rubs, gallops RESPIRATORY:  Clear to auscultation without rales, wheezing or rhonchi  ABDOMEN: Soft, non-tender, non-distended MUSCULOSKELETAL:  No edema; No deformity  SKIN: Warm and dry NEUROLOGIC:  Alert and oriented x 3 PSYCHIATRIC:  Normal affect   ASSESSMENT:    1. Atherosclerosis of native coronary artery of native heart without angina pectoris   2. Essential hypertension, benign   3. Nonrheumatic aortic valve stenosis   4. Dilated aortic root (H[facility])   5. Persistent atrial fibrillation ([place])   6. Pure hypercholesterolemia   7. OSA (obstructive sleep apnea)    PLAN:    In order of problems listed above:  1.  ASCAD -  s/p inferior MI 2003 with PCI of RCA 6/03 and then PCI of left circ/OM 2/05.  He denies any anginal sx.  He is not on ASA due to warfarin.  He will continue on statin.   2.  HTN - BP is well controlled on exam today.  He will continue on Lopressor 25mg  BID.    3.  AS - mild by 2D echo 09/2016  4.  Dilated aortic root echo 09/2016 showed aortic root diameter  37mm and ascending aorta 37mm.  Repeat echo 9./2019 for progression.   5.  Persistent atrial fibrillation - he remains in atrial fibrillation with SVR.   He would like to switch to long acting Toprol so I will call in Rx for Toprol XL 12.5mg  daily.  He has decided to proceed with Tikosyn loading so I will let Roderic Palau, NP know so she can set this up with EP and then after load he will need DCCV.    6.  Hyperlipidemia with LDL goal < 70.  His LDL was 35 on 10/2016.  He will continue on crestor 5mg  daily and Zetia 10mg  daily.    7. OSA - the patient is tolerating PAP therapy well without any problems. The PAP download was reviewed today and showed an AHI of 5.1/hr on 7 cm H2O with 88% compliance in using more than 4 hours nightly.  The patient has been using and benefiting from PAP use and will continue to benefit from therapy.      Medication Adjustments/Labs and Tests Ordered: Current medicines are reviewed at length with the patient today.  Concerns regarding medicines are outlined above.  No orders of the defined types were placed in this encounter.  No orders of the defined types were placed in this encounter.   Signed, Fransico Him, MD  07/21/2017 8:40 AM    [place]

## 2017-07-22 ENCOUNTER — Telehealth: Payer: Self-pay | Admitting: Pharmacist

## 2017-07-22 NOTE — Telephone Encounter (Signed)
Spoke with patient and weekly INRs set up for Tikosyn admission.

## 2017-07-22 NOTE — Telephone Encounter (Signed)
Medication list reviewed in anticipation of upcoming Tikosyn initiation. Patient is not taking any contraindicated or QTc prolonging medications.   Patient is anticoagulated on warfarin and will be scheduled for weekly INRs. Of note INR was therapeutic yesterday.   Patient will need to be counseled to avoid use of Benadryl while on Tikosyn and in the 2-3 days prior to Tikosyn initiation.

## 2017-07-28 ENCOUNTER — Ambulatory Visit (INDEPENDENT_AMBULATORY_CARE_PROVIDER_SITE_OTHER): Payer: PPO | Admitting: Pharmacist

## 2017-07-28 DIAGNOSIS — I481 Persistent atrial fibrillation: Secondary | ICD-10-CM

## 2017-07-28 DIAGNOSIS — Z5181 Encounter for therapeutic drug level monitoring: Secondary | ICD-10-CM

## 2017-07-28 DIAGNOSIS — I480 Paroxysmal atrial fibrillation: Secondary | ICD-10-CM | POA: Diagnosis not present

## 2017-07-28 DIAGNOSIS — I4819 Other persistent atrial fibrillation: Secondary | ICD-10-CM

## 2017-07-28 LAB — POCT INR: INR: 3.6 — AB (ref 2.0–3.0)

## 2017-07-28 NOTE — Patient Instructions (Signed)
Description   Take 1/2 tablet today, then continue on same dosage 1/2 tablet daily except 1 tablet on Mondays, Wednesdays and Fridays. Recheck INR in 1 week - pending Tikosyn admission. Call us with any medication changes or concerns to # [phone] Coumadin Clinic.

## 2017-08-04 ENCOUNTER — Ambulatory Visit: Payer: PPO | Admitting: *Deleted

## 2017-08-04 DIAGNOSIS — I481 Persistent atrial fibrillation: Secondary | ICD-10-CM | POA: Diagnosis not present

## 2017-08-04 DIAGNOSIS — I480 Paroxysmal atrial fibrillation: Secondary | ICD-10-CM

## 2017-08-04 DIAGNOSIS — I4819 Other persistent atrial fibrillation: Secondary | ICD-10-CM

## 2017-08-04 DIAGNOSIS — Z5181 Encounter for therapeutic drug level monitoring: Secondary | ICD-10-CM | POA: Diagnosis not present

## 2017-08-04 LAB — POCT INR: INR: 3 (ref 2.0–3.0)

## 2017-08-04 NOTE — Patient Instructions (Signed)
Description   Continue on same dosage of coumadin  1/2 tablet daily except 1 tablet on Mondays, Wednesdays and Fridays. Recheck INR in 1 week - pending Tikosyn admission. Call us with any medication changes or concerns to # [phone] Coumadin Clinic.

## 2017-08-11 ENCOUNTER — Encounter (HOSPITAL_COMMUNITY): Payer: Self-pay | Admitting: Nurse Practitioner

## 2017-08-11 ENCOUNTER — Ambulatory Visit: Payer: PPO | Admitting: *Deleted

## 2017-08-11 ENCOUNTER — Ambulatory Visit (HOSPITAL_BASED_OUTPATIENT_CLINIC_OR_DEPARTMENT_OTHER)
Admission: RE | Admit: 2017-08-11 | Discharge: 2017-08-11 | Disposition: A | Discharge status: Home / Self Care | Payer: PPO | Source: Ambulatory Visit | Attending: Nurse Practitioner | Admitting: Nurse Practitioner

## 2017-08-11 ENCOUNTER — Inpatient Hospital Stay (HOSPITAL_COMMUNITY)
Admission: RE | Admit: 2017-08-11 | Discharge: 2017-08-15 | DRG: 310 | Disposition: A | Discharge status: Home / Self Care | Payer: PPO | Source: Ambulatory Visit | Attending: Cardiology | Admitting: Cardiology

## 2017-08-11 VITALS — BP 118/70 | HR 54 | Ht 74.0 in | Wt 235.8 lb

## 2017-08-11 DIAGNOSIS — Z79899 Other long term (current) drug therapy: Secondary | ICD-10-CM

## 2017-08-11 DIAGNOSIS — N400 Enlarged prostate without lower urinary tract symptoms: Secondary | ICD-10-CM | POA: Diagnosis present

## 2017-08-11 DIAGNOSIS — I252 Old myocardial infarction: Secondary | ICD-10-CM

## 2017-08-11 DIAGNOSIS — I481 Persistent atrial fibrillation: Principal | ICD-10-CM

## 2017-08-11 DIAGNOSIS — Z5181 Encounter for therapeutic drug level monitoring: Secondary | ICD-10-CM

## 2017-08-11 DIAGNOSIS — E663 Overweight: Secondary | ICD-10-CM | POA: Diagnosis not present

## 2017-08-11 DIAGNOSIS — I480 Paroxysmal atrial fibrillation: Secondary | ICD-10-CM

## 2017-08-11 DIAGNOSIS — Z6830 Body mass index (BMI) 30.0-30.9, adult: Secondary | ICD-10-CM | POA: Diagnosis not present

## 2017-08-11 DIAGNOSIS — Z8546 Personal history of malignant neoplasm of prostate: Secondary | ICD-10-CM

## 2017-08-11 DIAGNOSIS — I10 Essential (primary) hypertension: Secondary | ICD-10-CM | POA: Diagnosis present

## 2017-08-11 DIAGNOSIS — Z800 Family history of malignant neoplasm of digestive organs: Secondary | ICD-10-CM | POA: Diagnosis not present

## 2017-08-11 DIAGNOSIS — Z955 Presence of coronary angioplasty implant and graft: Secondary | ICD-10-CM | POA: Diagnosis not present

## 2017-08-11 DIAGNOSIS — Z96652 Presence of left artificial knee joint: Secondary | ICD-10-CM | POA: Diagnosis not present

## 2017-08-11 DIAGNOSIS — K219 Gastro-esophageal reflux disease without esophagitis: Secondary | ICD-10-CM | POA: Diagnosis present

## 2017-08-11 DIAGNOSIS — Z7901 Long term (current) use of anticoagulants: Secondary | ICD-10-CM

## 2017-08-11 DIAGNOSIS — I2510 Atherosclerotic heart disease of native coronary artery without angina pectoris: Secondary | ICD-10-CM | POA: Diagnosis present

## 2017-08-11 DIAGNOSIS — E785 Hyperlipidemia, unspecified: Secondary | ICD-10-CM | POA: Diagnosis present

## 2017-08-11 DIAGNOSIS — Z833 Family history of diabetes mellitus: Secondary | ICD-10-CM | POA: Diagnosis not present

## 2017-08-11 DIAGNOSIS — Z8249 Family history of ischemic heart disease and other diseases of the circulatory system: Secondary | ICD-10-CM | POA: Diagnosis not present

## 2017-08-11 DIAGNOSIS — G4733 Obstructive sleep apnea (adult) (pediatric): Secondary | ICD-10-CM | POA: Diagnosis present

## 2017-08-11 DIAGNOSIS — I4819 Other persistent atrial fibrillation: Secondary | ICD-10-CM

## 2017-08-11 DIAGNOSIS — Z885 Allergy status to narcotic agent status: Secondary | ICD-10-CM | POA: Diagnosis not present

## 2017-08-11 HISTORY — DX: Other persistent atrial fibrillation: I48.19

## 2017-08-11 HISTORY — DX: Other long term (current) drug therapy: Z79.899

## 2017-08-11 HISTORY — DX: Encounter for therapeutic drug level monitoring: Z51.81

## 2017-08-11 LAB — BASIC METABOLIC PANEL
Anion gap: 7 (ref 5–15)
BUN: 22 mg/dL (ref 8–23)
CO2: 28 mmol/L (ref 22–32)
Calcium: 9.6 mg/dL (ref 8.9–10.3)
Chloride: 107 mmol/L (ref 98–111)
Creatinine, Ser: 1.41 mg/dL — ABNORMAL HIGH (ref 0.61–1.24)
GFR calc Af Amer: 52 mL/min — ABNORMAL LOW (ref >60)
GFR calc non Af Amer: 45 mL/min — ABNORMAL LOW (ref >60)
Glucose, Bld: 91 mg/dL (ref 70–99)
Potassium: 4.7 mmol/L (ref 3.5–5.1)
Sodium: 142 mmol/L (ref 135–145)

## 2017-08-11 LAB — MAGNESIUM: Magnesium: 1.7 mg/dL (ref 1.7–2.4)

## 2017-08-11 LAB — POCT INR: INR: 3.6 — AB (ref 2.0–3.0)

## 2017-08-11 MED ORDER — NITROGLYCERIN 0.4 MG SL SUBL: 0.4000 mg | SUBLINGUAL_TABLET | SUBLINGUAL | Status: DC | PRN

## 2017-08-11 MED ORDER — AMOXICILLIN 500 MG PO CAPS
500.0000 mg | ORAL_CAPSULE | Freq: Two times a day (BID) | ORAL | Status: DC
  Administered 2017-08-11 – 2017-08-15 (×8): 500 mg via ORAL
  Filled 2017-08-11 (×8): qty 1

## 2017-08-11 MED ORDER — FLUTICASONE PROPIONATE 50 MCG/ACT NA SUSP: 1.0000 | Freq: Every day | NASAL | Status: DC | PRN

## 2017-08-11 MED ORDER — SODIUM CHLORIDE 0.9% FLUSH: 3.0000 mL | INTRAVENOUS | Status: DC | PRN

## 2017-08-11 MED ORDER — SODIUM CHLORIDE 0.9 % IV SOLN
250.0000 mL | INTRAVENOUS | Status: DC | PRN
  Administered 2017-08-12: 250 mL via INTRAVENOUS

## 2017-08-11 MED ORDER — EZETIMIBE 10 MG PO TABS
10.0000 mg | ORAL_TABLET | Freq: Every day | ORAL | Status: DC
  Administered 2017-08-12 – 2017-08-15 (×4): 10 mg via ORAL
  Filled 2017-08-11 (×4): qty 1

## 2017-08-11 MED ORDER — PANTOPRAZOLE SODIUM 40 MG PO TBEC
80.0000 mg | DELAYED_RELEASE_TABLET | Freq: Every day | ORAL | Status: DC
  Administered 2017-08-11 – 2017-08-15 (×5): 80 mg via ORAL
  Filled 2017-08-11 (×6): qty 2

## 2017-08-11 MED ORDER — VITAMIN B-12 1000 MCG PO TABS
1000.0000 ug | ORAL_TABLET | ORAL | Status: DC
  Administered 2017-08-15: 1000 ug via ORAL
  Filled 2017-08-11: qty 1

## 2017-08-11 MED ORDER — TAMSULOSIN HCL 0.4 MG PO CAPS
0.4000 mg | ORAL_CAPSULE | Freq: Every day | ORAL | Status: DC
  Administered 2017-08-12 – 2017-08-15 (×4): 0.4 mg via ORAL
  Filled 2017-08-11 (×4): qty 1

## 2017-08-11 MED ORDER — WARFARIN SODIUM 2.5 MG PO TABS
2.5000 mg | ORAL_TABLET | Freq: Once | ORAL | Status: AC
  Filled 2017-08-11: qty 1

## 2017-08-11 MED ORDER — ZOLPIDEM TARTRATE 5 MG PO TABS
2.5000 mg | ORAL_TABLET | Freq: Every evening | ORAL | Status: DC | PRN
  Administered 2017-08-12 – 2017-08-14 (×3): 5 mg via ORAL
  Filled 2017-08-11 (×4): qty 1

## 2017-08-11 MED ORDER — SODIUM CHLORIDE 0.9% FLUSH
3.0000 mL | Freq: Two times a day (BID) | INTRAVENOUS | Status: DC
  Administered 2017-08-12 – 2017-08-14 (×5): 3 mL via INTRAVENOUS

## 2017-08-11 MED ORDER — ROSUVASTATIN CALCIUM 10 MG PO TABS
5.0000 mg | ORAL_TABLET | Freq: Every day | ORAL | Status: DC
  Administered 2017-08-11 – 2017-08-15 (×5): 5 mg via ORAL
  Filled 2017-08-11 (×6): qty 1

## 2017-08-11 MED ORDER — METOPROLOL TARTRATE 12.5 MG HALF TABLET
12.5000 mg | ORAL_TABLET | Freq: Two times a day (BID) | ORAL | Status: DC
  Administered 2017-08-11 – 2017-08-15 (×8): 12.5 mg via ORAL
  Filled 2017-08-11 (×8): qty 1

## 2017-08-11 MED ORDER — WARFARIN - PHARMACIST DOSING INPATIENT
Freq: Every day | Status: DC
  Administered 2017-08-13 – 2017-08-14 (×2)

## 2017-08-11 MED ORDER — WARFARIN SODIUM 2.5 MG PO TABS
2.5000 mg | ORAL_TABLET | Freq: Once | ORAL | Status: AC
  Administered 2017-08-11: 2.5 mg via ORAL
  Filled 2017-08-11: qty 1

## 2017-08-11 MED ORDER — DOFETILIDE 500 MCG PO CAPS: 500.0000 ug | ORAL_CAPSULE | Freq: Two times a day (BID) | ORAL | Status: DC

## 2017-08-11 NOTE — Progress Notes (Signed)
ANTICOAGULATION CONSULT NOTE - Initial Consult  Pharmacy Consult for Coumadin Indication: atrial fibrillation  Allergies  Allergen Reactions  . Prednisone Other (See Comments)    anxiety  . Zocor [Simvastatin] Other (See Comments)    Muscle aches  . Yellow Jacket Venom [Bee Venom] Swelling    Swelling and raised bumps at site  . Hydrocodone Rash  . Oxycodone Hcl Rash    Patient Measurements: Height: 6\' 2"  (188 cm) Weight: 235 lb 12.8 oz (107 kg) IBW/kg (Calculated) : 82.2  Vital Signs: Temp: 98.5 F (36.9 C) (07/15 1541) Temp Source: Oral (07/15 1541) BP: 110/73 (07/15 1541) Pulse Rate: 62 (07/15 1541)  Labs: Recent Labs    08/11/17 1016 08/11/17 1038  INR 3.6*  --   CREATININE  --  1.41*    Estimated Creatinine Clearance: 52.6 mL/min (A) (by C-G formula based on SCr of 1.41 mg/dL (H)).   Medical History: Past Medical History:  Diagnosis Date  . Aortic stenosis    mild by echo 09/2016  . Coronary disease    2 Vessel,inferior wall MI june 2003, PTCA and Stent RCA June 2003, PTCA stent, Left Circumflex OM 02 Mar 2003, Dr Radford Pax  . Dilated aortic root ([place])    44mm by echo 09/2016  . DJD (degenerative joint disease) of knee    LEFT  . Dyslipidemia   . GERD (gastroesophageal reflux disease)   . History of rotator cuff syndrome    (Right) with biceps tendinitis  . Hypertension   . Obesity   . OSA (obstructive sleep apnea)    presented with HA and drowsiness,mild sleep apnea with oxygen desats to 78%  . Persistent atrial fibrillation ([place])   . Prostate cancer ([place])    /BPH, radioactive seed 2007, Nesi     Medications:  Medications Prior to Admission  Medication Sig Dispense Refill Last Dose  . amoxicillin (AMOXIL) 500 MG capsule Take 500 mg by mouth 2 (two) times daily.   08/11/2017 at AM  . Artificial Tear Ointment (DRY EYES OP) Apply 1 drop to eye daily as needed (dry eyes).   unk  . ezetimibe (ZETIA) 10 MG tablet Take 1 tablet (10 mg total) by mouth  daily. 90 tablet 2 08/11/2017 at Unknown time  . fluticasone (FLONASE) 50 MCG/ACT nasal spray Place 1 spray into both nostrils daily as needed for allergies or rhinitis.   unk  . metoprolol tartrate (LOPRESSOR) 25 MG tablet TAKE 1 TABLET BY MOUTH TWICE DAILY (Patient taking differently: TAKE 1/2 TABLET BY MOUTH TWICE DAILY) 180 tablet 2 08/11/2017 at 0800  . nitroGLYCERIN (NITROSTAT) 0.4 MG SL tablet Place 1 tablet (0.4 mg total) under the tongue every 5 (five) minutes as needed for chest pain. 25 tablet 5 unk  . omeprazole (PRILOSEC) 40 MG capsule Take 40 mg by mouth daily as needed (heartburn).   unk  . rosuvastatin (CRESTOR) 5 MG tablet Take 1 tablet (5 mg total) daily by mouth. (Patient taking differently: Take 5 mg by mouth daily at 6 PM. ) 90 tablet 3 08/10/2017 at Unknown time  . tamsulosin (FLOMAX) 0.4 MG CAPS capsule Take 0.4 mg by mouth daily.   08/11/2017 at Unknown time  . vitamin B-12 (CYANOCOBALAMIN) 1000 MCG tablet Take 1,000 mcg by mouth every 7 (seven) days.    08/08/2017  . warfarin (COUMADIN) 5 MG tablet Take as directed by coumadin clinic (Patient taking differently: Take 2.5-5 mg by mouth See admin instructions. Take 5mg  by mouth on Monday, Wednesday and Friday.  Tuesday, Thursday, Saturday and Sunday take 2.5mg ) 90 tablet 0 08/11/2017 at Unknown time  . zolpidem (AMBIEN) 5 MG tablet Take 2.5-5 mg by mouth at bedtime as needed for sleep.    08/10/2017 at Unknown time    Assessment: Mr. Chestnut is a 84 yo M who presents to the hospital for Tikosyn initiation.  Pt is on Coumadin PTA for his afib.  He was seen in clinic today with an INR of 3.6.  The clinic plan was to reduce tonight's Coumadin dose by half, then restart home dose [2.5mg  today (7/15) then resume 2.5mg  daily except 5mg  on MWF].  Will check daily INR.  Concerned that outpt Rx for Amoxicillin (through 7/20) could be increasing INR.  Goal of Therapy:  INR 2-3 Monitor platelets by anticoagulation protocol: Yes   Plan:   Follow clinic plan for now - Coumadin 2.5mg  PO x 1 tonight. Daily INR  [employer], Pharm.D., BCPS Clinical Pharmacist Pager: [phone] Clinical phone for 08/11/2017 is [phone].  **Pharmacist phone directory can now be found on [email or web address] ([email or web address]).  Listed under [place].  08/11/2017 5:11 PM

## 2017-08-11 NOTE — H&P (Addendum)
Cardiology Admission History and Physical:   Patient ID: Randall Chen; MRN: [id number]; DOB: May 21, 1932   Admission date: 08/11/2017  Primary Care Provider: Lavone Orn, MD Primary Cardiologist: Dr. Radford Chen Primary Electrophysiologist:  New, this admission  Chief Complaint:  Tikosyn load  Patient Profile:   Randall Chen is a 84 y.o. male with a history of CAD (prior PCI's, last 2005)m known bicuspid AV, mild AS, mild root/ascending ao dilation OSA w/CPAP, HTN, HLD, and PAFib.  He was referred to the AFib clinic for AAD/rhythm management options, given some hx of bradycardia Tiksoyn felt to be his best initial option.  History of Present Illness:   [name] comes today for Tiksoyn initiation.  He has had therapeutic weekly INRs for the last 3 weeks.  He is antibiotic regime after some bee stings he had a couple days ago.   They are doing much better with his Benadryl and Keflex, swelling is significantly better wth some mild itching remaining Last dose of benadryl was last evening   He is hoping to have better energy if able to get back into SR.  Past Medical History:  Diagnosis Date  . Aortic stenosis    mild by echo 09/2016  . Coronary disease    2 Vessel,inferior wall MI june 2003, PTCA and Stent RCA June 2003, PTCA stent, Left Circumflex OM 02 Mar 2003, Dr Randall Chen  . Dilated aortic root ([place])    78mm by echo 09/2016  . DJD (degenerative joint disease) of knee    LEFT  . Dyslipidemia   . GERD (gastroesophageal reflux disease)   . History of rotator cuff syndrome    (Right) with biceps tendinitis  . Hypertension   . Obesity   . OSA (obstructive sleep apnea)    presented with HA and drowsiness,mild sleep apnea with oxygen desats to 78%  . Persistent atrial fibrillation ([place])   . Prostate cancer ([place])    /BPH, radioactive seed 2007, Randall Chen     Past Surgical History:  Procedure Laterality Date  . CARDIAC CATHETERIZATION    . CARDIOVERSION N/A 08/27/2013   Procedure: CARDIOVERSION;  Surgeon: Randall Margarita, MD;  Location: [facility] ENDOSCOPY;  Service: Cardiovascular;  Laterality: N/A;  . CARDIOVERSION N/A 03/15/2016   Procedure: CARDIOVERSION;  Surgeon: Randall Spark, MD;  Location: [place];  Service: Cardiovascular;  Laterality: N/A;  . CARDIOVERSION N/A 10/30/2016   Procedure: CARDIOVERSION;  Surgeon: Randall Pain, MD;  Location: [facility] ENDOSCOPY;  Service: Cardiovascular;  Laterality: N/A;  . CORONARY ANGIOPLASTY    . JOINT REPLACEMENT     left knee replacement 2009  . TEE WITHOUT CARDIOVERSION N/A 10/30/2016   Procedure: TRANSESOPHAGEAL ECHOCARDIOGRAM (TEE);  Surgeon: Randall Pain, MD;  Location: [facility] ENDOSCOPY;  Service: Cardiovascular;  Laterality: N/A;     Medications Prior to Admission: Prior to Admission medications   Medication Sig Start Date End Date Taking? Authorizing Provider  amoxicillin (AMOXIL) 500 MG capsule Take 500 mg by mouth 2 (two) times daily. 08/10/17 08/14/17  [provider]  ezetimibe (ZETIA) 10 MG tablet Take 1 tablet (10 mg total) by mouth daily. 02/07/17   Randall Margarita, MD  fluticasone (FLONASE) 50 MCG/ACT nasal spray Place 1 spray into both nostrils daily as needed for allergies or rhinitis.    [provider]  metoprolol tartrate (LOPRESSOR) 25 MG tablet TAKE 1 TABLET BY MOUTH TWICE DAILY Patient taking differently: TAKE 1/2 TABLET BY MOUTH TWICE DAILY 02/21/17   Randall Margarita, MD  nitroGLYCERIN (NITROSTAT) 0.4 MG SL tablet Place 1 tablet (0.4 mg total) under the tongue every 5 (five) minutes as needed for chest Chen. 12/17/16   Randall Margarita, MD  omeprazole (PRILOSEC) 40 MG capsule Take 40 mg by mouth daily as needed (heartburn).    [provider]  rosuvastatin (CRESTOR) 5 MG tablet Take 1 tablet (5 mg total) daily by mouth. 12/06/16   Randall Margarita, MD  tamsulosin (FLOMAX) 0.4 MG CAPS capsule Take 0.4 mg by mouth daily.    [provider]  vitamin B-12 (CYANOCOBALAMIN)  500 MCG tablet Take 500 mcg by mouth daily.     [provider]  warfarin (COUMADIN) 5 MG tablet Take as directed by coumadin clinic Patient taking differently: Take 2.5-5 mg by mouth See admin instructions. Take 5mg  by mouth on Monday, Wednesday and Friday. Tuesday, Thursday, Saturday and Sunday take 2.5mg  05/09/17   Randall Margarita, MD  zolpidem (AMBIEN) 5 MG tablet Take 2.5-5 mg by mouth at bedtime as needed for sleep.     [provider]     Allergies:    Allergies  Allergen Reactions  . Prednisone Other (See Comments)    anxiety  . Zocor [Simvastatin] Other (See Comments)    Muscle aches  . Oxycodone Hcl Rash    Social History:   Social History   Socioeconomic History  . Marital status: Married    Spouse name: Not on file  . Number of children: Not on file  . Years of education: Not on file  . Highest education level: Not on file  Occupational History  . Not on file  Social Needs  . Financial resource strain: Not on file  . Food insecurity:    Worry: Not on file    Inability: Not on file  . Transportation needs:    Medical: Not on file    Non-medical: Not on file  Tobacco Use  . Smoking status: Former Smoker    Last attempt to quit: 01/28/1958    Years since quitting: 59.5  . Smokeless tobacco: Never Used  Substance and Sexual Activity  . Alcohol use: Yes    Comment: "a few a week"  . Drug use: No  . Sexual activity: Not on file  Lifestyle  . Physical activity:    Days per week: Not on file    Minutes per session: Not on file  . Stress: Not on file  Relationships  . Social connections:    Talks on phone: Not on file    Gets together: Not on file    Attends religious service: Not on file    Active member of club or organization: Not on file    Attends meetings of clubs or organizations: Not on file    Relationship status: Not on file  . Intimate partner violence:    Fear of current or ex partner: Not on file    Emotionally abused: Not on  file    Physically abused: Not on file    Forced sexual activity: Not on file  Other Topics Concern  . Not on file  Social History Narrative  . Not on file    Family History:   The patient's family history includes Cancer in his brother; Coronary artery disease in his brother and brother; Diabetes in his mother; Fainting in his brother; Heart attack in his brother and father; Liver cancer in his brother; Peripheral vascular disease in his father; Sudden death in his brother.  ROS:  Please see the history of present illness.  All other ROS reviewed and negative.     Physical Exam/Data:   Vitals:   08/11/17 1530 08/11/17 1541  BP:  110/73  Pulse:  62  Temp:  98.5 F (36.9 C)  TempSrc: Oral Oral  SpO2:  95%  Weight: 235 lb 12.8 oz (107 kg)   Height: 6\' 2"  (1.88 m)    No intake or output data in the 24 hours ending 08/11/17 1601 Filed Weights   08/11/17 1530  Weight: 235 lb 12.8 oz (107 kg)   Body mass index is 30.27 kg/m.  General:  Well nourished, well developed, in no acute distress HEENT: normal Lymph: no adenopathy Neck: no JVD Endocrine:  No thryomegaly Vascular: No carotid bruits Cardiac:  irreg-irreg, bradycardic; no murmurs, gallops or rubs Lungs:  CTA b/l, no wheezing, rhonchi or rales  Abd: soft, nontender  Ext: no edema Musculoskeletal:  No deformities, BUE and BLE strength normal and equal Skin: warm and dry, some mild erythema and firmness at bee sting site (R knee), no obvious swelling   Neuro:  no gross focal abnormalities noted Psych:  Normal affect    EKG:  The ECG that was done today was personally reviewed and demonstrates  Afib 54bpm, QTc 403  Relevant CV Studies:  10/09/16: TTE Study Conclusions - Left ventricle: Wall thickness was increased in a pattern of   severe LVH. The estimated ejection fraction was 55%. - Aortic valve: MIld AS gradients stable since September 2017.   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 Valve area (VTI):    2.92 cm^2. Valve area (Vmax): 2.71 cm^2. Valve area (Vmean): 2.56   cm^2. - Left atrium: The atrium was moderately dilated. - Right ventricle: The cavity size was mildly dilated. - Atrial septum: No defect or patent foramen ovale was identified.  (LA 45mm)  Laboratory Data:  Chemistry Recent Labs  Lab 08/11/17 1038  NA 142  K 4.7  CL 107  CO2 28  GLUCOSE 91  BUN 22  CREATININE 1.41*  CALCIUM 9.6  GFRNONAA 45*  GFRAA 52*  ANIONGAP 7    No results for input(s): PROT, ALBUMIN, AST, ALT, ALKPHOS, BILITOT in the last 168 hours. HematologyNo results for input(s): WBC, RBC, HGB, HCT, MCV, MCH, MCHC, RDW, PLT in the last 168 hours. Cardiac EnzymesNo results for input(s): TROPONINI in the last 168 hours. No results for input(s): TROPIPOC in the last 168 hours.  BNPNo results for input(s): BNP, PROBNP in the last 168 hours.  DDimer No results for input(s): DDIMER in the last 168 hours.  Radiology/Studies:  No results found.  Assessment and Plan:   1. Persistent Afib     CHA2DS2Vasc is 4, on warfarin     Tikosyn load     K+ 4.7     Mag 1.7, was given 400mg  vis the AFib office for supplementation     Creat 1.41 (Calc CrCl is 61)     QT is OK to start, will plan for 574mcg, watch renal function     Pharmacy for warfarin  DCCV Wed if not in SR Discussed with pharmacy, recommendation is 48 hours after last dose of benadryl (2300 yesterday evening) to start [facility] Will plan then for tomorrow morning, I have discussed this with the patient, will discuss with Dr. Lovena Le further  2. HTN     Continue home meds  3. CAD     No anginal complaints  Continue home meds    For questions or updates, please contact [place] Please consult [email or web address] for contact info under Cardiology/STEMI.    Signed, Baldwin Jamaica, PA-C  08/11/2017 4:01 PM   EP Attending  Patient seen and examined. Agree with above. The patient presents for initiation of dofetilide. He took  benadryl earlier this morning. We will wait a total of 24 hours and start his dofetilide at 0700 in the morning. The benadryl will be out of his system in 36-48. We will follow his QT very carefully. Keep electrolytes replete.   Mikle Bosworth.D.

## 2017-08-11 NOTE — Progress Notes (Signed)
Primary Care Physician: Lavone Orn, MD Primary Cardiologist: Brandell Maready is a 84 y.o. male with a history of persistent atrial fibrillation who presents for follow up in the [facility].  Since last being seen in clinic, the patient reports doing reasonably well. He continues with exercise intolerance and fatigue.  He was stung by yellow jackets this weekend while watering flowers and is currently on amoxicillin.  Today, he  denies symptoms of palpitations, chest pain, shortness of breath, orthopnea, PND, lower extremity edema, dizziness, presyncope, syncope, snoring, daytime somnolence, bleeding, or neurologic sequela. The patient is tolerating medications without difficulties and is otherwise without complaint today.    Atrial Fibrillation Risk Factors:  he does have symptoms or diagnosis of sleep apnea. he is compliant with CPAP therapy.  he does not have a history of rheumatic fever.  he does not have a history of alcohol use.  he has a BMI of Body mass index is 30.27 kg/m.Marland Kitchen Filed Weights   08/11/17 1044  Weight: 235 lb 12.8 oz (107 kg)    LA size: 46   Atrial Fibrillation Management history:  Previous antiarrhythmic drugs: none  Previous cardioversions: 07/2013, 02/2016, 10/2016  Previous ablations: none  CHADS2VASC score: 4  Anticoagulation history: Warfarin   Past Medical History:  Diagnosis Date  . Aortic stenosis    mild by echo 09/2016  . Coronary disease    2 Vessel,inferior wall MI june 2003, PTCA and Stent RCA June 2003, PTCA stent, Left Circumflex OM 02 Mar 2003, Dr Radford Pax  . Dilated aortic root ([place])    69mm by echo 09/2016  . DJD (degenerative joint disease) of knee    LEFT  . Dyslipidemia   . GERD (gastroesophageal reflux disease)   . History of rotator cuff syndrome    (Right) with biceps tendinitis  . Hypertension   . Obesity   . OSA (obstructive sleep apnea)    presented with HA and drowsiness,mild  sleep apnea with oxygen desats to 78%  . Persistent atrial fibrillation ([place])   . Prostate cancer ([place])    /BPH, radioactive seed 2007, Nesi    Past Surgical History:  Procedure Laterality Date  . CARDIAC CATHETERIZATION    . CARDIOVERSION N/A 08/27/2013   Procedure: CARDIOVERSION;  Surgeon: Sueanne Margarita, MD;  Location[facility] ENDOSCOPY;  Service: Cardiovascular;  Laterality: N/A;  . CARDIOVERSION N/A 03/15/2016   Procedure: CARDIOVERSION;  Surgeon: Dorothy Spark, MD;  Location: [place];  Service: Cardiovascular;  Laterality: N/A;  . CARDIOVERSION N/A 10/30/2016   Procedure: CARDIOVERSION;  Surgeon: Jerline Pain, MD;  Location: [facility] ENDOSCOPY;  Service: Cardiovascular;  Laterality: N/A;  . CORONARY ANGIOPLASTY    . JOINT REPLACEMENT     left knee replacement 2009  . TEE WITHOUT CARDIOVERSION N/A 10/30/2016   Procedure: TRANSESOPHAGEAL ECHOCARDIOGRAM (TEE);  Surgeon: Jerline Pain, MD;  Location: [facility] ENDOSCOPY;  Service: Cardiovascular;  Laterality: N/A;    Current Outpatient Medications  Medication Sig Dispense Refill  . amoxicillin (AMOXIL) 500 MG capsule Take 500 mg by mouth 2 (two) times daily.    Marland Kitchen ezetimibe (ZETIA) 10 MG tablet Take 1 tablet (10 mg total) by mouth daily. 90 tablet 2  . fluticasone (FLONASE) 50 MCG/ACT nasal spray Place 1 spray into both nostrils daily as needed for allergies or rhinitis.    . metoprolol tartrate (LOPRESSOR) 25 MG tablet TAKE 1 TABLET BY MOUTH TWICE DAILY (Patient taking differently: TAKE 1/2 TABLET  BY MOUTH TWICE DAILY) 180 tablet 2  . omeprazole (PRILOSEC) 40 MG capsule Take 40 mg by mouth daily as needed (heartburn).    . rosuvastatin (CRESTOR) 5 MG tablet Take 1 tablet (5 mg total) daily by mouth. 90 tablet 3  . tamsulosin (FLOMAX) 0.4 MG CAPS capsule Take 0.4 mg by mouth daily.    . vitamin B-12 (CYANOCOBALAMIN) 500 MCG tablet Take 500 mcg by mouth daily.     Marland Kitchen warfarin (COUMADIN) 5 MG tablet Take as directed by coumadin clinic (Patient  taking differently: Take 2.5-5 mg by mouth See admin instructions. Take 5mg  by mouth on Monday, Wednesday and Friday. Tuesday, Thursday, Saturday and Sunday take 2.5mg ) 90 tablet 0  . zolpidem (AMBIEN) 5 MG tablet Take 2.5-5 mg by mouth at bedtime as needed for sleep.     . nitroGLYCERIN (NITROSTAT) 0.4 MG SL tablet Place 1 tablet (0.4 mg total) under the tongue every 5 (five) minutes as needed for chest pain. 25 tablet 5   No current facility-administered medications for this encounter.    Facility-Administered Medications Ordered in Other Encounters  Medication Dose Route Frequency Provider Last Rate Last Dose  . 0.9 %  sodium chloride infusion    Continuous PRN Lowella Dell, CRNA        Allergies  Allergen Reactions  . Prednisone Other (See Comments)    anxiety  . Zocor [Simvastatin] Other (See Comments)    Muscle aches  . Oxycodone Hcl Rash    Social History   Socioeconomic History  . Marital status: Married    Spouse name: Not on file  . Number of children: Not on file  . Years of education: Not on file  . Highest education level: Not on file  Occupational History  . Not on file  Social Needs  . Financial resource strain: Not on file  . Food insecurity:    Worry: Not on file    Inability: Not on file  . Transportation needs:    Medical: Not on file    Non-medical: Not on file  Tobacco Use  . Smoking status: Former Smoker    Last attempt to quit: 01/28/1958    Years since quitting: 59.5  . Smokeless tobacco: Never Used  Substance and Sexual Activity  . Alcohol use: Yes    Comment: "a few a week"  . Drug use: No  . Sexual activity: Not on file  Lifestyle  . Physical activity:    Days per week: Not on file    Minutes per session: Not on file  . Stress: Not on file  Relationships  . Social connections:    Talks on phone: Not on file    Gets together: Not on file    Attends religious service: Not on file    Active member of club or organization: Not on  file    Attends meetings of clubs or organizations: Not on file    Relationship status: Not on file  . Intimate partner violence:    Fear of current or ex partner: Not on file    Emotionally abused: Not on file    Physically abused: Not on file    Forced sexual activity: Not on file  Other Topics Concern  . Not on file  Social History Narrative  . Not on file    Family History  Problem Relation Age of Onset  . Diabetes Mother   . Peripheral vascular disease Father   . Heart attack Father   .  Coronary artery disease Brother   . Liver cancer Brother   . Coronary artery disease Brother   . Heart attack Brother   . Cancer Brother   . Sudden death Brother   . Fainting Brother     ROS- All systems are reviewed and negative except as per the HPI above.  Physical Exam: Vitals:   08/11/17 1044  BP: 118/70  Pulse: (!) 54  Weight: 235 lb 12.8 oz (107 kg)  Height: 6\' 2"  (1.88 m)    GEN- The patient is well appearing, alert and oriented x 3 today.   Head- normocephalic, atraumatic Eyes-  Sclera clear, conjunctiva pink Ears- hearing intact Oropharynx- clear Neck- supple  Lungs- Clear to ausculation bilaterally, normal work of breathing Heart- Bradycardic irregular rate and rhythm, no murmurs, rubs or gallops  GI- soft, NT, ND, + BS Extremities- no clubbing, cyanosis, or edema MS- no significant deformity or atrophy Skin- no rash or lesion Psych- euthymic mood, full affect Neuro- strength and sensation are intact  Wt Readings from Last 3 Encounters:  08/11/17 235 lb 12.8 oz (107 kg)  07/21/17 234 lb 12.8 oz (106.5 kg)  03/17/17 234 lb (106.1 kg)    EKG today demonstrates atrial fibrillation, rate 54, QTc 482msec Echo 09/2016 demonstrated EF 55%, severe LVH, mild AS, LA moderately dilated  Epic records are reviewed at length today  Assessment and Plan:  1. Persistent atrial fibrillation Plan for Tikosyn load today QTc 448msec. BMET, Mg drawn Continue Warfarin for  CHADS2VASC of 4. INR's therapeutic for the last 4 weeks  2. Overweight Body mass index is 30.27 kg/m. Weight loss recommended  3. Obstructive sleep apnea The patient reports compliance with CPAP   Follow up with AF clinic after Tikosyn load   Chanetta Marshall, NP 08/11/2017 11:04 AM

## 2017-08-11 NOTE — Patient Instructions (Signed)
Description   Today July 15th take 1/2 tablet (2.5mg ) then continue on same dosage of coumadin  1/2 tablet daily except 1 tablet on Mondays, Wednesdays and Fridays. Recheck INR in 1 week after cardioversion. Call us with any medication changes or concerns to # [phone] Coumadin Clinic.

## 2017-08-11 NOTE — Progress Notes (Signed)
Pharmacy Review for Dofetilide (Tikosyn) Initiation  Admit Complaint: 84 y.o. male admitted 08/11/2017 with atrial fibrillation to be initiated on dofetilide.   Assessment:  Patient Exclusion Criteria: If any screening criteria checked as "Yes", then  patient  should NOT receive dofetilide until criteria item is corrected. If "Yes" please indicate correction plan.  TIKOSYN INITIATION DELAYED UNTIL 7/16 PM.  SEE INFO BELOW.   YES  NO Patient  Exclusion Criteria Correction Plan  []  [x]  Baseline QTc interval is greater than or equal to 440 msec. IF above YES box checked dofetilide contraindicated unless patient has ICD; then may proceed if QTc 500-550 msec or with known ventricular conduction abnormalities may proceed with QTc 550-600 msec. QTc =  403 on EKG From 7/15   [x]  []  Magnesium level is less than 1.8 mEq/l : Last magnesium:  Lab Results  Component Value Date   MG 1.7 08/11/2017       Paged NP for replacement  []  [x]  Potassium level is less than 4 mEq/l : Last potassium:  Lab Results  Component Value Date   K 4.7 08/11/2017         []  [x]  Patient is known or suspected to have a digoxin level greater than 2 ng/ml: No results found for: DIGOXIN    []  [x]  Creatinine clearance less than 20 ml/min (calculated using Cockcroft-Gault, actual body weight and serum creatinine): Estimated Creatinine Clearance: 52.6 mL/min (A) (by C-G formula based on SCr of 1.41 mg/dL (H)). CrCl 61 using TBW in formula.   [x]  []  Patient has received drugs known to prolong the QT intervals within the last 48 hours (phenothiazines, tricyclics or tetracyclic antidepressants, erythromycin, H-1 antihistamines, cisapride, fluoroquinolones, azithromycin). Drugs not listed above may have an, as yet, undetected potential to prolong the QT interval, updated information on QT prolonging agents is available at this website:QT prolonging agents Benadryl 7/14 PM.  NP aware. Delay Tikosyn initiation until 7/16 PM.   []  [x]  Patient received a dose of hydrochlorothiazide (Oretic) alone or in any combination including triamterene (Dyazide, Maxzide) in the last 48 hours.   []  [x]  Patient received a medication known to increase dofetilide plasma concentrations prior to initial dofetilide dose:  . Trimethoprim (Primsol, Proloprim) in the last 36 hours . Verapamil (Calan, Verelan) in the last 36 hours or a sustained release dose in the last 72 hours . Megestrol (Megace) in the last 5 days  . Cimetidine (Tagamet) in the last 6 hours . Ketoconazole (Nizoral) in the last 24 hours . Itraconazole (Sporanox) in the last 48 hours  . Prochlorperazine (Compazine) in the last 36 hours    []  [x]  Patient is known to have a history of torsades de pointes; congenital or acquired long QT syndromes.   []  [x]  Patient has received a Class 1 antiarrhythmic with less than 2 half-lives since last dose. (Disopyramide, Quinidine, Procainamide, Lidocaine, Mexiletine, Flecainide, Propafenone)   []  [x]  Patient has received amiodarone therapy in the past 3 months or amiodarone level is greater than 0.3 ng/ml.    Patient has been appropriately anticoagulated with Warfarin.  INR has been therapeutic over the last 4 weeks per NP notes.    Goal of Therapy: Follow renal function, electrolytes, potential drug interactions, and dose adjustment. Provide education and 1 week supply at discharge.  Plan:  [x]   Physician selected initial dose within range recommended for patients level of renal function - will monitor for response.  []   Physician selected initial dose outside of range recommended for patients  level of renal function - will discuss if the dose should be altered at this time.   Select One Calculated CrCl  Dose q12h  [x]  > 60 ml/min 500 mcg  []  40-60 ml/min 250 mcg  []  20-40 ml/min 125 mcg   2. Follow up QTc after the first 5 doses, renal function, electrolytes (K & Mg) daily x 3 days, dose adjustment, success of initiation and  facilitate 1 week discharge supply as clinically indicated.  3. Initiate Tikosyn education video (Call [phone] and ask for video # 116).    Elmer Ramp 4:47 PM 08/11/2017

## 2017-08-12 ENCOUNTER — Other Ambulatory Visit: Payer: Self-pay

## 2017-08-12 ENCOUNTER — Encounter (HOSPITAL_COMMUNITY): Payer: Self-pay | Admitting: General Practice

## 2017-08-12 DIAGNOSIS — Z79899 Other long term (current) drug therapy: Secondary | ICD-10-CM

## 2017-08-12 DIAGNOSIS — Z5181 Encounter for therapeutic drug level monitoring: Secondary | ICD-10-CM

## 2017-08-12 HISTORY — DX: Encounter for therapeutic drug level monitoring: Z51.81

## 2017-08-12 HISTORY — DX: Other long term (current) drug therapy: Z79.899

## 2017-08-12 LAB — BASIC METABOLIC PANEL
Anion gap: 8 (ref 5–15)
BUN: 27 mg/dL — ABNORMAL HIGH (ref 8–23)
CO2: 25 mmol/L (ref 22–32)
Calcium: 9.4 mg/dL (ref 8.9–10.3)
Chloride: 109 mmol/L (ref 98–111)
Creatinine, Ser: 1.31 mg/dL — ABNORMAL HIGH (ref 0.61–1.24)
GFR calc Af Amer: 57 mL/min — ABNORMAL LOW (ref >60)
GFR calc non Af Amer: 49 mL/min — ABNORMAL LOW (ref >60)
Glucose, Bld: 115 mg/dL — ABNORMAL HIGH (ref 70–99)
Potassium: 4.7 mmol/L (ref 3.5–5.1)
Sodium: 142 mmol/L (ref 135–145)

## 2017-08-12 LAB — PROTIME-INR
INR: 2.62
Prothrombin Time: 27.8 seconds — ABNORMAL HIGH (ref 11.4–15.2)

## 2017-08-12 LAB — MAGNESIUM: Magnesium: 1.8 mg/dL (ref 1.7–2.4)

## 2017-08-12 MED ORDER — DOFETILIDE 500 MCG PO CAPS
500.0000 ug | ORAL_CAPSULE | Freq: Two times a day (BID) | ORAL | Status: DC
  Administered 2017-08-12 – 2017-08-14 (×5): 500 ug via ORAL
  Filled 2017-08-12 (×5): qty 1

## 2017-08-12 MED ORDER — MAGNESIUM SULFATE IN D5W 1-5 GM/100ML-% IV SOLN
1.0000 g | Freq: Once | INTRAVENOUS | Status: AC
  Administered 2017-08-12: 1 g via INTRAVENOUS
  Filled 2017-08-12: qty 100

## 2017-08-12 MED ORDER — ACETAMINOPHEN 325 MG PO TABS
650.0000 mg | ORAL_TABLET | Freq: Four times a day (QID) | ORAL | Status: DC | PRN
  Administered 2017-08-12: 650 mg via ORAL
  Filled 2017-08-12: qty 2

## 2017-08-12 MED ORDER — WARFARIN SODIUM 2.5 MG PO TABS
2.5000 mg | ORAL_TABLET | Freq: Once | ORAL | Status: AC
  Administered 2017-08-12: 2.5 mg via ORAL
  Filled 2017-08-12: qty 1

## 2017-08-12 NOTE — Progress Notes (Signed)
ANTICOAGULATION CONSULT NOTE - Follow Up Consult  Pharmacy Consult for Coumadin Indication: atrial fibrillation  Allergies  Allergen Reactions  . Prednisone Other (See Comments)    anxiety  . Zocor [Simvastatin] Other (See Comments)    Muscle aches  . Yellow Jacket Venom [Bee Venom] Swelling    Swelling and raised bumps at site  . Hydrocodone Rash  . Oxycodone Hcl Rash    Patient Measurements: Height: 6\' 2"  (188 cm) Weight: 232 lb 12.8 oz (105.6 kg) IBW/kg (Calculated) : 82.2  Vital Signs: Temp: 97.9 F (36.6 C) (07/16 0356) Temp Source: Oral (07/16 0356) BP: 125/79 (07/16 0959) Pulse Rate: 63 (07/16 0959)  Labs: Recent Labs    08/11/17 1016 08/11/17 1038 08/12/17 0606  LABPROT  --   --  27.8*  INR 3.6*  --  2.62  CREATININE  --  1.41* 1.31*    Estimated Creatinine Clearance: 56.3 mL/min (A) (by C-G formula based on SCr of 1.31 mg/dL (H)).   Medications:  Coumadin PTA  Assessment: Mr. Azucena is a 84 yo M who presents to the hospital for Tikosyn initiation.  Pt is on Coumadin PTA for his afib.  He was seen in clinic yesterday with an INR of 3.6.    The Coumadin clinic plan was to reduce tonight's Coumadin dose by half, then restart home dose [2.5mg  today (7/15) then resume 2.5mg  daily except 5mg  on MWF].  INR today down to 2.62 - after receiving 2.5mg  yesterday despite elevated INR.  Goal of Therapy:  INR 2-3 Monitor platelets by anticoagulation protocol: Yes   Plan:  Coumadin 2.5mg  po x1 tonight.  Daily PT/INR  Sloan Leiter, PharmD, BCPS, BCCCP Clinical Pharmacist Clinical phone 08/12/2017 until 3:30PM- #[phone] Please refer to [facility] after hours under [place] for clinical phone.  08/12/2017,12:26 PM

## 2017-08-12 NOTE — Care Management Note (Addendum)
Case Management Note  Patient Details  Name: Randall Chen MRN: [id number] Date of Birth: 1932/07/06   Subjective/Objective:  Pt presented for Tikosyn Load: Persistent Atrial Fib. Benefits check for Tikosyn in process- CM will make patient aware of cost once completed.                  Action/Plan: CM will assist with the Rx for the 14 day supply no refills via [place] and pt will need an additional Rx with refills. CM will monitor for additional disposition needs.    Expected Discharge Date:                  Expected Discharge Plan:  Home/Self Care  In-House Referral:  NA  Discharge planning Services  CM Consult, Medication Assistance  Post Acute Care Choice:  NA Choice offered to:  NA  DME Arranged:  N/A DME Agency:  NA  HH Arranged:  NA HH Agency:  NA  Status of Service:  Completed, signed off  If discussed at [place] of Stay Meetings, dates discussed:    Additional Comments: 1558 08-13-17 Jacqlyn Krauss, RN,BSN [phone] CM did speak with pt in regards to co pay. Pt uses [facility]- can order medication. Pt would like 90 day Rx via Mail Order.    Dofetilide (TIKOSYN) capsule 500 mcg, 250 mcg, 125 mcg  Frequency: 2 times daily is covered and no prior auth is required for both mail order and pharmacy the co pay would be for a 90 day supply 180.00 and for a 30 day supply 90.00  Bethena Roys, RN 08/12/2017, 12:11 PM

## 2017-08-12 NOTE — Progress Notes (Signed)
Patient resting in bed with no S/S of distress oe discomfort, VSS, [facility] called re: 2 second pause, will continue to monitor.

## 2017-08-12 NOTE — Progress Notes (Addendum)
Progress Note  Patient Name: Randall Chen Date of Encounter: 08/12/2017  Primary Cardiologist: Dr. Radford Pax  Subjective   Difficult to sleep in the hospital, otherwise no complaints  Inpatient Medications    Scheduled Meds: . amoxicillin  500 mg Oral BID  . ezetimibe  10 mg Oral Daily  . metoprolol tartrate  12.5 mg Oral BID  . pantoprazole  80 mg Oral Daily  . rosuvastatin  5 mg Oral Daily  . sodium chloride flush  3 mL Intravenous Q12H  . tamsulosin  0.4 mg Oral Daily  . [START ON 08/15/2017] vitamin B-12  1,000 mcg Oral Q Fri  . Warfarin - Pharmacist Dosing Inpatient   Does not apply q1800   Continuous Infusions: . sodium chloride     PRN Meds: sodium chloride, fluticasone, nitroGLYCERIN, sodium chloride flush, zolpidem   Vital Signs    Vitals:   08/11/17 1541 08/11/17 2023 08/11/17 2229 08/12/17 0356  BP: 110/73 134/78 (!) 158/82 (!) 98/57  Pulse: 62 61 60 (!) 58  Resp:  16  16  Temp: 98.5 F (36.9 C) (!) 97.5 F (36.4 C)  97.9 F (36.6 C)  TempSrc: Oral   Oral  SpO2: 95% 95%  93%  Weight:    232 lb 12.8 oz (105.6 kg)  Height:        Intake/Output Summary (Last 24 hours) at 08/12/2017 0753 Last data filed at 08/11/2017 2230 Gross per 24 hour  Intake 120 ml  Output -  Net 120 ml   Filed Weights   08/11/17 1530 08/12/17 0356  Weight: 235 lb 12.8 oz (107 kg) 232 lb 12.8 oz (105.6 kg)    Telemetry    AFib, 40-50's overninght, 50-60's daytime - Personally Reviewed  ECG    No new EKG, yesterday's Afib 54bpm, QTc 403  - Personally Reviewed/with Dr. Lovena Le  Physical Exam   GEN: No acute distress.   Neck: No JVD Cardiac: irreg-irreg, no murmurs, rubs, or gallops.  Respiratory: CTA b/l. GI: Soft, nontender, non-distended  MS: No edema; No deformity. Neuro:  Nonfocal  Psych: Normal affect   Labs    Chemistry Recent Labs  Lab 08/11/17 1038 08/12/17 0606  NA 142 142  K 4.7 4.7  CL 107 109  CO2 28 25  GLUCOSE 91 115*  BUN 22 27*    CREATININE 1.41* 1.31*  CALCIUM 9.6 9.4  GFRNONAA 45* 49*  GFRAA 52* 57*  ANIONGAP 7 8     HematologyNo results for input(s): WBC, RBC, HGB, HCT, MCV, MCH, MCHC, RDW, PLT in the last 168 hours.  Cardiac EnzymesNo results for input(s): TROPONINI in the last 168 hours. No results for input(s): TROPIPOC in the last 168 hours.   BNPNo results for input(s): BNP, PROBNP in the last 168 hours.   DDimer No results for input(s): DDIMER in the last 168 hours.   Radiology    No results found.  Cardiac Studies   10/09/16: TTE Study Conclusions - Left ventricle: Wall thickness was increased in a pattern of severe LVH. The estimated ejection fraction was 55%. - Aortic valve: MIld AS gradients stable since September 2017. 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 Valve area (VTI): 2.92 cm^2. Valve area (Vmax): 2.71 cm^2. Valve area (Vmean): 2.56 cm^2. - Left atrium: The atrium was moderately dilated. - Right ventricle: The cavity size was mildly dilated. - Atrial septum: No defect or patent foramen ovale was identified.  (LA 19mm)  Patient Profile     84  y.o. male with a history of CAD (prior PCI's, last 2005)m known bicuspid AV, mild AS, mild root/ascending ao dilation OSA w/CPAP, HTN, HLD, and PAFib, admitted for Tikosyn load  Assessment & Plan    1. Persistent Afib     CHA2DS2Vasc is 4, on warfarin     Tikosyn load     K+ 4.7     Mag 1.8,will replace, OK to start     Creat 1.31 (Calc CrCl is 65)     QT (yesterday's EKG) OK to start     INR 2.62, Pharmacy for warfarin  Unfortunately the patient had been taking Benadryl for his bee stings, last dose was around 10-11PM on Sunday evening Reviewed with Dr. Lovena Le, initiation held, and will start this AM given he is here and being monitored, discussed with the patient.  DCCV will move to Thurs. if not in SR   2. HTN     Continue home meds  3. CAD     No anginal complaints     Continue home meds    For  questions or updates, please contact [place] Please consult [email or web address] for contact info under Cardiology/STEMI.      Signed, Baldwin Jamaica, PA-C  08/12/2017, 7:53 AM    EP Attending  Patient seen and examined. Agree with above. The patient is doing well today. He will be started on dofetilide and followed closely. We will keep electolytes replete.  Mikle Bosworth.D.

## 2017-08-13 ENCOUNTER — Ambulatory Visit (HOSPITAL_COMMUNITY): Admission: RE | Admit: 2017-08-13 | Payer: PPO | Source: Ambulatory Visit | Admitting: Cardiology

## 2017-08-13 LAB — BASIC METABOLIC PANEL
Anion gap: 5 (ref 5–15)
BUN: 23 mg/dL (ref 8–23)
CO2: 28 mmol/L (ref 22–32)
Calcium: 9.1 mg/dL (ref 8.9–10.3)
Chloride: 109 mmol/L (ref 98–111)
Creatinine, Ser: 1.29 mg/dL — ABNORMAL HIGH (ref 0.61–1.24)
GFR calc Af Amer: 58 mL/min — ABNORMAL LOW (ref >60)
GFR calc non Af Amer: 50 mL/min — ABNORMAL LOW (ref >60)
Glucose, Bld: 107 mg/dL — ABNORMAL HIGH (ref 70–99)
Potassium: 4.8 mmol/L (ref 3.5–5.1)
Sodium: 142 mmol/L (ref 135–145)

## 2017-08-13 LAB — PROTIME-INR
INR: 2.86
Prothrombin Time: 29.7 seconds — ABNORMAL HIGH (ref 11.4–15.2)

## 2017-08-13 LAB — MAGNESIUM: Magnesium: 2 mg/dL (ref 1.7–2.4)

## 2017-08-13 MED ORDER — SODIUM CHLORIDE 0.9 % IV SOLN
250.0000 mL | INTRAVENOUS | Status: DC
Start: 2017-08-13 — End: 2017-08-15

## 2017-08-13 MED ORDER — SODIUM CHLORIDE 0.9% FLUSH
3.0000 mL | Freq: Two times a day (BID) | INTRAVENOUS | Status: DC
  Administered 2017-08-13 – 2017-08-14 (×2): 3 mL via INTRAVENOUS

## 2017-08-13 MED ORDER — WARFARIN SODIUM 5 MG PO TABS
5.0000 mg | ORAL_TABLET | Freq: Once | ORAL | Status: AC
  Administered 2017-08-13: 5 mg via ORAL
  Filled 2017-08-13: qty 1

## 2017-08-13 MED ORDER — SODIUM CHLORIDE 0.9% FLUSH: 3.0000 mL | INTRAVENOUS | Status: DC | PRN

## 2017-08-13 NOTE — Progress Notes (Signed)
ANTICOAGULATION CONSULT NOTE - Follow Up Consult  Pharmacy Consult for Coumadin Indication: atrial fibrillation  Allergies  Allergen Reactions  . Prednisone Other (See Comments)    anxiety  . Zocor [Simvastatin] Other (See Comments)    Muscle aches  . Yellow Jacket Venom [Bee Venom] Swelling    Swelling and raised bumps at site  . Hydrocodone Rash  . Oxycodone Hcl Rash    Patient Measurements: Height: 6\' 2"  (188 cm) Weight: 231 lb 6.4 oz (105 kg) IBW/kg (Calculated) : 82.2  Vital Signs: Temp: 97.9 F (36.6 C) (07/17 0457) Temp Source: Oral (07/17 0457) BP: 119/76 (07/17 0457) Pulse Rate: 87 (07/17 0457)  Labs: Recent Labs    08/11/17 1016 08/11/17 1038 08/12/17 0606 08/13/17 0423  LABPROT  --   --  27.8* 29.7*  INR 3.6*  --  2.62 2.86  CREATININE  --  1.41* 1.31* 1.29*    Estimated Creatinine Clearance: 57 mL/min (A) (by C-G formula based on SCr of 1.29 mg/dL (H)).   Medications:  Coumadin PTA  Assessment: Randall Chen is a 84 yo M who presents to the hospital for Tikosyn initiation.  Pt is on Coumadin PTA for his afib.  He was seen in clinic 7/15 with an INR of 3.6.    The Coumadin clinic plan was to reduce Coumadin dose by half that night, then restart home dose [2.5mg  (7/15) then resume 2.5mg  daily except 5mg  on MWF].   Goal of Therapy:  INR 2-3 Monitor platelets by anticoagulation protocol: Yes   Plan:  Coumadin 5mg  po x1 tonight.  Daily PT/INR  Randall Chen, PharmD PGY1 Pharmacy Resident Phone [phone] 08/13/2017       9:25 AM

## 2017-08-13 NOTE — Plan of Care (Signed)
  Problem: Education: Goal: Knowledge of General Education information will improve Outcome: Progressing   Problem: Health Behavior/Discharge Planning: Goal: Ability to manage health-related needs will improve Outcome: Progressing   Problem: Clinical Measurements: Goal: Ability to maintain clinical measurements within normal limits will improve Outcome: Progressing Goal: Will remain free from infection Outcome: Progressing Goal: Diagnostic test results will improve Outcome: Progressing Goal: Respiratory complications will improve Outcome: Progressing Goal: Cardiovascular complication will be avoided Outcome: Progressing   Problem: Activity: Goal: Risk for activity intolerance will decrease Outcome: Progressing   Problem: Nutrition: Goal: Adequate nutrition will be maintained Outcome: Progressing   Problem: Coping: Goal: Level of anxiety will decrease Outcome: Progressing   Problem: Elimination: Goal: Will not experience complications related to bowel motility Outcome: Progressing Goal: Will not experience complications related to urinary retention Outcome: Progressing   Problem: Safety: Goal: Ability to remain free from injury will improve Outcome: Progressing   Problem: Skin Integrity: Goal: Risk for impaired skin integrity will decrease Outcome: Progressing   

## 2017-08-13 NOTE — Progress Notes (Addendum)
Progress Note  Patient Name: Randall Chen Date of Encounter: 08/13/2017  Primary Cardiologist: Dr. Radford Pax  Subjective   Slept much better last night, feels OK, no complaints  Inpatient Medications    Scheduled Meds: . amoxicillin  500 mg Oral BID  . dofetilide  500 mcg Oral BID  . ezetimibe  10 mg Oral Daily  . metoprolol tartrate  12.5 mg Oral BID  . pantoprazole  80 mg Oral Daily  . rosuvastatin  5 mg Oral Daily  . sodium chloride flush  3 mL Intravenous Q12H  . tamsulosin  0.4 mg Oral Daily  . [START ON 08/15/2017] vitamin B-12  1,000 mcg Oral Q Fri  . Warfarin - Pharmacist Dosing Inpatient   Does not apply q1800   Continuous Infusions: . sodium chloride Stopped (08/12/17 1022)   PRN Meds: sodium chloride, acetaminophen, fluticasone, nitroGLYCERIN, sodium chloride flush, zolpidem   Vital Signs    Vitals:   08/12/17 0959 08/12/17 1540 08/12/17 2100 08/13/17 0457  BP: 125/79 114/80 110/63 119/76  Pulse: 63 (!) 48 63 87  Resp:   19 19  Temp:  97.8 F (36.6 C) 98.1 F (36.7 C) 97.9 F (36.6 C)  TempSrc:  Oral Oral Oral  SpO2:  96% 96% 95%  Weight:    231 lb 6.4 oz (105 kg)  Height:        Intake/Output Summary (Last 24 hours) at 08/13/2017 0859 Last data filed at 08/12/2017 1022 Gross per 24 hour  Intake 180 ml  Output --  Net 180 ml   Filed Weights   08/11/17 1530 08/12/17 0356 08/13/17 0457  Weight: 235 lb 12.8 oz (107 kg) 232 lb 12.8 oz (105.6 kg) 231 lb 6.4 oz (105 kg)    Telemetry    AFib, 50-70's - Personally Reviewed  ECG      AFib, 61bpm, QTc is OK- Personally Reviewed/with Dr. Lovena Le  Physical Exam   No change in exam today GEN: No acute distress.   Neck: No JVD Cardiac: irreg-irreg, no murmurs, rubs, or gallops.  Respiratory: CTA b/l. GI: Soft, nontender, non-distended  MS: No edema; No deformity. Neuro:  Nonfocal  Psych: Normal affect   Labs    Chemistry Recent Labs  Lab 08/11/17 1038 08/12/17 0606 08/13/17 0423  NA  142 142 142  K 4.7 4.7 4.8  CL 107 109 109  CO2 28 25 28   GLUCOSE 91 115* 107*  BUN 22 27* 23  CREATININE 1.41* 1.31* 1.29*  CALCIUM 9.6 9.4 9.1  GFRNONAA 45* 49* 50*  GFRAA 52* 57* 58*  ANIONGAP 7 8 5      HematologyNo results for input(s): WBC, RBC, HGB, HCT, MCV, MCH, MCHC, RDW, PLT in the last 168 hours.  Cardiac EnzymesNo results for input(s): TROPONINI in the last 168 hours. No results for input(s): TROPIPOC in the last 168 hours.   BNPNo results for input(s): BNP, PROBNP in the last 168 hours.   DDimer No results for input(s): DDIMER in the last 168 hours.   Radiology    No results found.  Cardiac Studies   10/09/16: TTE Study Conclusions - Left ventricle: Wall thickness was increased in a pattern of severe LVH. The estimated ejection fraction was 55%. - Aortic valve: MIld AS gradients stable since September 2017. 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 Valve area (VTI): 2.92 cm^2. Valve area (Vmax): 2.71 cm^2. Valve area (Vmean): 2.56 cm^2. - Left atrium: The atrium was moderately dilated. - Right ventricle: The cavity  size was mildly dilated. - Atrial septum: No defect or patent foramen ovale was identified.  (LA 55mm)  Patient Profile     84 y.o. male with a history of CAD (prior PCI's, last 2005)m known bicuspid AV, mild AS, mild root/ascending ao dilation OSA w/CPAP, HTN, HLD, and PAFib, admitted for Tikosyn load  Assessment & Plan    1. Persistent Afib     CHA2DS2Vasc is 4, on warfarin     Tikosyn load     K+ 4.8     Mag 2.0     Creat 1.29     QT is stable, ok to continue loadt     INR 2.86, Pharmacy for warfarin  Unfortunately the patient had been taking Benadryl for his bee stings, last dose was around 10-11PM on Sunday evening  DCCV tomorrow if not in SR, pt is agreeable and reminded, NPO after MN tonight  2. HTN     Continue home meds  3. CAD     No anginal complaints     Continue home meds    For questions or updates,  please contact [place] Please consult [email or web address] for contact info under Cardiology/STEMI.      Signed, Baldwin Jamaica, PA-C  08/13/2017, 8:59 AM    EP attending  Patient seen and examined. Agree with above. The patient is stable and his QT appears acceptable. Continue dofetilide and plan for DCCV tomorrow if he does not revert back to NSR.   Mikle Bosworth.D.

## 2017-08-13 NOTE — Consult Note (Signed)
            [facility] CM Primary Care Navigator  08/13/2017  Randall Chen December 13, 1932 [id number]   Went to see patientat the bedsideto identify possible discharge needs. Patientreports that he was having "shortness of breath and easily tired" and is here for Tikosyn initiation/ monitoringthat led to this admission. . PatientendorsesDr.John Laurann Montana with [facility] as Randall Chen care provider.   [place] obtainmedications withoutdifficulty.  Patient reportsmanaginghis ownmedications at home using "pill box" system filledonce a week.  Patientstatesthathe has beendrivingprior to admission butwife Randall Chen) will be providingtransportationtohis doctors' appointments if needed after discharge.   Patient reports living with wife who can be his primary caregiver at home when needed. Otherwise, he is independent with self care.   Anticipated discharge plan ishomeaccording topatient.  Patientvoiced understanding to call primary care provider'soffice whenhereturns home for a post discharge follow-up appointment within1- 2 weeksor sooner if needs arise.Patient letter (with PCP's contact number) was provided asareminder.  Explained topatientregardingTHN CM services available for health management/ resourcesat home but hedeniesanyneeds or concerns at this time, andhad declinedTHN- CMservices which includesEMMIcalls to follow-up withhis recovery (states "I don't believe it is needed for now") Patienthowever, expressed understanding to seekreferral to [facility] care managementfrom primary care providerifdeemed necessary and appropriate for servicesin the future.   [facility] care management information provided for future needs that may arise.  Primary care provider's office is listed as providing transition of care (TOC) follow-up.   For additional questions please  contact:  Edwena Felty A. Marirose Deveney, BSN, RN-BC [facility] PRIMARY CARE Navigator Cell: [phone]

## 2017-08-14 ENCOUNTER — Encounter (HOSPITAL_COMMUNITY): Payer: Self-pay | Admitting: Certified Registered"

## 2017-08-14 ENCOUNTER — Other Ambulatory Visit: Payer: Self-pay

## 2017-08-14 ENCOUNTER — Encounter (HOSPITAL_COMMUNITY)
Admission: RE | Disposition: A | Discharge status: Home / Self Care | Payer: Self-pay | Source: Ambulatory Visit | Attending: Cardiology

## 2017-08-14 LAB — PROTIME-INR
INR: 2.84
Prothrombin Time: 29.6 seconds — ABNORMAL HIGH (ref 11.4–15.2)

## 2017-08-14 LAB — BASIC METABOLIC PANEL
Anion gap: 4 — ABNORMAL LOW (ref 5–15)
BUN: 25 mg/dL — ABNORMAL HIGH (ref 8–23)
CO2: 29 mmol/L (ref 22–32)
Calcium: 8.8 mg/dL — ABNORMAL LOW (ref 8.9–10.3)
Chloride: 110 mmol/L (ref 98–111)
Creatinine, Ser: 1.43 mg/dL — ABNORMAL HIGH (ref 0.61–1.24)
GFR calc Af Amer: 51 mL/min — ABNORMAL LOW (ref >60)
GFR calc non Af Amer: 44 mL/min — ABNORMAL LOW (ref >60)
Glucose, Bld: 107 mg/dL — ABNORMAL HIGH (ref 70–99)
Potassium: 4.8 mmol/L (ref 3.5–5.1)
Sodium: 143 mmol/L (ref 135–145)

## 2017-08-14 LAB — MAGNESIUM: Magnesium: 1.9 mg/dL (ref 1.7–2.4)

## 2017-08-14 SURGERY — CANCELLED PROCEDURE

## 2017-08-14 MED ORDER — DOFETILIDE 250 MCG PO CAPS
250.0000 ug | ORAL_CAPSULE | Freq: Two times a day (BID) | ORAL | Status: DC
  Administered 2017-08-14 – 2017-08-15 (×2): 250 ug via ORAL
  Filled 2017-08-14 (×2): qty 1
  Filled 2017-08-14: qty 28

## 2017-08-14 MED ORDER — DOFETILIDE 500 MCG PO CAPS: 500.0000 ug | ORAL_CAPSULE | Freq: Two times a day (BID) | ORAL | Status: DC

## 2017-08-14 MED ORDER — WARFARIN SODIUM 2.5 MG PO TABS
2.5000 mg | ORAL_TABLET | Freq: Once | ORAL | Status: AC
  Administered 2017-08-14: 2.5 mg via ORAL
  Filled 2017-08-14: qty 1

## 2017-08-14 NOTE — Progress Notes (Signed)
ANTICOAGULATION CONSULT NOTE - Follow Up Consult  Pharmacy Consult for Coumadin Indication: atrial fibrillation  Allergies  Allergen Reactions  . Prednisone Other (See Comments)    anxiety  . Zocor [Simvastatin] Other (See Comments)    Muscle aches  . Yellow Jacket Venom [Bee Venom] Swelling    Swelling and raised bumps at site  . Hydrocodone Rash  . Oxycodone Hcl Rash    Patient Measurements: Height: 6\' 2"  (188 cm) Weight: 232 lb 9.6 oz (105.5 kg) IBW/kg (Calculated) : 82.2  Vital Signs: Temp: 97.7 F (36.5 C) (07/18 0540) Temp Source: Oral (07/18 0540) BP: 125/74 (07/18 0828) Pulse Rate: 60 (07/18 0828)  Labs: Recent Labs    08/12/17 0606 08/13/17 0423 08/14/17 0322  LABPROT 27.8* 29.7* 29.6*  INR 2.62 2.86 2.84  CREATININE 1.31* 1.29* 1.43*    Estimated Creatinine Clearance: 51.5 mL/min (A) (by C-G formula based on SCr of 1.43 mg/dL (H)).   Medications:  Coumadin PTA  Assessment: Randall Chen is a 84 yo M who presents to the hospital for Tikosyn initiation.  Pt is on Coumadin PTA for his afib.  He was seen in clinic 7/15 with an INR of 3.6.    The Coumadin clinic plan was to reduce Coumadin dose by half that night, then restart home dose [2.5mg  (7/15) then resume 2.5mg  daily except 5mg  on MWF].  Patient's INR is at goal.   Goal of Therapy:  INR 2-3 Monitor platelets by anticoagulation protocol: Yes   Plan:  Coumadin 2.5mg  po x1 tonight.  Daily PT/INR  Randall Chen, Sherian Rein D PGY1 Pharmacy Resident  Phone [phone] 08/14/2017      10:44 AM

## 2017-08-14 NOTE — Plan of Care (Signed)
  Problem: Education: Goal: Knowledge of General Education information will improve Outcome: Progressing   Problem: Health Behavior/Discharge Planning: Goal: Ability to manage health-related needs will improve Outcome: Progressing   Problem: Clinical Measurements: Goal: Ability to maintain clinical measurements within normal limits will improve Outcome: Progressing Goal: Will remain free from infection Outcome: Progressing Goal: Diagnostic test results will improve Outcome: Progressing Goal: Respiratory complications will improve Outcome: Progressing Goal: Cardiovascular complication will be avoided Outcome: Progressing   Problem: Activity: Goal: Risk for activity intolerance will decrease Outcome: Progressing   Problem: Nutrition: Goal: Adequate nutrition will be maintained Outcome: Progressing   Problem: Coping: Goal: Level of anxiety will decrease Outcome: Progressing   Problem: Elimination: Goal: Will not experience complications related to bowel motility Outcome: Progressing Goal: Will not experience complications related to urinary retention Outcome: Progressing   Problem: Safety: Goal: Ability to remain free from injury will improve Outcome: Progressing   Problem: Skin Integrity: Goal: Risk for impaired skin integrity will decrease Outcome: Progressing   

## 2017-08-14 NOTE — Progress Notes (Signed)
Telemetry reviewed, no V arrhythmias, converted with drug EKGs reviewed with Dr. Lovena Le. 6th dose will be this evening, will plan to give at 7pm, EKG at 9pm with anticipated discharge if QT stable. Dr. Lovena Le will look at EKG this evening.  Tommye Standard, PA-C

## 2017-08-14 NOTE — Progress Notes (Signed)
QT has prolonged after his dose this AM, will reduce his dose for tonight, the patient initially was hoping to go home this evening, though will hold him overnight.  Dr. Lovena Le discussed this with him.  Tommye Standard, PA-C

## 2017-08-15 LAB — BASIC METABOLIC PANEL
Anion gap: 10 (ref 5–15)
BUN: 21 mg/dL (ref 8–23)
CO2: 24 mmol/L (ref 22–32)
Calcium: 9 mg/dL (ref 8.9–10.3)
Chloride: 107 mmol/L (ref 98–111)
Creatinine, Ser: 1.07 mg/dL (ref 0.61–1.24)
GFR calc Af Amer: >60 mL/min (ref >60)
GFR calc non Af Amer: >60 mL/min (ref >60)
Glucose, Bld: 96 mg/dL (ref 70–99)
Potassium: 4.1 mmol/L (ref 3.5–5.1)
Sodium: 141 mmol/L (ref 135–145)

## 2017-08-15 LAB — PROTIME-INR
INR: 2.89
Prothrombin Time: 30 seconds — ABNORMAL HIGH (ref 11.4–15.2)

## 2017-08-15 LAB — MAGNESIUM: Magnesium: 1.9 mg/dL (ref 1.7–2.4)

## 2017-08-15 MED ORDER — WARFARIN SODIUM 2.5 MG PO TABS: 2.5000 mg | ORAL_TABLET | Freq: Once | ORAL | Status: DC

## 2017-08-15 MED ORDER — DOFETILIDE 250 MCG PO CAPS: 250.0000 ug | ORAL_CAPSULE | Freq: Two times a day (BID) | ORAL | 1 refills | Status: DC

## 2017-08-15 MED ORDER — MAGNESIUM OXIDE 400 MG PO TABS: 400.0000 mg | ORAL_TABLET | Freq: Every day | ORAL | 1 refills | Status: DC

## 2017-08-15 MED ORDER — METOPROLOL TARTRATE 25 MG PO TABS: 12.5000 mg | ORAL_TABLET | Freq: Two times a day (BID) | ORAL | 1 refills | Status: DC

## 2017-08-15 NOTE — Discharge Instructions (Signed)

## 2017-08-15 NOTE — Discharge Summary (Addendum)
ELECTROPHYSIOLOGY PROCEDURE DISCHARGE SUMMARY    Patient ID: Randall Chen,  MRN: [id number], DOB/AGE: [birth year] 84 y.o.  Admit date: 08/11/2017 Discharge date: 08/15/2017  Primary Care Physician: Lavone Orn, MD  Primary Cardiologist: Dr. Radford Pax Electrophysiologist: new, Dr. Lovena Le  Primary Discharge Diagnosis:  1.  Persistent atrial fibrillation status post Tikosyn loading this admission      CHA2DS2Vasc is 4, on warfarin ()monitored and managed by coumadin clinic)  Secondary Discharge Diagnosis:  1. CAD 2. HTN  Allergies  Allergen Reactions  . Prednisone Other (See Comments)    anxiety  . Zocor [Simvastatin] Other (See Comments)    Muscle aches  . Yellow Jacket Venom [Bee Venom] Swelling    Swelling and raised bumps at site  . Hydrocodone Rash  . Oxycodone Hcl Rash     Procedures This Admission:  1.  Tikosyn loading    Brief HPI: Randall Chen is a 84 y.o. male with a past medical history as noted above.  He was referred to the AFib clinic in the outpatient setting for treatment options of atrial fibrillation.  Risks, benefits, and alternatives to Tikosyn were reviewed with the patient who wished to proceed.    Hospital Course:  The patient was admitted he had taken benadryl the evening prior 2/2 bee stings so his start was delayed, after >24 hours post benadryl passed Tikosyn was initiated.  Renal function and electrolytes were followed during the hospitalization.  He did require some magnesium replacement and will be prescribed daily Mag ox for home.  His QTc lengthened and required down-titration of drug, though was stable at 267mcg dosing.  He converted with drug and did not required DCCV.   He was monitored until discharge on telemetry which demonstrated SR.  On the day of discharge, he feels well, his be stings continued to improve and he will complete his antibiotic regime his PMD had started him on. The patient feels well, he was examined by Dr Santina Trillo  who considered them stable for discharge to home.  Follow-up has been arranged with the AFib clinic in 1 week and with Dr Lovena Le in 4 weeks.   The patient was reminded not to use Benadryl.  Physical Exam: Vitals:   08/14/17 0828 08/14/17 1451 08/14/17 1940 08/15/17 0501  BP: 125/74 114/62 (!) 146/78 135/72  Pulse: 60 (!) 55 (!) 58 (!) 55  Resp:   18 18  Temp:  97.6 F (36.4 C) 97.8 F (36.6 C) 97.8 F (36.6 C)  TempSrc:  Oral Oral Oral  SpO2:  96% 97% 97%  Weight:    232 lb (105.2 kg)  Height:        GEN- The patient is well appearing, alert and oriented x 3 today.   HEENT: normocephalic, atraumatic; sclera clear, conjunctiva pink; hearing intact; oropharynx clear; neck supple, no JVP Lymph- no cervical lymphadenopathy Lungs- CTA b/l normal work of breathing.  No wheezes, rales, rhonchi Heart- RRR, no murmurs, rubs or gallops, PMI not laterally displaced GI- soft, non-tender, non-distendedy Extremities- no clubbing, cyanosis, or edema MS- no significant deformity or atrophy Skin- warm and dry, no rash or lesion Psych- euthymic mood, full affect Neuro- strength and sensation are intact   Labs:   Lab Results  Component Value Date   WBC 6.3 10/25/2016   HGB 13.6 10/25/2016   HCT 41.4 10/25/2016   MCV 94 10/25/2016   PLT 155 10/25/2016    Recent Labs  Lab 08/15/17 0628  NA 141  K 4.1  CL 107  CO2 24  BUN 21  CREATININE 1.07  CALCIUM 9.0  GLUCOSE 96     Discharge Medications:  Allergies as of 08/15/2017      Reactions   Prednisone Other (See Comments)   anxiety   Zocor [simvastatin] Other (See Comments)   Muscle aches   Yellow Jacket Venom [bee Venom] Swelling   Swelling and raised bumps at site   Hydrocodone Rash   Oxycodone Hcl Rash      Medication List    TAKE these medications   amoxicillin 500 MG capsule Commonly known as:  AMOXIL Take 500 mg by mouth 2 (two) times daily. Notes to patient:  You received 8 doses while here, complete the course  of antibiotic as insctructed by your primary physician   dofetilide 250 MCG capsule Commonly known as:  TIKOSYN Take 1 capsule (250 mcg total) by mouth 2 (two) times daily.   DRY EYES OP Apply 1 drop to eye daily as needed (dry eyes).   ezetimibe 10 MG tablet Commonly known as:  ZETIA Take 1 tablet (10 mg total) by mouth daily.   fluticasone 50 MCG/ACT nasal spray Commonly known as:  FLONASE Place 1 spray into both nostrils daily as needed for allergies or rhinitis.   magnesium oxide 400 MG tablet Commonly known as:  MAG-OX Take 1 tablet (400 mg total) by mouth daily.   metoprolol tartrate 25 MG tablet Commonly known as:  LOPRESSOR Take 0.5 tablets (12.5 mg total) by mouth 2 (two) times daily. What changed:  how much to take   nitroGLYCERIN 0.4 MG SL tablet Commonly known as:  NITROSTAT Place 1 tablet (0.4 mg total) under the tongue every 5 (five) minutes as needed for chest pain.   omeprazole 40 MG capsule Commonly known as:  PRILOSEC Take 40 mg by mouth daily as needed (heartburn).   rosuvastatin 5 MG tablet Commonly known as:  CRESTOR Take 1 tablet (5 mg total) daily by mouth. What changed:  when to take this   tamsulosin 0.4 MG Caps capsule Commonly known as:  FLOMAX Take 0.4 mg by mouth daily.   vitamin B-12 1000 MCG tablet Commonly known as:  CYANOCOBALAMIN Take 1,000 mcg by mouth every 7 (seven) days.   warfarin 5 MG tablet Commonly known as:  COUMADIN Take as directed. If you are unsure how to take this medication, talk to your nurse or doctor. Original instructions:  Take as directed by coumadin clinic What changed:    how much to take  how to take this  when to take this  additional instructions   zolpidem 5 MG tablet Commonly known as:  AMBIEN Take 2.5-5 mg by mouth at bedtime as needed for sleep.       Disposition:  Home Discharge Instructions    Diet - low sodium heart healthy   Complete by:  As directed    Increase activity slowly    Complete by:  As directed      Follow-up Information    [facility] Follow up on 08/21/2017.   Specialty:  Cardiology Why:  9:00AM Contact information: [address] [id number] [place] [phone]       [facility] Follow up on 08/20/2017.   Specialty:  Cardiology Why:  1:15PM, coumadin clinic Contact information: [address], [address] [place]       Evans Lance, MD Follow up  on 09/19/2017.   Specialty:  Cardiology Why:  2:00PM Contact information: [address] [address] [phone]           Duration of Discharge Encounter: Greater than 30 minutes including physician time.  Venetia Night, PA-C 08/15/2017 11:52 AM   EP Attending  Patient seen and examined. Agree with above. The patient is doing well and his QT improved nicely with a reduction in his dose of dofetilide. He will be discharged home with usual followup.   Mikle Bosworth.D.

## 2017-08-15 NOTE — Progress Notes (Signed)
ANTICOAGULATION CONSULT NOTE - Follow Up Consult  Pharmacy Consult for Coumadin Indication: atrial fibrillation  Allergies  Allergen Reactions  . Prednisone Other (See Comments)    anxiety  . Zocor [Simvastatin] Other (See Comments)    Muscle aches  . Yellow Jacket Venom [Bee Venom] Swelling    Swelling and raised bumps at site  . Hydrocodone Rash  . Oxycodone Hcl Rash    Patient Measurements: Height: 6\' 2"  (188 cm) Weight: 232 lb (105.2 kg) IBW/kg (Calculated) : 82.2  Vital Signs: Temp: 97.8 F (36.6 C) (07/19 0501) Temp Source: Oral (07/19 0501) BP: 135/72 (07/19 0501) Pulse Rate: 55 (07/19 0501)  Labs: Recent Labs    08/13/17 0423 08/14/17 0322 08/15/17 0628  LABPROT 29.7* 29.6* 30.0*  INR 2.86 2.84 2.89  CREATININE 1.29* 1.43* 1.07    Estimated Creatinine Clearance: 68.8 mL/min (by C-G formula based on SCr of 1.07 mg/dL).   Medications:  Coumadin PTA  Assessment: Mr. Sarli is a 84 yo M who presents to the hospital for Tikosyn initiation.  Pt is on Coumadin PTA for his afib.  He was seen in clinic 7/15 with an INR of 3.6, possibly due to amoxicillin started PTA (end date 7/20) after bee stings per MD note.  The Coumadin clinic plan was to reduce Coumadin dose by half that night, then restart home dose [2.5mg  (7/15) then resume 2.5mg  daily except 5mg  on MWF].  Patient's INR is at goal - stable at 2.89. No recent CBC. No bleed documented.  Goal of Therapy:  INR 2-3 Monitor platelets by anticoagulation protocol: Yes   Plan:  Coumadin 2.5mg  PO x1 again tonight Monitor daily INR, CBC, s/sx bleeding, DDI  Elicia Lamp, PharmD, BCPS Clinical Pharmacist Clinical phone [phone] Please check AMION for all [place] contact numbers 08/15/2017 8:44 AM

## 2017-08-15 NOTE — Progress Notes (Signed)
Tikosyn given to patient.  Discharged to home.

## 2017-08-15 NOTE — Discharge Summary (Addendum)
Please see note labled progress note for today, is discharge summary.  Tommye Standard, PA-C

## 2017-08-15 NOTE — Progress Notes (Signed)
Discharge order obtained.  IV removed intact, telemetry monitor removed.  Reviewed AVS with patient/family, including medications, activity/restrictions, follow-up appointments.  Patient and family verbalized understanding.  Questions asked and answered.  Belongings given to family/patient.  Copy of AVS signature form placed in chart.  Awaiting Tikosyn prescription to be filled in Pharmacy prior to discharge.

## 2017-08-21 ENCOUNTER — Other Ambulatory Visit: Payer: Self-pay

## 2017-08-21 ENCOUNTER — Ambulatory Visit: Payer: PPO | Admitting: *Deleted

## 2017-08-21 ENCOUNTER — Encounter (HOSPITAL_COMMUNITY): Payer: Self-pay | Admitting: Nurse Practitioner

## 2017-08-21 ENCOUNTER — Ambulatory Visit (HOSPITAL_COMMUNITY)
Admit: 2017-08-21 | Discharge: 2017-08-21 | Disposition: A | Discharge status: Home / Self Care | Payer: PPO | Attending: Nurse Practitioner | Admitting: Nurse Practitioner

## 2017-08-21 VITALS — BP 136/72 | HR 47 | Ht 74.0 in | Wt 238.0 lb

## 2017-08-21 DIAGNOSIS — I480 Paroxysmal atrial fibrillation: Secondary | ICD-10-CM

## 2017-08-21 DIAGNOSIS — Z7901 Long term (current) use of anticoagulants: Secondary | ICD-10-CM | POA: Insufficient documentation

## 2017-08-21 DIAGNOSIS — Z888 Allergy status to other drugs, medicaments and biological substances status: Secondary | ICD-10-CM | POA: Diagnosis not present

## 2017-08-21 DIAGNOSIS — M1712 Unilateral primary osteoarthritis, left knee: Secondary | ICD-10-CM | POA: Diagnosis not present

## 2017-08-21 DIAGNOSIS — Z955 Presence of coronary angioplasty implant and graft: Secondary | ICD-10-CM | POA: Insufficient documentation

## 2017-08-21 DIAGNOSIS — I481 Persistent atrial fibrillation: Secondary | ICD-10-CM | POA: Insufficient documentation

## 2017-08-21 DIAGNOSIS — Z79899 Other long term (current) drug therapy: Secondary | ICD-10-CM | POA: Insufficient documentation

## 2017-08-21 DIAGNOSIS — Z87891 Personal history of nicotine dependence: Secondary | ICD-10-CM | POA: Insufficient documentation

## 2017-08-21 DIAGNOSIS — I252 Old myocardial infarction: Secondary | ICD-10-CM | POA: Diagnosis not present

## 2017-08-21 DIAGNOSIS — E669 Obesity, unspecified: Secondary | ICD-10-CM | POA: Insufficient documentation

## 2017-08-21 DIAGNOSIS — Z6830 Body mass index (BMI) 30.0-30.9, adult: Secondary | ICD-10-CM | POA: Diagnosis not present

## 2017-08-21 DIAGNOSIS — I350 Nonrheumatic aortic (valve) stenosis: Secondary | ICD-10-CM | POA: Diagnosis not present

## 2017-08-21 DIAGNOSIS — Z885 Allergy status to narcotic agent status: Secondary | ICD-10-CM | POA: Insufficient documentation

## 2017-08-21 DIAGNOSIS — E785 Hyperlipidemia, unspecified: Secondary | ICD-10-CM | POA: Insufficient documentation

## 2017-08-21 DIAGNOSIS — Z5181 Encounter for therapeutic drug level monitoring: Secondary | ICD-10-CM | POA: Diagnosis not present

## 2017-08-21 DIAGNOSIS — G4733 Obstructive sleep apnea (adult) (pediatric): Secondary | ICD-10-CM | POA: Diagnosis not present

## 2017-08-21 DIAGNOSIS — I10 Essential (primary) hypertension: Secondary | ICD-10-CM | POA: Diagnosis not present

## 2017-08-21 DIAGNOSIS — Z8546 Personal history of malignant neoplasm of prostate: Secondary | ICD-10-CM | POA: Diagnosis not present

## 2017-08-21 DIAGNOSIS — K219 Gastro-esophageal reflux disease without esophagitis: Secondary | ICD-10-CM | POA: Diagnosis not present

## 2017-08-21 DIAGNOSIS — I4819 Other persistent atrial fibrillation: Secondary | ICD-10-CM

## 2017-08-21 LAB — BASIC METABOLIC PANEL
Anion gap: 9 (ref 5–15)
BUN: 25 mg/dL — ABNORMAL HIGH (ref 8–23)
CO2: 26 mmol/L (ref 22–32)
Calcium: 9.5 mg/dL (ref 8.9–10.3)
Chloride: 106 mmol/L (ref 98–111)
Creatinine, Ser: 1.09 mg/dL (ref 0.61–1.24)
GFR calc Af Amer: >60 mL/min (ref >60)
GFR calc non Af Amer: >60 mL/min (ref >60)
Glucose, Bld: 101 mg/dL — ABNORMAL HIGH (ref 70–99)
Potassium: 4.7 mmol/L (ref 3.5–5.1)
Sodium: 141 mmol/L (ref 135–145)

## 2017-08-21 LAB — POCT INR: INR: 2.8 (ref 2.0–3.0)

## 2017-08-21 LAB — MAGNESIUM: Magnesium: 2.1 mg/dL (ref 1.7–2.4)

## 2017-08-21 NOTE — Patient Instructions (Signed)
Description   Continue on same dosage of coumadin 1/2 tablet daily except 1 tablet on Mondays, Wednesdays and Fridays. Recheck INR in 4 weeks. Call us with any medication changes or concerns to # [phone] Coumadin Clinic.

## 2017-08-21 NOTE — Progress Notes (Signed)
Primary Care Physician: Lavone Orn, MD Primary Cardiologist: Zygmund Passero is a 84 y.o. male with a history of persistent atrial fibrillation who presents for follow up in the [facility].  Since last being seen in clinic, the patient reports doing reasonably well. He is now on Tikosyn and is Sinus brady with a v rate of 47 bpm, per pt, he usually sees HR's in the 60's at home. He is not lightheaded with this.   Atrial Fibrillation Risk Factors:  he does have symptoms or diagnosis of sleep apnea. he is compliant with CPAP therapy.  he does not have a history of rheumatic fever.  he does not have a history of alcohol use.  he has a BMI of Body mass index is 30.56 kg/m.Marland Kitchen Filed Weights   08/21/17 0906  Weight: 238 lb (108 kg)    LA size: 46   Atrial Fibrillation Management history:  Previous antiarrhythmic drugs: none  Previous cardioversions: 07/2013, 02/2016, 10/2016  Previous ablations: none  CHADS2VASC score: 4  Anticoagulation history: Warfarin   Past Medical History:  Diagnosis Date  . Aortic stenosis    mild by echo 09/2016  . Coronary disease    2 Vessel,inferior wall MI june 2003, PTCA and Stent RCA June 2003, PTCA stent, Left Circumflex OM 02 Mar 2003, Dr Radford Pax  . Dilated aortic root ([place])    3mm by echo 09/2016  . DJD (degenerative joint disease) of knee    LEFT  . Dyslipidemia   . GERD (gastroesophageal reflux disease)   . History of rotator cuff syndrome    (Right) with biceps tendinitis  . Hypertension   . Obesity   . OSA (obstructive sleep apnea)    presented with HA and drowsiness,mild sleep apnea with oxygen desats to 78%  . Persistent atrial fibrillation ([place])   . Prostate cancer ([place])    /BPH, radioactive seed 2007, [facility]   . Visit for monitoring Tikosyn therapy 08/12/2017   Past Surgical History:  Procedure Laterality Date  . CARDIAC CATHETERIZATION    . CARDIOVERSION N/A 08/27/2013   Procedure: CARDIOVERSION;  Surgeon: Sueanne Margarita, MD;  Location: [facility] ENDOSCOPY;  Service: Cardiovascular;  Laterality: N/A;  . CARDIOVERSION N/A 03/15/2016   Procedure: CARDIOVERSION;  Surgeon: Dorothy Spark, MD;  Location: [place];  Service: Cardiovascular;  Laterality: N/A;  . CARDIOVERSION N/A 10/30/2016   Procedure: CARDIOVERSION;  Surgeon: Jerline Pain, MD;  Location: [facility] ENDOSCOPY;  Service: Cardiovascular;  Laterality: N/A;  . CORONARY ANGIOPLASTY    . JOINT REPLACEMENT     left knee replacement 2009  . TEE WITHOUT CARDIOVERSION N/A 10/30/2016   Procedure: TRANSESOPHAGEAL ECHOCARDIOGRAM (TEE);  Surgeon: Jerline Pain, MD;  [facility] END[facility]COPY;  Service: Cardiovascular;  Laterality: N/A;    Current Outpatient Medications  Medication Sig Dispense Refill  . Artificial Tear Ointment (DRY EYES OP) Apply 1 drop to eye daily as needed (dry eyes).    . dofetilide (TIKOSYN) 250 MCG capsule Take 1 capsule (250 mcg total) by mouth 2 (two) times daily. 180 capsule 1  . ezetimibe (ZETIA) 10 MG tablet Take 1 tablet (10 mg total) by mouth daily. 90 tablet 2  . fluticasone (FLONASE) 50 MCG/ACT nasal spray Place 1 spray into both nostrils daily as needed for allergies or rhinitis.    . magnesium oxide (MAG-OX) 400 MG tablet Take 1 tablet (400 mg total) by mouth daily. 90 tablet 1  . metoprolol tartrate (LOPRESSOR)  25 MG tablet Take 0.5 tablets (12.5 mg total) by mouth 2 (two) times daily. 90 tablet 1  . nitroGLYCERIN (NITROSTAT) 0.4 MG SL tablet Place 1 tablet (0.4 mg total) under the tongue every 5 (five) minutes as needed for chest pain. 25 tablet 5  . omeprazole (PRILOSEC) 40 MG capsule Take 40 mg by mouth daily as needed (heartburn).    . rosuvastatin (CRESTOR) 5 MG tablet Take 1 tablet (5 mg total) daily by mouth. (Patient taking differently: Take 5 mg by mouth daily at 6 PM. ) 90 tablet 3  . tamsulosin (FLOMAX) 0.4 MG CAPS capsule Take 0.4 mg by mouth daily.    . vitamin B-12  (CYANOCOBALAMIN) 1000 MCG tablet Take 1,000 mcg by mouth every 7 (seven) days.     Marland Kitchen warfarin (COUMADIN) 5 MG tablet Take as directed by coumadin clinic (Patient taking differently: Take 2.5-5 mg by mouth See admin instructions. Take 5mg  by mouth on Monday, Wednesday and Friday. Tuesday, Thursday, Saturday and Sunday take 2.5mg ) 90 tablet 0  . zolpidem (AMBIEN) 5 MG tablet Take 2.5-5 mg by mouth at bedtime as needed for sleep.      No current facility-administered medications for this encounter.    Facility-Administered Medications Ordered in Other Encounters  Medication Dose Route Frequency Provider Last Rate Last Dose  . 0.9 %  sodium chloride infusion    Continuous PRN Lowella Dell, CRNA        Allergies  Allergen Reactions  . Prednisone Other (See Comments)    anxiety  . Zocor [Simvastatin] Other (See Comments)    Muscle aches  . Yellow Jacket Venom [Bee Venom] Swelling    Swelling and raised bumps at site  . Hydrocodone Rash  . Oxycodone Hcl Rash    Social History   Socioeconomic History  . Marital status: Married    Spouse name: Not on file  . Number of children: Not on file  . Years of education: Not on file  . Highest education level: Not on file  Occupational History  . Not on file  Social Needs  . Financial resource strain: Not on file  . Food insecurity:    Worry: Not on file    Inability: Not on file  . Transportation needs:    Medical: Not on file    Non-medical: Not on file  Tobacco Use  . Smoking status: Former Smoker    Last attempt to quit: 01/28/1958    Years since quitting: 59.6  . Smokeless tobacco: Never Used  Substance and Sexual Activity  . Alcohol use: Yes    Comment: "a few a week"  . Drug use: No  . Sexual activity: Not on file  Lifestyle  . Physical activity:    Days per week: Not on file    Minutes per session: Not on file  . Stress: Not on file  Relationships  . Social connections:    Talks on phone: Not on file    Gets  together: Not on file    Attends religious service: Not on file    Active member of club or organization: Not on file    Attends meetings of clubs or organizations: Not on file    Relationship status: Not on file  . Intimate partner violence:    Fear of current or ex partner: Not on file    Emotionally abused: Not on file    Physically abused: Not on file    Forced sexual activity: Not on file  Other Topics Concern  . Not on file  Social History Narrative  . Not on file    Family History  Problem Relation Age of Onset  . Diabetes Mother   . Peripheral vascular disease Father   . Heart attack Father   . Coronary artery disease Brother   . Liver cancer Brother   . Coronary artery disease Brother   . Heart attack Brother   . Cancer Brother   . Sudden death Brother   . Fainting Brother     ROS- All systems are reviewed and negative except as per the HPI above.  Physical Exam: Vitals:   08/21/17 0906  BP: 136/72  Pulse: (!) 47  Weight: 238 lb (108 kg)  Height: 6\' 2"  (1.88 m)    GEN- The patient is well appearing, alert and oriented x 3 today.   Head- normocephalic, atraumatic Eyes-  Sclera clear, conjunctiva pink Ears- hearing intact Oropharynx- clear Neck- supple  Lungs- Clear to ausculation bilaterally, normal work of breathing Heart- Bradycardic regular rate and rhythm, no murmurs, rubs or gallops  GI- soft, NT, ND, + BS Extremities- no clubbing, cyanosis, or edema MS- no significant deformity or atrophy Skin- no rash or lesion Psych- euthymic mood, full affect Neuro- strength and sensation are intact  Wt Readings from Last 3 Encounters:  08/21/17 238 lb (108 kg)  08/15/17 232 lb (105.2 kg)  08/11/17 235 lb 12.8 oz (107 kg)    EKG today demonstrates Sinus brady at 47 bpm, pr int 212 ms, qrs int 92 bpm, qtc 423 ms Echo 09/2016 demonstrated EF 55%, severe LVH, mild AS, LA moderately dilated  Epic records are reviewed at length today  Assessment and  Plan:  1. Persistent atrial fibrillation S/p  Tikosyn load and in SR QTc 423 msec. BMET, Mg drawn Continue Warfarin for CHADS2VASC of 4. INR's therapeutic at 2.8, checked this am Reminded of precautions with drug Reminded no benadryl   2. Overweight Body mass index is 30.56 kg/m. Weight loss recommended  3. Obstructive sleep apnea The patient reports compliance with CPAP   Follow up with AF clinic after Tikosyn load   Butch Penny C. Randell Detter, [facility] [address] [place], [place] [address] [phone]

## 2017-08-22 DIAGNOSIS — G4733 Obstructive sleep apnea (adult) (pediatric): Secondary | ICD-10-CM | POA: Diagnosis not present

## 2017-09-11 ENCOUNTER — Telehealth: Payer: Self-pay | Admitting: *Deleted

## 2017-09-11 DIAGNOSIS — G4733 Obstructive sleep apnea (adult) (pediatric): Secondary | ICD-10-CM

## 2017-09-11 NOTE — Telephone Encounter (Signed)
Please order CPAP device Model S10 at 7cm H2O with heated humidity and mask of choice    Traci TUrner

## 2017-09-11 NOTE — Telephone Encounter (Signed)
Order placed to [facility]

## 2017-09-12 ENCOUNTER — Other Ambulatory Visit: Payer: Self-pay | Admitting: Cardiology

## 2017-09-18 DIAGNOSIS — G4733 Obstructive sleep apnea (adult) (pediatric): Secondary | ICD-10-CM | POA: Diagnosis not present

## 2017-09-19 ENCOUNTER — Ambulatory Visit (INDEPENDENT_AMBULATORY_CARE_PROVIDER_SITE_OTHER): Payer: PPO

## 2017-09-19 ENCOUNTER — Ambulatory Visit: Payer: PPO | Admitting: Internal Medicine

## 2017-09-19 ENCOUNTER — Encounter: Payer: Self-pay | Admitting: Internal Medicine

## 2017-09-19 VITALS — BP 126/68 | HR 56 | Ht 74.0 in | Wt 238.9 lb

## 2017-09-19 DIAGNOSIS — I480 Paroxysmal atrial fibrillation: Secondary | ICD-10-CM | POA: Diagnosis not present

## 2017-09-19 DIAGNOSIS — I4819 Other persistent atrial fibrillation: Secondary | ICD-10-CM

## 2017-09-19 DIAGNOSIS — Z5181 Encounter for therapeutic drug level monitoring: Secondary | ICD-10-CM

## 2017-09-19 DIAGNOSIS — I481 Persistent atrial fibrillation: Secondary | ICD-10-CM

## 2017-09-19 LAB — POCT INR: INR: 2.9 (ref 2.0–3.0)

## 2017-09-19 NOTE — Patient Instructions (Addendum)

## 2017-09-19 NOTE — Progress Notes (Signed)
HPI Randall Chen returns today for followup of his atrial fib, s/p initiation of dofetilide. He feels well. He denies chest pain, sob, or palpitations. He remains active working in his garden.  Allergies  Allergen Reactions  . Prednisone Other (See Comments)    anxiety  . Zocor [Simvastatin] Other (See Comments)    Muscle aches  . Yellow Jacket Venom [Bee Venom] Swelling    Swelling and raised bumps at site  . Hydrocodone Rash  . Oxycodone Hcl Rash     Current Outpatient Medications  Medication Sig Dispense Refill  . Artificial Tear Ointment (DRY EYES OP) Apply 1 drop to eye daily as needed (dry eyes).    . dofetilide (TIKOSYN) 250 MCG capsule Take 1 capsule (250 mcg total) by mouth 2 (two) times daily. 180 capsule 1  . ezetimibe (ZETIA) 10 MG tablet Take 1 tablet (10 mg total) by mouth daily. 90 tablet 2  . fluticasone (FLONASE) 50 MCG/ACT nasal spray Place 1 spray into both nostrils daily as needed for allergies or rhinitis.    . magnesium oxide (MAG-OX) 400 MG tablet Take 1 tablet (400 mg total) by mouth daily. 90 tablet 1  . metoprolol tartrate (LOPRESSOR) 25 MG tablet Take 0.5 tablets (12.5 mg total) by mouth 2 (two) times daily. 90 tablet 1  . nitroGLYCERIN (NITROSTAT) 0.4 MG SL tablet Place 1 tablet (0.4 mg total) under the tongue every 5 (five) minutes as needed for chest pain. 25 tablet 5  . omeprazole (PRILOSEC) 40 MG capsule Take 40 mg by mouth daily as needed (heartburn).    . rosuvastatin (CRESTOR) 5 MG tablet Take 1 tablet (5 mg total) daily by mouth. (Patient taking differently: Take 5 mg by mouth daily at 6 PM. ) 90 tablet 3  . tamsulosin (FLOMAX) 0.4 MG CAPS capsule Take 0.4 mg by mouth daily.    . vitamin B-12 (CYANOCOBALAMIN) 1000 MCG tablet Take 1,000 mcg by mouth every 7 (seven) days.     Marland Kitchen warfarin (COUMADIN) 5 MG tablet Take 1/2 to 1 tablet daily as directed by coumadin clinic 90 tablet 0  . zolpidem (AMBIEN) 5 MG tablet Take 2.5-5 mg by mouth at bedtime  as needed for sleep.      No current facility-administered medications for this visit.    Facility-Administered Medications Ordered in Other Visits  Medication Dose Route Frequency Provider Last Rate Last Dose  . 0.9 %  sodium chloride infusion    Continuous PRN Lowella Dell, CRNA         Past Medical History:  Diagnosis Date  . Aortic stenosis    mild by echo 09/2016  . Coronary disease    2 Vessel,inferior wall MI june 2003, PTCA and Stent RCA June 2003, PTCA stent, Left Circumflex OM 02 Mar 2003, Dr Radford Pax  . Dilated aortic root ([place])    49mm by echo 09/2016  . DJD (degenerative joint disease) of knee    LEFT  . Dyslipidemia   . GERD (gastroesophageal reflux disease)   . History of rotator cuff syndrome    (Right) with biceps tendinitis  . Hypertension   . Obesity   . OSA (obstructive sleep apnea)    presented with HA and drowsiness,mild sleep apnea with oxygen desats to 78%  . Persistent atrial fibrillation ([place])   . Prostate cancer ([place])    /BPH, radioactive seed 2007, Nesi   . Visit for monitoring Tikosyn therapy 08/12/2017    ROS:   All  systems reviewed and negative except as noted in the HPI.   Past Surgical History:  Procedure Laterality Date  . CARDIAC CATHETERIZATION    . CARDIOVERSION N/A 08/27/2013   Procedure: CARDIOVERSION;  Surgeon: Sueanne Margarita, MD;  Location: [facility] ENDOSCOPY;  Service: Cardiovascular;  Laterality: N/A;  . CARDIOVERSION N/A 03/15/2016   Procedure: CARDIOVERSION;  Surgeon: Dorothy Spark, MD;  Location: [place];  Service: Cardiovascular;  Laterality: N/A;  . CARDIOVERSION N/A 10/30/2016   Procedure: CARDIOVERSION;  Surgeon: Jerline Pain, MD;  Location: [facility] ENDOSCOPY;  Service: Cardiovascular;  Laterality: N/A;  . CORONARY ANGIOPLASTY    . JOINT REPLACEMENT     left knee replacement 2009  . TEE WITHOUT CARDIOVERSION N/A 10/30/2016   Procedure: TRANSESOPHAGEAL ECHOCARDIOGRAM (TEE);  Surgeon: Jerline Pain, MD;  Location: [facility]  ENDOSCOPY;  Service: Cardiovascular;  Laterality: N/A;     Family History  Problem Relation Age of Onset  . Diabetes Mother   . Peripheral vascular disease Father   . Heart attack Father   . Coronary artery disease Brother   . Liver cancer Brother   . Coronary artery disease Brother   . Heart attack Brother   . Cancer Brother   . Sudden death Brother   . Fainting Brother      Social History   Socioeconomic History  . Marital status: Married    Spouse name: Not on file  . Number of children: Not on file  . Years of education: Not on file  . Highest education level: Not on file  Occupational History  . Not on file  Social Needs  . Financial resource strain: Not on file  . Food insecurity:    Worry: Not on file    Inability: Not on file  . Transportation needs:    Medical: Not on file    Non-medical: Not on file  Tobacco Use  . Smoking status: Former Smoker    Last attempt to quit: 01/28/1958    Years since quitting: 59.6  . Smokeless tobacco: Never Used  Substance and Sexual Activity  . Alcohol use: Yes    Comment: "a few a week"  . Drug use: No  . Sexual activity: Not on file  Lifestyle  . Physical activity:    Days per week: Not on file    Minutes per session: Not on file  . Stress: Not on file  Relationships  . Social connections:    Talks on phone: Not on file    Gets together: Not on file    Attends religious service: Not on file    Active member of club or organization: Not on file    Attends meetings of clubs or organizations: Not on file    Relationship status: Not on file  . Intimate partner violence:    Fear of current or ex partner: Not on file    Emotionally abused: Not on file    Physically abused: Not on file    Forced sexual activity: Not on file  Other Topics Concern  . Not on file  Social History Narrative  . Not on file     BP 126/68   Pulse (!) 56   Ht 6\' 2"  (1.88 m)   Wt 238 lb 14.4 oz (108.4 kg)   SpO2 97%   BMI 30.67 kg/m    Physical Exam:  Well appearing 84 yo man, NAD HEENT: Unremarkable Neck:  6 cm JVD, no thyromegally Lymphatics:  No adenopathy Back:  No  CVA tenderness Lungs:  Clear with no wheezes HEART:  Regular rate rhythm, no murmurs, no rubs, no clicks Abd:  soft, positive bowel sounds, no organomegally, no rebound, no guarding Ext:  2 plus pulses, no edema, no cyanosis, no clubbing Skin:  No rashes no nodules Neuro:  CN II through XII intact, motor grossly intact  EKG - nsr, QTC 360  DEVICE  Normal device function.  See PaceArt for details.   Assess/Plan: 1. PAF - he is maintaining NSR. He will continue dofetilide 2. HTN - his blood pressure is well controlled. 3. dyslipidemia - he will continue his statin therapy with crestor.  Mikle Bosworth.D.

## 2017-09-19 NOTE — Patient Instructions (Signed)
Description   Continue on same dosage of coumadin 1/2 tablet daily except 1 tablet on Mondays, Wednesdays and Fridays. Recheck INR in 6 weeks. Call us with any medication changes or concerns to # [phone] Coumadin Clinic.

## 2017-10-08 ENCOUNTER — Ambulatory Visit (HOSPITAL_COMMUNITY): Payer: PPO | Attending: Cardiovascular Disease

## 2017-10-08 ENCOUNTER — Other Ambulatory Visit: Payer: Self-pay

## 2017-10-08 ENCOUNTER — Encounter: Payer: Self-pay | Admitting: Cardiology

## 2017-10-08 DIAGNOSIS — E785 Hyperlipidemia, unspecified: Secondary | ICD-10-CM | POA: Diagnosis not present

## 2017-10-08 DIAGNOSIS — G4733 Obstructive sleep apnea (adult) (pediatric): Secondary | ICD-10-CM | POA: Diagnosis not present

## 2017-10-08 DIAGNOSIS — I2510 Atherosclerotic heart disease of native coronary artery without angina pectoris: Secondary | ICD-10-CM | POA: Insufficient documentation

## 2017-10-08 DIAGNOSIS — Z87891 Personal history of nicotine dependence: Secondary | ICD-10-CM | POA: Insufficient documentation

## 2017-10-08 DIAGNOSIS — Z8546 Personal history of malignant neoplasm of prostate: Secondary | ICD-10-CM | POA: Insufficient documentation

## 2017-10-08 DIAGNOSIS — I352 Nonrheumatic aortic (valve) stenosis with insufficiency: Secondary | ICD-10-CM | POA: Diagnosis not present

## 2017-10-08 DIAGNOSIS — I119 Hypertensive heart disease without heart failure: Secondary | ICD-10-CM | POA: Insufficient documentation

## 2017-10-08 DIAGNOSIS — I77810 Thoracic aortic ectasia: Secondary | ICD-10-CM | POA: Insufficient documentation

## 2017-10-19 DIAGNOSIS — G4733 Obstructive sleep apnea (adult) (pediatric): Secondary | ICD-10-CM | POA: Diagnosis not present

## 2017-10-27 ENCOUNTER — Encounter: Payer: Self-pay | Admitting: Cardiology

## 2017-10-27 ENCOUNTER — Ambulatory Visit (INDEPENDENT_AMBULATORY_CARE_PROVIDER_SITE_OTHER): Payer: PPO | Admitting: Cardiology

## 2017-10-27 VITALS — BP 108/60 | HR 63 | Ht 74.0 in | Wt 242.4 lb

## 2017-10-27 DIAGNOSIS — I10 Essential (primary) hypertension: Secondary | ICD-10-CM

## 2017-10-27 DIAGNOSIS — G4733 Obstructive sleep apnea (adult) (pediatric): Secondary | ICD-10-CM

## 2017-10-27 NOTE — Progress Notes (Signed)
Cardiology Office Note:    Date:  10/27/2017   ID:  Randall Chen, DOB [birth year], MRN [id number]  PCP:  Lavone Orn, MD  Cardiologist:  No primary care provider on file.    Referring MD: Lavone Orn, MD   Chief Complaint  Patient presents with  . Sleep Apnea  . Hypertension    History of Present Illness:    Randall Chen is a 84 y.o. male with a hx of  OSA on CPAP and HTN. He is here today for followup and is doing well.  He is compliant with his meds and is tolerating meds with no SE.  He is doing well with his CPAP device.  He tolerates the mask and feels the pressure is adequate.  Since going on CPAP he feels rested in the am and has no significant daytime sleepiness.  He denies any significant mouth or nasal dryness or nasal congestion.  He does not think that he snores.     Past Medical History:  Diagnosis Date  . Aortic stenosis    mild by echo 09/2017  . Coronary disease    2 Vessel,inferior wall MI june 2003, PTCA and Stent RCA June 2003, PTCA stent, Left Circumflex OM 02 Mar 2003, Dr Radford Pax  . Dilated aortic root ([place])    47mm by echo 09/2016  . DJD (degenerative joint disease) of knee    LEFT  . Dyslipidemia   . GERD (gastroesophageal reflux disease)   . History of rotator cuff syndrome    (Right) with biceps tendinitis  . Hypertension   . Obesity   . OSA (obstructive sleep apnea)    presented with HA and drowsiness,mild sleep apnea with oxygen desats to 78%  . Persistent atrial fibrillation ([place])   . Prostate cancer ([place])    /BPH, radioactive seed 2007, Nesi   . Visit for monitoring Tikosyn therapy 08/12/2017    Past Surgical History:  Procedure Laterality Date  . CARDIAC CATHETERIZATION    . CARDIOVERSION N/A 08/27/2013   Procedure: CARDIOVERSION;  Surgeon: Sueanne Margarita, MD;  Location: [facility] ENDOSCOPY;  Service: Cardiovascular;  Laterality: N/A;  . CARDIOVERSION N/A 03/15/2016   Procedure: CARDIOVERSION;  Surgeon: Dorothy Spark, MD;  Location: [place];  Service: Cardiovascular;  Laterality: N/A;  . CARDIOVERSION N/A 10/30/2016   Procedure: CARDIOVERSION;  Surgeon: Jerline Pain, MD;  Location: [facility] ENDOSCOPY;  Service: Cardiovascular;  Laterality: N/A;  . CORONARY ANGIOPLASTY    . JOINT REPLACEMENT     left knee replacement 2009  . TEE WITHOUT CARDIOVERSION N/A 10/30/2016   Procedure: TRANSESOPHAGEAL ECHOCARDIOGRAM (TEE);  Surgeon: Jerline Pain, MD;  Location: [facility] ENDOSCOPY;  Service: Cardiovascular;  Laterality: N/A;    Current Medications: Current Meds  Medication Sig  . dofetilide (TIKOSYN) 250 MCG capsule Take 1 capsule (250 mcg total) by mouth 2 (two) times daily.  Marland Kitchen ezetimibe (ZETIA) 10 MG tablet Take 1 tablet (10 mg total) by mouth daily.  . fluticasone (FLONASE) 50 MCG/ACT nasal spray Place 1 spray into both nostrils daily as needed for allergies or rhinitis.  . magnesium oxide (MAG-OX) 400 MG tablet Take 1 tablet (400 mg total) by mouth daily.  . metoprolol tartrate (LOPRESSOR) 25 MG tablet Take 0.5 tablets (12.5 mg total) by mouth 2 (two) times daily.  . nitroGLYCERIN (NITROSTAT) 0.4 MG SL tablet Place 1 tablet (0.4 mg total) under the tongue every 5 (five) minutes as needed for chest pain.  Marland Kitchen omeprazole (PRILOSEC) 40 MG capsule  Take 40 mg by mouth daily as needed (heartburn).  . rosuvastatin (CRESTOR) 5 MG tablet Take 1 tablet (5 mg total) daily by mouth. (Patient taking differently: Take 5 mg by mouth daily at 6 PM. )  . tamsulosin (FLOMAX) 0.4 MG CAPS capsule Take 0.4 mg by mouth daily.  . vitamin B-12 (CYANOCOBALAMIN) 1000 MCG tablet Take 1,000 mcg by mouth every 7 (seven) days.   Marland Kitchen warfarin (COUMADIN) 5 MG tablet Take 1/2 to 1 tablet daily as directed by coumadin clinic  . zolpidem (AMBIEN) 5 MG tablet Take 2.5-5 mg by mouth at bedtime as needed for sleep.      Allergies:   Prednisone; Zocor [simvastatin]; Yellow jacket venom [bee venom]; Hydrocodone; and Oxycodone hcl   Social History   Socioeconomic History    . Marital status: Married    Spouse name: Not on file  . Number of children: Not on file  . Years of education: Not on file  . Highest education level: Not on file  Occupational History  . Not on file  Social Needs  . Financial resource strain: Not on file  . Food insecurity:    Worry: Not on file    Inability: Not on file  . Transportation needs:    Medical: Not on file    Non-medical: Not on file  Tobacco Use  . Smoking status: Former Smoker    Last attempt to quit: 01/28/1958    Years since quitting: 59.7  . Smokeless tobacco: Never Used  Substance and Sexual Activity  . Alcohol use: Yes    Comment: "a few a week"  . Drug use: No  . Sexual activity: Not on file  Lifestyle  . Physical activity:    Days per week: Not on file    Minutes per session: Not on file  . Stress: Not on file  Relationships  . Social connections:    Talks on phone: Not on file    Gets together: Not on file    Attends religious service: Not on file    Active member of club or organization: Not on file    Attends meetings of clubs or organizations: Not on file    Relationship status: Not on file  Other Topics Concern  . Not on file  Social History Narrative  . Not on file     Family History: The patient's family history includes Cancer in his brother; Coronary artery disease in his brother and brother; Diabetes in his mother; Fainting in his brother; Heart attack in his brother and father; Liver cancer in his brother; Peripheral vascular disease in his father; Sudden death in his brother.  ROS:   Please see the history of present illness.    ROS  All other systems reviewed and negative.   EKGs/Labs/Other Studies Reviewed:    The following studies were reviewed today: PAP download  EKG:  EKG is not ordered today.    Recent Labs: 11/04/2016: ALT 25 08/21/2017: BUN 25; Creatinine, Ser 1.09; Magnesium 2.1; Potassium 4.7; Sodium 141   Recent Lipid Panel    Component Value Date/Time    CHOL 96 (L) 11/04/2016 0949   CHOL 101 06/17/2013 0821   TRIG 88 11/04/2016 0949   TRIG 75 06/17/2013 0821   HDL 43 11/04/2016 0949   HDL 42 06/17/2013 0821   CHOLHDL 2.2 11/04/2016 0949   CHOLHDL 2.7 09/18/2015 0902   VLDL 30 09/18/2015 0902   LDLCALC 35 11/04/2016 0949   LDLCALC 44 06/17/2013 7672  Physical Exam:    VS:  BP 108/60   Pulse 63   Ht 6\' 2"  (1.88 m)   Wt 242 lb 6.4 oz (110 kg)   SpO2 92%   BMI 31.12 kg/m     Wt Readings from Last 3 Encounters:  10/27/17 242 lb 6.4 oz (110 kg)  09/19/17 238 lb 14.4 oz (108.4 kg)  08/21/17 238 lb (108 kg)     GEN:  Well nourished, well developed in no acute distress HEENT: Normal NECK: No JVD; No carotid bruits LYMPHATICS: No lymphadenopathy CARDIAC: RRR, no murmurs, rubs, gallops RESPIRATORY:  Clear to auscultation without rales, wheezing or rhonchi  ABDOMEN: Soft, non-tender, non-distended MUSCULOSKELETAL:  No edema; No deformity  SKIN: Warm and dry NEUROLOGIC:  Alert and oriented x 3 PSYCHIATRIC:  Normal affect   ASSESSMENT:    1. OSA (obstructive sleep apnea)   2. Essential hypertension, benign    PLAN:    In order of problems listed above:  1.  OSA - the patient is tolerating PAP therapy well without any problems. The PAP download was reviewed today and showed an AHI of 2.7/hr on 7 cm H2O with 100% compliance in using more than 4 hours nightly.  The patient has been using and benefiting from PAP use and will continue to benefit from therapy.   2.  HTN - BP controlled on exam today.  He will continue on Lopressor 12.5mg  BID.     Medication Adjustments/Labs and Tests Ordered: Current medicines are reviewed at length with the patient today.  Concerns regarding medicines are outlined above.  No orders of the defined types were placed in this encounter.  No orders of the defined types were placed in this encounter.   Signed, Fransico Him, MD  10/27/2017 2:44 PM    [place]

## 2017-10-27 NOTE — Patient Instructions (Addendum)
Medication Instructions:  Your physician recommends that you continue on your current medications as directed. Please refer to the Current Medication list given to you today.  Follow-up: Recall in for around 03/19/2018  Any Other Special Instructions Will Be Listed Below (If Applicable).  If you need a refill on your cardiac medications before your next appointment, please call your pharmacy.

## 2017-10-30 ENCOUNTER — Ambulatory Visit: Payer: PPO | Admitting: Pharmacist

## 2017-10-30 DIAGNOSIS — Z5181 Encounter for therapeutic drug level monitoring: Secondary | ICD-10-CM | POA: Diagnosis not present

## 2017-10-30 DIAGNOSIS — I4819 Other persistent atrial fibrillation: Secondary | ICD-10-CM | POA: Diagnosis not present

## 2017-10-30 DIAGNOSIS — I480 Paroxysmal atrial fibrillation: Secondary | ICD-10-CM

## 2017-10-30 LAB — POCT INR: INR: 2.4 (ref 2.0–3.0)

## 2017-10-30 NOTE — Patient Instructions (Signed)
Description   Continue on same dosage of coumadin 1/2 tablet daily except 1 tablet on Mondays, Wednesdays and Fridays. Recheck INR in 6 weeks. Call us with any medication changes or concerns to # [phone] Coumadin Clinic.

## 2017-11-05 ENCOUNTER — Other Ambulatory Visit: Payer: Self-pay | Admitting: Cardiology

## 2017-11-18 DIAGNOSIS — G4733 Obstructive sleep apnea (adult) (pediatric): Secondary | ICD-10-CM | POA: Diagnosis not present

## 2017-11-26 DIAGNOSIS — B078 Other viral warts: Secondary | ICD-10-CM | POA: Diagnosis not present

## 2017-11-26 DIAGNOSIS — L570 Actinic keratosis: Secondary | ICD-10-CM | POA: Diagnosis not present

## 2017-11-26 DIAGNOSIS — D485 Neoplasm of uncertain behavior of skin: Secondary | ICD-10-CM | POA: Diagnosis not present

## 2017-12-04 ENCOUNTER — Ambulatory Visit: Payer: PPO | Admitting: Cardiology

## 2017-12-09 DIAGNOSIS — G4733 Obstructive sleep apnea (adult) (pediatric): Secondary | ICD-10-CM | POA: Diagnosis not present

## 2017-12-11 ENCOUNTER — Ambulatory Visit: Payer: PPO | Admitting: *Deleted

## 2017-12-11 DIAGNOSIS — Z5181 Encounter for therapeutic drug level monitoring: Secondary | ICD-10-CM

## 2017-12-11 DIAGNOSIS — I480 Paroxysmal atrial fibrillation: Secondary | ICD-10-CM

## 2017-12-11 DIAGNOSIS — I4819 Other persistent atrial fibrillation: Secondary | ICD-10-CM

## 2017-12-11 LAB — POCT INR: INR: 3.8 — AB (ref 2.0–3.0)

## 2017-12-11 NOTE — Patient Instructions (Signed)
Description   Skip today's dose, then Continue on same dosage of coumadin 1/2 tablet daily except 1 tablet on Mondays, Wednesdays and Fridays. Recheck INR in 2 weeks. Call us with any medication changes or concerns to # [phone] Coumadin Clinic.

## 2017-12-14 ENCOUNTER — Other Ambulatory Visit: Payer: Self-pay | Admitting: Cardiology

## 2017-12-19 DIAGNOSIS — G4733 Obstructive sleep apnea (adult) (pediatric): Secondary | ICD-10-CM | POA: Diagnosis not present

## 2017-12-22 DIAGNOSIS — J209 Acute bronchitis, unspecified: Secondary | ICD-10-CM | POA: Diagnosis not present

## 2017-12-22 DIAGNOSIS — R509 Fever, unspecified: Secondary | ICD-10-CM | POA: Diagnosis not present

## 2017-12-29 ENCOUNTER — Ambulatory Visit: Payer: PPO

## 2017-12-29 DIAGNOSIS — I480 Paroxysmal atrial fibrillation: Secondary | ICD-10-CM | POA: Diagnosis not present

## 2017-12-29 DIAGNOSIS — Z5181 Encounter for therapeutic drug level monitoring: Secondary | ICD-10-CM | POA: Diagnosis not present

## 2017-12-29 DIAGNOSIS — I4819 Other persistent atrial fibrillation: Secondary | ICD-10-CM | POA: Diagnosis not present

## 2017-12-29 LAB — POCT INR: INR: 3.7 — AB (ref 2.0–3.0)

## 2017-12-29 NOTE — Patient Instructions (Signed)
Description   Skip today's dosage of Coumadin, then start taking coumadin 1/2 tablet daily except 1 tablet on Mondays and Fridays. Recheck INR in 2 weeks. Call us with any medication changes or concerns to # [phone] [facility].

## 2018-01-12 ENCOUNTER — Ambulatory Visit: Payer: PPO | Admitting: *Deleted

## 2018-01-12 DIAGNOSIS — I4819 Other persistent atrial fibrillation: Secondary | ICD-10-CM

## 2018-01-12 DIAGNOSIS — Z5181 Encounter for therapeutic drug level monitoring: Secondary | ICD-10-CM | POA: Diagnosis not present

## 2018-01-12 DIAGNOSIS — I480 Paroxysmal atrial fibrillation: Secondary | ICD-10-CM

## 2018-01-12 LAB — POCT INR: INR: 3.4 — AB (ref 2.0–3.0)

## 2018-01-12 NOTE — Patient Instructions (Signed)
Description   Skip today's dosage of Coumadin, then start taking coumadin 1/2 tablet daily except 1 tablet on Wednesdays.  Recheck INR in 2 weeks. Call us with any medication changes or concerns to # [phone] Coumadin Clinic.

## 2018-01-18 DIAGNOSIS — G4733 Obstructive sleep apnea (adult) (pediatric): Secondary | ICD-10-CM | POA: Diagnosis not present

## 2018-01-19 DIAGNOSIS — H33312 Horseshoe tear of retina without detachment, left eye: Secondary | ICD-10-CM | POA: Diagnosis not present

## 2018-01-19 DIAGNOSIS — H35371 Puckering of macula, right eye: Secondary | ICD-10-CM | POA: Diagnosis not present

## 2018-01-19 DIAGNOSIS — H35363 Drusen (degenerative) of macula, bilateral: Secondary | ICD-10-CM | POA: Diagnosis not present

## 2018-01-19 DIAGNOSIS — H26493 Other secondary cataract, bilateral: Secondary | ICD-10-CM | POA: Diagnosis not present

## 2018-01-19 DIAGNOSIS — Z961 Presence of intraocular lens: Secondary | ICD-10-CM | POA: Diagnosis not present

## 2018-01-24 ENCOUNTER — Other Ambulatory Visit: Payer: Self-pay | Admitting: Cardiology

## 2018-01-26 ENCOUNTER — Ambulatory Visit: Payer: PPO | Admitting: *Deleted

## 2018-01-26 DIAGNOSIS — I480 Paroxysmal atrial fibrillation: Secondary | ICD-10-CM

## 2018-01-26 DIAGNOSIS — Z5181 Encounter for therapeutic drug level monitoring: Secondary | ICD-10-CM | POA: Diagnosis not present

## 2018-01-26 DIAGNOSIS — I4819 Other persistent atrial fibrillation: Secondary | ICD-10-CM

## 2018-01-26 LAB — POCT INR: INR: 2.7 (ref 2.0–3.0)

## 2018-01-26 NOTE — Patient Instructions (Signed)
Description   Continue taking coumadin 1/2 tablet daily except 1 tablet on Wednesdays.  Recheck INR in 3-4 weeks. Call us with any medication changes or concerns to # [phone] Coumadin Clinic.

## 2018-02-04 DIAGNOSIS — H26491 Other secondary cataract, right eye: Secondary | ICD-10-CM | POA: Diagnosis not present

## 2018-02-18 DIAGNOSIS — G4733 Obstructive sleep apnea (adult) (pediatric): Secondary | ICD-10-CM | POA: Diagnosis not present

## 2018-02-22 ENCOUNTER — Other Ambulatory Visit (HOSPITAL_COMMUNITY): Payer: Self-pay | Admitting: Physician Assistant

## 2018-02-22 ENCOUNTER — Other Ambulatory Visit: Payer: Self-pay | Admitting: Cardiology

## 2018-02-26 ENCOUNTER — Ambulatory Visit (INDEPENDENT_AMBULATORY_CARE_PROVIDER_SITE_OTHER): Payer: PPO | Admitting: Pharmacist

## 2018-02-26 ENCOUNTER — Encounter: Payer: Self-pay | Admitting: Internal Medicine

## 2018-02-26 ENCOUNTER — Ambulatory Visit: Payer: PPO | Admitting: Internal Medicine

## 2018-02-26 VITALS — BP 126/70 | HR 50 | Ht 74.0 in | Wt 237.0 lb

## 2018-02-26 DIAGNOSIS — I10 Essential (primary) hypertension: Secondary | ICD-10-CM

## 2018-02-26 DIAGNOSIS — Z5181 Encounter for therapeutic drug level monitoring: Secondary | ICD-10-CM | POA: Diagnosis not present

## 2018-02-26 DIAGNOSIS — I480 Paroxysmal atrial fibrillation: Secondary | ICD-10-CM | POA: Diagnosis not present

## 2018-02-26 DIAGNOSIS — I4819 Other persistent atrial fibrillation: Secondary | ICD-10-CM

## 2018-02-26 DIAGNOSIS — E7800 Pure hypercholesterolemia, unspecified: Secondary | ICD-10-CM | POA: Diagnosis not present

## 2018-02-26 LAB — POCT INR: INR: 2.2 (ref 2.0–3.0)

## 2018-02-26 NOTE — Patient Instructions (Addendum)

## 2018-02-26 NOTE — Progress Notes (Signed)
HPI Mr. Sollenberger returns today for ongoing followup of atrial fib. He is a pleasant 85 yo man with a h/o persistent atrial fib who has been maintained in NSR on dofetilide. He denies palpitations. He has not had syncope. He notes that he gets a little sleepy in the afternoon and has been napping. He also notes that his HR is slow in the low 50's much of the time while he is resting.  Allergies  Allergen Reactions  . Prednisone Other (See Comments)    anxiety  . Zocor [Simvastatin] Other (See Comments)    Muscle aches  . Yellow Jacket Venom [Bee Venom] Swelling    Swelling and raised bumps at site  . Hydrocodone Rash  . Oxycodone Hcl Rash     Current Outpatient Medications  Medication Sig Dispense Refill  . dofetilide (TIKOSYN) 250 MCG capsule TAKE 1 CAPSULE(250 MCG) BY MOUTH TWICE DAILY 180 capsule 2  . ezetimibe (ZETIA) 10 MG tablet TAKE 1 TABLET(10 MG) BY MOUTH DAILY 90 tablet 3  . fluticasone (FLONASE) 50 MCG/ACT nasal spray Place 1 spray into both nostrils daily as needed for allergies or rhinitis.    . magnesium oxide (MAG-OX) 400 MG tablet Take 1 tablet (400 mg total) by mouth daily. 90 tablet 1  . metoprolol tartrate (LOPRESSOR) 25 MG tablet TAKE 1 TABLET BY MOUTH TWICE DAILY 180 tablet 0  . nitroGLYCERIN (NITROSTAT) 0.4 MG SL tablet Place 1 tablet (0.4 mg total) under the tongue every 5 (five) minutes as needed for chest pain. 25 tablet 5  . omeprazole (PRILOSEC) 40 MG capsule Take 40 mg by mouth daily as needed (heartburn).    . rosuvastatin (CRESTOR) 5 MG tablet TAKE 1 TABLET(5 MG) BY MOUTH DAILY 90 tablet 2  . tamsulosin (FLOMAX) 0.4 MG CAPS capsule Take 0.4 mg by mouth daily.    . vitamin B-12 (CYANOCOBALAMIN) 1000 MCG tablet Take 1,000 mcg by mouth every 7 (seven) days.     Marland Kitchen warfarin (COUMADIN) 5 MG tablet TAKE 1/2 TO 1 TABLET BY MOUTH DAILY AS DIRECTED 70 tablet 1  . zolpidem (AMBIEN) 5 MG tablet Take 2.5-5 mg by mouth at bedtime as needed for sleep.      No  current facility-administered medications for this visit.    Facility-Administered Medications Ordered in Other Visits  Medication Dose Route Frequency Provider Last Rate Last Dose  . 0.9 %  sodium chloride infusion    Continuous PRN Lowella Dell, CRNA         Past Medical History:  Diagnosis Date  . Aortic stenosis    mild by echo 09/2017  . Coronary disease    2 Vessel,inferior wall MI june 2003, PTCA and Stent RCA June 2003, PTCA stent, Left Circumflex OM 02 Mar 2003, Dr Radford Pax  . Dilated aortic root ([place])    68mm by echo 09/2016  . DJD (degenerative joint disease) of knee    LEFT  . Dyslipidemia   . GERD (gastroesophageal reflux disease)   . History of rotator cuff syndrome    (Right) with biceps tendinitis  . Hypertension   . Obesity   . OSA (obstructive sleep apnea)    presented with HA and drowsiness,mild sleep apnea with oxygen desats to 78%  . Persistent atrial fibrillation   . Prostate cancer ([place])    /BPH, radioactive seed 2007, Nesi   . Visit for monitoring Tikosyn therapy 08/12/2017    ROS:   All systems reviewed and negative except  as noted in the HPI.   Past Surgical History:  Procedure Laterality Date  . CARDIAC CATHETERIZATION    . CARDIOVERSION N/A 08/27/2013   Procedure: CARDIOVERSION;  Surgeon: Sueanne Margarita, MD;  Location: [facility] ENDOSCOPY;  Service: Cardiovascular;  Laterality: N/A;  . CARDIOVERSION N/A 03/15/2016   Procedure: CARDIOVERSION;  Surgeon: Dorothy Spark, MD;  Location: [place];  Service: Cardiovascular;  Laterality: N/A;  . CARDIOVERSION N/A 10/30/2016   Procedure: CARDIOVERSION;  Surgeon: Jerline Pain, MD;  Location: [facility] ENDOSCOPY;  Service: Cardiovascular;  Laterality: N/A;  . CORONARY ANGIOPLASTY    . JOINT REPLACEMENT     left knee replacement 2009  . TEE WITHOUT CARDIOVERSION N/A 10/30/2016   Procedure: TRANSESOPHAGEAL ECHOCARDIOGRAM (TEE);  Surgeon: Jerline Pain, MD;  Location: [facility] ENDOSCOPY;  Service: Cardiovascular;   Laterality: N/A;     Family History  Problem Relation Age of Onset  . Diabetes Mother   . Peripheral vascular disease Father   . Heart attack Father   . Coronary artery disease Brother   . Liver cancer Brother   . Coronary artery disease Brother   . Heart attack Brother   . Cancer Brother   . Sudden death Brother   . Fainting Brother      Social History   Socioeconomic History  . Marital status: Married    Spouse name: Not on file  . Number of children: Not on file  . Years of education: Not on file  . Highest education level: Not on file  Occupational History  . Not on file  Social Needs  . Financial resource strain: Not on file  . Food insecurity:    Worry: Not on file    Inability: Not on file  . Transportation needs:    Medical: Not on file    Non-medical: Not on file  Tobacco Use  . Smoking status: Former Smoker    Last attempt to quit: 01/28/1958    Years since quitting: 60.1  . Smokeless tobacco: Never Used  Substance and Sexual Activity  . Alcohol use: Yes    Comment: "a few a week"  . Drug use: No  . Sexual activity: Not on file  Lifestyle  . Physical activity:    Days per week: Not on file    Minutes per session: Not on file  . Stress: Not on file  Relationships  . Social connections:    Talks on phone: Not on file    Gets together: Not on file    Attends religious service: Not on file    Active member of club or organization: Not on file    Attends meetings of clubs or organizations: Not on file    Relationship status: Not on file  . Intimate partner violence:    Fear of current or ex partner: Not on file    Emotionally abused: Not on file    Physically abused: Not on file    Forced sexual activity: Not on file  Other Topics Concern  . Not on file  Social History Narrative  . Not on file     BP 126/70   Pulse (!) 50   Ht 6\' 2"  (1.88 m)   Wt 237 lb (107.5 kg)   SpO2 98%   BMI 30.43 kg/m   Physical Exam:  Well appearing  NAD HEENT: Unremarkable Neck:  No JVD, no thyromegally Lymphatics:  No adenopathy Back:  No CVA tenderness Lungs:  Clear with no wheezes HEART:  Regular  rate rhythm, no murmurs, no rubs, no clicks Abd:  soft, positive bowel sounds, no organomegally, no rebound, no guarding Ext:  2 plus pulses, no edema, no cyanosis, no clubbing Skin:  No rashes no nodules Neuro:  CN II through XII intact, motor grossly intact  EKG - nsr with a QTC of 470   Assess/Plan: 1. Persistent atrial fib - he is maintaining NSR. He will continue dofetilide. 2. Sinus node dysfunction - he goes slow but is asymptomatic. He is only taking 12.5 mg twice daily of metoprolol. We discussed stopping this but will continue for now. 3. Sleep apnea - he is followed by Dr. Radford Pax.  4. AS - this is mild by echo. We will follow.  Mikle Bosworth.D.

## 2018-02-26 NOTE — Patient Instructions (Signed)
Description   Continue taking coumadin 1/2 tablet daily except 1 tablet on Wednesdays.  Recheck INR in 4 weeks. Call us with any medication changes or concerns to # [phone] [facility].

## 2018-03-05 DIAGNOSIS — N400 Enlarged prostate without lower urinary tract symptoms: Secondary | ICD-10-CM | POA: Diagnosis not present

## 2018-03-05 DIAGNOSIS — E7800 Pure hypercholesterolemia, unspecified: Secondary | ICD-10-CM | POA: Diagnosis not present

## 2018-03-05 DIAGNOSIS — K219 Gastro-esophageal reflux disease without esophagitis: Secondary | ICD-10-CM | POA: Diagnosis not present

## 2018-03-05 DIAGNOSIS — G4733 Obstructive sleep apnea (adult) (pediatric): Secondary | ICD-10-CM | POA: Diagnosis not present

## 2018-03-05 DIAGNOSIS — Z1389 Encounter for screening for other disorder: Secondary | ICD-10-CM | POA: Diagnosis not present

## 2018-03-05 DIAGNOSIS — I10 Essential (primary) hypertension: Secondary | ICD-10-CM | POA: Diagnosis not present

## 2018-03-05 DIAGNOSIS — Z8546 Personal history of malignant neoplasm of prostate: Secondary | ICD-10-CM | POA: Diagnosis not present

## 2018-03-05 DIAGNOSIS — Z0000 Encounter for general adult medical examination without abnormal findings: Secondary | ICD-10-CM | POA: Diagnosis not present

## 2018-03-05 DIAGNOSIS — M79604 Pain in right leg: Secondary | ICD-10-CM | POA: Diagnosis not present

## 2018-03-05 DIAGNOSIS — I2510 Atherosclerotic heart disease of native coronary artery without angina pectoris: Secondary | ICD-10-CM | POA: Diagnosis not present

## 2018-03-10 DIAGNOSIS — E7800 Pure hypercholesterolemia, unspecified: Secondary | ICD-10-CM

## 2018-03-10 NOTE — Telephone Encounter (Signed)
Left a message to call back.

## 2018-03-11 NOTE — Telephone Encounter (Signed)
Spoke with the patient, he expressed understanding about his referral to lipid clinic.  Sending message to [facility].

## 2018-03-18 ENCOUNTER — Ambulatory Visit (INDEPENDENT_AMBULATORY_CARE_PROVIDER_SITE_OTHER): Payer: PPO | Admitting: Pharmacist

## 2018-03-18 DIAGNOSIS — E7800 Pure hypercholesterolemia, unspecified: Secondary | ICD-10-CM | POA: Diagnosis not present

## 2018-03-18 MED ORDER — ROSUVASTATIN CALCIUM 5 MG PO TABS: 5.0000 mg | ORAL_TABLET | ORAL | 3 refills | Status: DC

## 2018-03-18 NOTE — Patient Instructions (Addendum)
Decrease your Crestor to 5mg  Monday, Wednesday and Friday (three times a week).  Recheck lab-work on 05/22/18  Call us at [phone] with any questions and concerns.

## 2018-03-18 NOTE — Progress Notes (Signed)
Patient ID: CINCERE ZORN                 DOB: 08/16/1932                    MRN: [id number]     HPI: Randall Chen is a 85 y.o. male patient referred to lipid clinic by Dr. Radford Pax. PMH is significant for afib, CAD s/p MI 2003, s/p stent 2003, 2005, HLD and HTN.  Patient presents today to discuss treatment options for his cholesterol. He has been on rosuvastatin 5mg  daily for many years along with zetia 10mg  daily. He recently has had mild muscle pains in his arm and calf. Primary care doctor could not figure out a cause and thought it might be from his statin. Pain is occassional, he actually has not had any in one week. It is only on his right side.  Current Medications: rosuvastatin 5mg  daily, zetia 10mg  daily Intolerances: rosuvastatin 5mg  daily?, simvastatin (muscle pains) Risk Factors: CAD LDL goal: <70  Diet: eats bread, doesn't eat many sweets. Drinks alcohol around the holidays, but then no alcohol until May  Social History: former smoker, occassional ETOH use  Labs:  03/05/2018 TC 90, TG 71, HDL 39, LDL 36 (rosuvastatin 5mg  daily, zetia 10mg  daily) 11/04/16 TC 96, TG 88, HDL 43, LDL 35 (rosuvastatin 5mg  daily, zetia 10mg  daily)  Past Medical History:  Diagnosis Date  . Aortic stenosis    mild by echo 09/2017  . Coronary disease    2 Vessel,inferior wall MI june 2003, PTCA and Stent RCA June 2003, PTCA stent, Left Circumflex OM 02 Mar 2003, Dr Radford Pax  . Dilated aortic root ([place])    65mm by echo 09/2016  . DJD (degenerative joint disease) of knee    LEFT  . Dyslipidemia   . GERD (gastroesophageal reflux disease)   . History of rotator cuff syndrome    (Right) with biceps tendinitis  . Hypertension   . Obesity   . OSA (obstructive sleep apnea)    presented with HA and drowsiness,mild sleep apnea with oxygen desats to 78%  . Persistent atrial fibrillation   . Prostate cancer ([place])    /BPH, radioactive seed 2007, [facility]   . Visit for monitoring Tikosyn therapy 08/12/2017     Current Outpatient Medications on File Prior to Visit  Medication Sig Dispense Refill  . dofetilide (TIKOSYN) 250 MCG capsule TAKE 1 CAPSULE(250 MCG) BY MOUTH TWICE DAILY 180 capsule 2  . ezetimibe (ZETIA) 10 MG tablet TAKE 1 TABLET(10 MG) BY MOUTH DAILY 90 tablet 3  . fluticasone (FLONASE) 50 MCG/ACT nasal spray Place 1 spray into both nostrils daily as needed for allergies or rhinitis.    . magnesium oxide (MAG-OX) 400 MG tablet Take 1 tablet (400 mg total) by mouth daily. 90 tablet 1  . metoprolol tartrate (LOPRESSOR) 25 MG tablet TAKE 1 TABLET BY MOUTH TWICE DAILY 180 tablet 0  . nitroGLYCERIN (NITROSTAT) 0.4 MG SL tablet Place 1 tablet (0.4 mg total) under the tongue every 5 (five) minutes as needed for chest pain. 25 tablet 5  . omeprazole (PRILOSEC) 40 MG capsule Take 40 mg by mouth daily as needed (heartburn).    . rosuvastatin (CRESTOR) 5 MG tablet TAKE 1 TABLET(5 MG) BY MOUTH DAILY 90 tablet 2  . tamsulosin (FLOMAX) 0.4 MG CAPS capsule Take 0.4 mg by mouth daily.    . vitamin B-12 (CYANOCOBALAMIN) 1000 MCG tablet Take 1,000 mcg by mouth every 7 (seven)  days.     . warfarin (COUMADIN) 5 MG tablet TAKE 1/2 TO 1 TABLET BY MOUTH DAILY AS DIRECTED 70 tablet 1  . zolpidem (AMBIEN) 5 MG tablet Take 2.5-5 mg by mouth at bedtime as needed for sleep.      Current Facility-Administered Medications on File Prior to Visit  Medication Dose Route Frequency Provider Last Rate Last Dose  . 0.9 %  sodium chloride infusion    Continuous PRN Lowella Dell, CRNA        Allergies  Allergen Reactions  . Prednisone Other (See Comments)    anxiety  . Zocor [Simvastatin] Other (See Comments)    Muscle aches  . Yellow Jacket Venom [Bee Venom] Swelling    Swelling and raised bumps at site  . Hydrocodone Rash  . Oxycodone Hcl Rash    Assessment/Plan:  1. Hyperlipidemia - Patient LDL is at goal of <70. Discussed options with patient. We could try stopping medication for 1-2 months to see if  pain goes away, if it does, then we can conclude that it was most likely the medication. At that time we could restart rosuvastatin at a lower dose or could try pravastatin. Or we could just lower the Crestor dose to three times a week and see if pain improves. Patient states that the pain is mild and not all that bothersome. He would prefer to lower the dose. Will change rosuvastatin to 5mg  three times a week and recheck labwork in about 3 months prior to his visit with Dr. Radford Pax.  Thank you,  Ramond Dial, Pharm.D, [facility]  [address], [place], [place] [address]  Phone: [phone]; Fax: [phone]

## 2018-03-21 DIAGNOSIS — G4733 Obstructive sleep apnea (adult) (pediatric): Secondary | ICD-10-CM | POA: Diagnosis not present

## 2018-03-27 ENCOUNTER — Ambulatory Visit (INDEPENDENT_AMBULATORY_CARE_PROVIDER_SITE_OTHER): Payer: PPO

## 2018-03-27 DIAGNOSIS — I480 Paroxysmal atrial fibrillation: Secondary | ICD-10-CM | POA: Diagnosis not present

## 2018-03-27 DIAGNOSIS — I4819 Other persistent atrial fibrillation: Secondary | ICD-10-CM | POA: Diagnosis not present

## 2018-03-27 DIAGNOSIS — Z5181 Encounter for therapeutic drug level monitoring: Secondary | ICD-10-CM | POA: Diagnosis not present

## 2018-03-27 LAB — POCT INR: INR: 2.1 (ref 2.0–3.0)

## 2018-03-27 NOTE — Patient Instructions (Signed)
Description   Continue taking coumadin 1/2 tablet daily except 1 tablet on Wednesdays.  Recheck INR in 6 weeks. Call us with any medication changes or concerns to # [phone] [facility].

## 2018-04-15 DIAGNOSIS — Z8546 Personal history of malignant neoplasm of prostate: Secondary | ICD-10-CM | POA: Diagnosis not present

## 2018-04-15 DIAGNOSIS — I2510 Atherosclerotic heart disease of native coronary artery without angina pectoris: Secondary | ICD-10-CM | POA: Diagnosis not present

## 2018-04-15 DIAGNOSIS — I4819 Other persistent atrial fibrillation: Secondary | ICD-10-CM | POA: Diagnosis not present

## 2018-04-15 DIAGNOSIS — I10 Essential (primary) hypertension: Secondary | ICD-10-CM | POA: Diagnosis not present

## 2018-04-15 DIAGNOSIS — N400 Enlarged prostate without lower urinary tract symptoms: Secondary | ICD-10-CM | POA: Diagnosis not present

## 2018-04-15 DIAGNOSIS — M159 Polyosteoarthritis, unspecified: Secondary | ICD-10-CM | POA: Diagnosis not present

## 2018-04-19 DIAGNOSIS — G4733 Obstructive sleep apnea (adult) (pediatric): Secondary | ICD-10-CM | POA: Diagnosis not present

## 2018-05-07 ENCOUNTER — Telehealth: Payer: Self-pay | Admitting: *Deleted

## 2018-05-07 ENCOUNTER — Telehealth: Payer: Self-pay

## 2018-05-07 NOTE — Telephone Encounter (Signed)
lmom for prescreen/drive thru 

## 2018-05-07 NOTE — Telephone Encounter (Signed)

## 2018-05-08 ENCOUNTER — Other Ambulatory Visit: Payer: Self-pay

## 2018-05-08 ENCOUNTER — Ambulatory Visit (INDEPENDENT_AMBULATORY_CARE_PROVIDER_SITE_OTHER): Payer: PPO | Admitting: Pharmacist

## 2018-05-08 DIAGNOSIS — Z5181 Encounter for therapeutic drug level monitoring: Secondary | ICD-10-CM | POA: Diagnosis not present

## 2018-05-08 DIAGNOSIS — I4819 Other persistent atrial fibrillation: Secondary | ICD-10-CM

## 2018-05-08 DIAGNOSIS — I480 Paroxysmal atrial fibrillation: Secondary | ICD-10-CM

## 2018-05-08 LAB — POCT INR: INR: 2 (ref 2.0–3.0)

## 2018-05-08 MED ORDER — APIXABAN 5 MG PO TABS: 5.0000 mg | ORAL_TABLET | Freq: Two times a day (BID) | ORAL | 0 refills | Status: DC

## 2018-05-08 NOTE — Progress Notes (Signed)
Pt was started on Eliquis 5mg  BID for afib on April 11th, 2020.   Reviewed patients medication list.  Pt is not currently on any combined P-gp and strong CYP3A4 inhibitors/inducers (ketoconazole, traconazole, ritonavir, carbamazepine, phenytoin, rifampin, St. John's wort).  Reviewed labs.  SCr 1.09, Weight 107.5kg.  Dose is appropriate based on age, weight, and SCr.    A full discussion of the nature of anticoagulants has been carried out.  A benefit/risk analysis has been presented to the patient, so that they understand the justification for choosing anticoagulation with Eliquis at this time.  The need for compliance is stressed.  Pt is aware to take the medication twice daily.  Side effects of potential bleeding are discussed, including unusual colored urine or stools, coughing up blood or coffee ground emesis, nose bleeds or serious fall or head trauma.  Discussed signs and symptoms of stroke. The patient should avoid any OTC items containing aspirin or ibuprofen.  Avoid alcohol consumption.   Call if any signs of abnormal bleeding.  Discussed financial obligations and resolved any difficulty in obtaining medication.

## 2018-05-20 DIAGNOSIS — G4733 Obstructive sleep apnea (adult) (pediatric): Secondary | ICD-10-CM | POA: Diagnosis not present

## 2018-05-21 ENCOUNTER — Telehealth: Payer: Self-pay | Admitting: Cardiology

## 2018-05-21 ENCOUNTER — Telehealth: Payer: Self-pay | Admitting: Pharmacist Clinician (PhC)/ Clinical Pharmacy Specialist

## 2018-05-21 MED ORDER — DOFETILIDE 250 MCG PO CAPS: ORAL_CAPSULE | ORAL | 3 refills | Status: DC

## 2018-05-21 NOTE — Telephone Encounter (Signed)
Patient called to let us know that he got his first 3 month supply of Eliquis and he is now in the donut hole. He does not want to pay the 37% cost at that time.  He also has a 30 day free supply, so has 4 months of Eliquis at home.  He would like to transition back to warfarin once his supply is out.    Asked that he call our clinic when he is down to a 2-3 week supply and we will discuss how to convert back to warfarin.  Patient agreeable and will call back in about 3-3.5 months.

## 2018-05-21 NOTE — Telephone Encounter (Signed)
The patient is in the donut hole and requested his prescription be sent to [facility] instead. Orders sent.

## 2018-05-21 NOTE — Telephone Encounter (Signed)
New message:  Patient  Calling concerning his medications that he is taking. Please call patient back.

## 2018-05-21 NOTE — Telephone Encounter (Signed)
Video/doxy.me/smartphone/verbal consent 05/21/18/vitals  YOUR CARDIOLOGY TEAM HAS ARRANGED FOR AN E-VISIT FOR YOUR APPOINTMENT - PLEASE REVIEW IMPORTANT INFORMATION BELOW SEVERAL DAYS PRIOR TO YOUR APPOINTMENT  Due to the recent COVID-19 pandemic, we are transitioning in-person office visits to tele-medicine visits in an effort to decrease unnecessary exposure to our patients, their families, and staff. These visits are billed to your insurance just like a normal visit is. We also encourage you to sign up for MyChart if you have not already done so. You will need a smartphone if possible. For patients that do not have this, we can still complete the visit using a regular telephone but do prefer a smartphone to enable video when possible. You may have a family member that lives with you that can help. If possible, we also ask that you have a blood pressure cuff and scale at home to measure your blood pressure, heart rate and weight prior to your scheduled appointment. Patients with clinical needs that need an in-person evaluation and testing will still be able to come to the office if absolutely necessary. If you have any questions, feel free to call our office.      YOUR PROVIDER WILL BE USING THE FOLLOWING PLATFORM TO COMPLETE YOUR VISIT:   [email or web address]    F USING MYCHART - How to Download the MyChart App to Your SmartPhone   - If Apple, go to [employer] and type in MyChart in the search bar and download the app. If Android, ask patient to go to [employer] and type in [facility] in the search bar and download the app. The app is free but as with any other app downloads, your phone may require you to verify saved payment information or Apple/Android password.  - You will need to then log into the app with your MyChart username and password, and select [facility] as your healthcare provider to link the account.  - When it is time for your visit, go to the MyChart app, find appointments, and click  Begin Video Visit. Be sure to Select Allow for your device to access the Microphone and Camera for your visit. You will then be connected, and your provider will be with you shortly.  **If you have any issues connecting or need assistance, please contact MyChart service desk [phone])**  **If using a computer, in order to ensure the best quality for your visit, you will need to use either of the following Internet Browsers: Insurance underwriter or Microsoft Edge**   IF USING DOXIMITY or [email or web address] - The staff will give you instructions on receiving your link to join the meeting the day of your visit.      2-3 DAYS BEFORE YOUR APPOINTMENT  You will receive a telephone call from one of our [facility] team members - your caller ID may say "Unknown caller." If this is a video visit, we will walk you through how to get the video launched on your phone. We will remind you check your blood pressure, heart rate and weight prior to your scheduled appointment. If you have an Apple Watch or Kardia, please upload any pertinent ECG strips the day before or morning of your appointment to [facility]. Our staff will also make sure you have reviewed the consent and agree to move forward with your scheduled tele-health visit.     THE DAY OF YOUR APPOINTMENT  Approximately 15 minutes prior to your scheduled appointment, you will receive a telephone call from one of [facility] team -  your caller ID may say "Unknown caller."  Our staff will confirm medications, vital signs for the day and any symptoms you may be experiencing. Please have this information available prior to the time of visit start. It may also be helpful for you to have a pad of paper and pen handy for any instructions given during your visit. They will also walk you through joining the smartphone meeting if this is a video visit.    CONSENT FOR TELE-HEALTH VISIT - PLEASE REVIEW  I hereby voluntarily request, consent and authorize [facility] and its employed or contracted physicians, physician assistants, nurse practitioners or other licensed health care professionals (the Practitioner), to provide me with telemedicine health care services (the Services") as deemed necessary by the treating Practitioner. I acknowledge and consent to receive the Services by the Practitioner via telemedicine. I understand that the telemedicine visit will involve communicating with the Practitioner through live audiovisual communication technology and the disclosure of certain medical information by electronic transmission. I acknowledge that I have been given the opportunity to request an in-person assessment or other available alternative prior to the telemedicine visit and am voluntarily participating in the telemedicine visit.  I understand that I have the right to withhold or withdraw my consent to the use of telemedicine in the course of my care at any time, without affecting my right to future care or treatment, and that the Practitioner or I may terminate the telemedicine visit at any time. I understand that I have the right to inspect all information obtained and/or recorded in the course of the telemedicine visit and may receive copies of available information for a reasonable fee.  I understand that some of the potential risks of receiving the Services via telemedicine include:   Delay or interruption in medical evaluation due to technological equipment failure or disruption;  Information transmitted may not be sufficient (e.g. poor resolution of images) to allow for appropriate medical decision making by the Practitioner; and/or   In rare instances, security protocols could fail, causing a breach of personal health information.  Furthermore, I acknowledge that it is my responsibility to provide information about my medical history, conditions and care that is complete and accurate to the best of my ability. I acknowledge that Practitioner's  advice, recommendations, and/or decision may be based on factors not within their control, such as incomplete or inaccurate data provided by me or distortions of diagnostic images or specimens that may result from electronic transmissions. I understand that the practice of medicine is not an exact science and that Practitioner makes no warranties or guarantees regarding treatment outcomes. I acknowledge that I will receive a copy of this consent concurrently upon execution via email to the email address I last provided but may also request a printed copy by calling the office of [facility].    I understand that my insurance will be billed for this visit.   I have read or had this consent read to me.  I understand the contents of this consent, which adequately explains the benefits and risks of the Services being provided via telemedicine.   I have been provided ample opportunity to ask questions regarding this consent and the Services and have had my questions answered to my satisfaction.  I give my informed consent for the services to be provided through the use of telemedicine in my medical care  By participating in this telemedicine visit I agree to the above.

## 2018-05-22 ENCOUNTER — Other Ambulatory Visit: Payer: PPO

## 2018-05-24 DIAGNOSIS — E669 Obesity, unspecified: Secondary | ICD-10-CM | POA: Insufficient documentation

## 2018-05-24 NOTE — Progress Notes (Signed)
Virtual Visit via Video Note   This visit type was conducted due to national recommendations for restrictions regarding the COVID-19 Pandemic (e.g. social distancing) in an effort to limit this patient's exposure and mitigate transmission in our community.  Due to his co-morbid illnesses, this patient is at least at moderate risk for complications without adequate follow up.  This format is felt to be most appropriate for this patient at this time.  All issues noted in this document were discussed and addressed.  A limited physical exam was performed with this format.  Please refer to the patient's chart for his consent to telehealth for [facility].    Evaluation Performed:  Follow-up visit  This visit type was conducted due to national recommendations for restrictions regarding the COVID-19 Pandemic (e.g. social distancing).  This format is felt to be most appropriate for this patient at this time.  All issues noted in this document were discussed and addressed.  No physical exam was performed (except for noted visual exam findings with Video Visits).  Please refer to the patient's chart (MyChart message for video visits and phone note for telephone visits) for the patient's consent to telehealth for [facility].  Date:  05/25/2018   ID:  Randall Chen, DOB [birth year], MRN [id number]  Patient Location:  Home  Provider location:   [place]  PCP:  Lavone Orn, MD  Sleep Medicine: Fransico Him, MD   Electrophysiologist: Cristopher Peru, MD  Chief Complaint:  OSA, HTN  History of Present Illness:    Randall Chen is a 85 y.o. male who presents via audio/video conferencing for a telehealth visit today.    Randall Chen is an 85 y.o. male with a hx of  OSA on CPAP and HTN.  Norm He is doing well with his CPAP device and thinks that he has gotten used to it.  He tolerates the mask and feels the pressure is adequate.  Since going on CPAP he feels rested in the am and has no  significant daytime sleepiness.  He denies any significant mouth or nasal dryness or nasal congestion.  He does not think that he snores.    He also has a hx of ASCAD s/p inferior MI 2003 with PCI of RCA 6/03 and then PCI of left circ/OM 2/05, dyslipidemia, bicuspid AVwith mild AS, mildly dilated aorta and PAF followed by Dr. Lovena Le.  He had a reoccurrence of PAF in Feb 2018 and underwent successful DCCV to NSR. He was seen in the afib clinic 10/14/2016 with complaints of palpitations for 1 week after a gambling weekend in [place] where he drank more ETOH than usual. Due to subtherapeutic INR, he underwent TEE/DCCV to NSR on 10/30/2016. He was seen back in afib clinic in Feb 2019 with recurrent afib.  ADD therapy was discussed with only option being Tikosyn.  Is has bradycardia so Sotolol was not an option.  He was subsequently loaded on dofetilide.  He has been doing well and maintaining sinus rhythm on this.  The patient does not have symptoms concerning for COVID-19 infection (fever, chills, cough, or new shortness of breath).    Prior CV studies:   The following studies were reviewed today:  PAP compliance download  Past Medical History:  Diagnosis Date  . Aortic stenosis    mild by echo 09/2017  . Coronary disease    2 Vessel,inferior wall MI june 2003, PTCA and Stent RCA June 2003, PTCA stent, Left Circumflex OM 02 Mar 2003,  Dr Radford Pax  . Dilated aortic root ([place])    51mm by echo 09/2016  . DJD (degenerative joint disease) of knee    LEFT  . Dyslipidemia   . GERD (gastroesophageal reflux disease)   . History of rotator cuff syndrome    (Right) with biceps tendinitis  . Hypertension   . Obesity   . OSA (obstructive sleep apnea)    presented with HA and drowsiness,mild sleep apnea with oxygen desats to 78%  . Persistent atrial fibrillation   . Prostate cancer ([place])    /BPH, radioactive seed 2007, Nesi   . Visit for monitoring Tikosyn therapy 08/12/2017   Past Surgical History:    Procedure Laterality Date  . CARDIAC CATHETERIZATION    . CARDIOVERSION N/A 08/27/2013   Procedure: CARDIOVERSION;  Surgeon: Sueanne Margarita, MD;  Location: [facility] ENDOSCOPY;  Service: Cardiovascular;  Laterality: N/A;  . CARDIOVERSION N/A 03/15/2016   Procedure: CARDIOVERSION;  Surgeon: Dorothy Spark, MD;  Location: [place];  Service: Cardiovascular;  Laterality: N/A;  . CARDIOVERSION N/A 10/30/2016   Procedure: CARDIOVERSION;  Surgeon: Jerline Pain, MD;  Location: [facility] ENDOSCOPY;  Service: Cardiovascular;  Laterality: N/A;  . CORONARY ANGIOPLASTY    . JOINT REPLACEMENT     left knee replacement 2009  . TEE WITHOUT CARDIOVERSION N/A 10/30/2016   Procedure: TRANSESOPHAGEAL ECHOCARDIOGRAM (TEE);  Surgeon: Jerline Pain, MD;  Location: [facility] ENDOSCOPY;  Service: Cardiovascular;  Laterality: N/A;     Current Meds  Medication Sig  . apixaban (ELIQUIS) 5 MG TABS tablet Take 1 tablet (5 mg total) by mouth 2 (two) times daily.  Marland Kitchen dofetilide (TIKOSYN) 250 MCG capsule TAKE 1 CAPSULE(250 MCG) BY MOUTH TWICE DAILY  . ezetimibe (ZETIA) 10 MG tablet TAKE 1 TABLET(10 MG) BY MOUTH DAILY  . fluticasone (FLONASE) 50 MCG/ACT nasal spray Place 1 spray into both nostrils daily as needed for allergies or rhinitis.  . magnesium oxide (MAG-OX) 400 MG tablet Take 1 tablet (400 mg total) by mouth daily.  . metoprolol tartrate (LOPRESSOR) 25 MG tablet Take 12.5 mg by mouth 2 (two) times daily.  . nitroGLYCERIN (NITROSTAT) 0.4 MG SL tablet Place 1 tablet (0.4 mg total) under the tongue every 5 (five) minutes as needed for chest pain.  Marland Kitchen omeprazole (PRILOSEC) 40 MG capsule Take 40 mg by mouth daily as needed (heartburn).  . rosuvastatin (CRESTOR) 5 MG tablet Take 1 tablet (5 mg total) by mouth 3 (three) times a week.  . tamsulosin (FLOMAX) 0.4 MG CAPS capsule Take 0.4 mg by mouth daily.  . vitamin B-12 (CYANOCOBALAMIN) 1000 MCG tablet Take 1,000 mcg by mouth every 7 (seven) days.   Marland Kitchen zolpidem (AMBIEN) 5 MG tablet  Take 2.5-5 mg by mouth at bedtime as needed for sleep.      Allergies:   Prednisone; Zocor [simvastatin]; Yellow jacket venom [bee venom]; Hydrocodone; and Oxycodone hcl   Social History   Tobacco Use  . Smoking status: Former Smoker    Last attempt to quit: 01/28/1958    Years since quitting: 60.3  . Smokeless tobacco: Never Used  Substance Use Topics  . Alcohol use: Yes    Comment: "a few a week"  . Drug use: No     Family Hx: The patient's family history includes Cancer in his brother; Coronary artery disease in his brother and brother; Diabetes in his mother; Fainting in his brother; Heart attack in his brother and father; Liver cancer in his brother; Peripheral vascular disease in his father;  Sudden death in his brother.  ROS:   Please see the history of present illness.     All other systems reviewed and are negative.   Labs/Other Tests and Data Reviewed:    Recent Labs: 08/21/2017: BUN 25; Creatinine, Ser 1.09; Magnesium 2.1; Potassium 4.7; Sodium 141   Recent Lipid Panel Lab Results  Component Value Date/Time   CHOL 96 (L) 11/04/2016 09:49 AM   CHOL 101 06/17/2013 08:21 AM   TRIG 88 11/04/2016 09:49 AM   TRIG 75 06/17/2013 08:21 AM   HDL 43 11/04/2016 09:49 AM   HDL 42 06/17/2013 08:21 AM   CHOLHDL 2.2 11/04/2016 09:49 AM   CHOLHDL 2.7 09/18/2015 09:02 AM   LDLCALC 35 11/04/2016 09:49 AM   LDLCALC 44 06/17/2013 08:21 AM    Wt Readings from Last 3 Encounters:  02/26/18 237 lb (107.5 kg)  10/27/17 242 lb 6.4 oz (110 kg)  09/19/17 238 lb 14.4 oz (108.4 kg)  Had a abdomen had a day without a skin is him off the phone with what his jugular veins been it does give me a few minutes ago patellofemoral crack but I  Objective:    Vital Signs:  BP 133/64   Pulse 63   Ht 6\' 2"  (1.88 m)   BMI 30.43 kg/m    CONSTITUTIONAL:  Well nourished, well developed male in no acute distress.  EYES: anicteric MOUTH: oral mucosa is pink RESPIRATORY: Normal respiratory  effort, symmetric expansion CARDIOVASCULAR: No peripheral edema SKIN: No rash, lesions or ulcers MUSCULOSKELETAL: no digital cyanosis NEURO: Cranial Nerves II-XII grossly intact, moves all extremities PSYCH: Intact judgement and insight.  A&O x 3, Mood/affect appropriate   ASSESSMENT & PLAN:    1.  OSA - the patient is tolerating PAP therapy well without any problems. The PAP download was reviewed today and showed an AHI of 2.1/hr on 7 cm H2O with 100% compliance in using more than 4 hours nightly.  The patient has been using and benefiting from PAP use and will continue to benefit from therapy.   2.  HTN -his blood pressure is well controlled.  He will  continue on Lopressor 12.5 mg twice daily  3.  Obesity - I have encouraged hime to get into a routine exercise program and cut back on carbs and portions.   4.  ASCAD - s/p inferior MI 2003 with PCI of RCA 6/03 and then PCI of left circ/OM 2/05.  He has not had any anginal symptoms.  Continue on beta-blocker and statin.  He is not on aspirin due to apixaban.  5.  Mild AS - his last 2D echo 10/08/2017 showed mild aortic stenosis with mean aortic valve gradient 10 mmHg.  His LV function was normal.  6.  Hyperlipidemia - LDL goal is less than 70.  Last LDL was 36 on 03/05/2018.  Continue on Crestor 5 mg 3 times weekly and Zetia 10 mg daily.  I will repeat an FLP and ALT in September since he has decreased his statin to 3 times weekly due to body aches.  I do not want to check it now due to the COVID-19 crisis.  7.  Paroxysmal atrial fibrillation -he has been maintaining sinus rhythm after loading with Tikosyn.  He has not had any palpitations.  He denies any bleeding problems.  He will continue on Tikosyn 250 mcg twice daily and Lopressor 12.5 mg twice daily.  Unfortunately the cost of apixaban is prohibitive due to getting in the donut hole  very quickly.  Once he has gone down to only a weeks worth left of Eliquis he is going to call our Pharm.D.  and get switch back to warfarin.  8. Dilated aortic root - Echo 09/2017 showed aortic root at 11mm.  Continue stat and good Bp control.  Repeat echo 09/2018 to assess for progression  9.  COVID-19 Education:The signs and symptoms of COVID-19 were discussed with the patient and how to seek care for testing (follow up with PCP or arrange E-visit).  The importance of social distancing was discussed today.  Patient Risk:   After full review of this patient's clinical status, I feel that they are at least moderate risk at this time.  Time:   Today, I have spent 15 minutes directly with the patient on video discussing medical problems including OSA, HTN and Obesity.  We also reviewed the symptoms of COVID 19 and the ways to protect against contracting the virus with telehealth technology.  I spent an additional 5 minutes reviewing patient's chart including PAP compliance download.  Medication Adjustments/Labs and Tests Ordered: Current medicines are reviewed at length with the patient today.  Concerns regarding medicines are outlined above.  Tests Ordered: No orders of the defined types were placed in this encounter.  Medication Changes: No orders of the defined types were placed in this encounter.   Disposition:  Follow up in 1 year(s)  Signed, Fransico Him, MD  05/25/2018 1:39 PM    [facility]

## 2018-05-25 ENCOUNTER — Encounter: Payer: Self-pay | Admitting: Cardiology

## 2018-05-25 ENCOUNTER — Telehealth (INDEPENDENT_AMBULATORY_CARE_PROVIDER_SITE_OTHER): Payer: PPO | Admitting: Cardiology

## 2018-05-25 ENCOUNTER — Other Ambulatory Visit: Payer: Self-pay

## 2018-05-25 VITALS — BP 133/64 | HR 63 | Ht 74.0 in

## 2018-05-25 DIAGNOSIS — E669 Obesity, unspecified: Secondary | ICD-10-CM | POA: Diagnosis not present

## 2018-05-25 DIAGNOSIS — I2510 Atherosclerotic heart disease of native coronary artery without angina pectoris: Secondary | ICD-10-CM | POA: Diagnosis not present

## 2018-05-25 DIAGNOSIS — E7800 Pure hypercholesterolemia, unspecified: Secondary | ICD-10-CM | POA: Diagnosis not present

## 2018-05-25 DIAGNOSIS — I10 Essential (primary) hypertension: Secondary | ICD-10-CM

## 2018-05-25 DIAGNOSIS — Z7189 Other specified counseling: Secondary | ICD-10-CM | POA: Diagnosis not present

## 2018-05-25 DIAGNOSIS — I4819 Other persistent atrial fibrillation: Secondary | ICD-10-CM

## 2018-05-25 DIAGNOSIS — G4733 Obstructive sleep apnea (adult) (pediatric): Secondary | ICD-10-CM | POA: Diagnosis not present

## 2018-05-25 DIAGNOSIS — I350 Nonrheumatic aortic (valve) stenosis: Secondary | ICD-10-CM | POA: Diagnosis not present

## 2018-05-25 DIAGNOSIS — I77810 Thoracic aortic ectasia: Secondary | ICD-10-CM

## 2018-05-25 NOTE — Patient Instructions (Addendum)
Medication Instructions:  Your physician recommends that you continue on your current medications as directed. Please refer to the Current Medication list given to you today.  If you need a refill on your cardiac medications before your next appointment, please call your pharmacy.   Lab work: Fasting Labs: Lipid and Liver, same day as echo If you have labs (blood work) drawn today and your tests are completely normal, you will receive your results only by: Marland Kitchen MyChart Message (if you have MyChart) OR . A paper copy in the mail If you have any lab test that is abnormal or we need to change your treatment, we will call you to review the results.  Testing/Procedures: Your physician has requested that you have an echocardiogram around September. Echocardiography is a painless test that uses sound waves to create images of your heart. It provides your doctor with information about the size and shape of your heart and how well your heart's chambers and valves are working. This procedure takes approximately one hour. There are no restrictions for this procedure.  Follow-Up: Schedule appointment with Dr. Radford Pax in September.

## 2018-06-19 DIAGNOSIS — G4733 Obstructive sleep apnea (adult) (pediatric): Secondary | ICD-10-CM | POA: Diagnosis not present

## 2018-07-19 DIAGNOSIS — M159 Polyosteoarthritis, unspecified: Secondary | ICD-10-CM | POA: Diagnosis not present

## 2018-07-19 DIAGNOSIS — I2510 Atherosclerotic heart disease of native coronary artery without angina pectoris: Secondary | ICD-10-CM | POA: Diagnosis not present

## 2018-07-19 DIAGNOSIS — I4819 Other persistent atrial fibrillation: Secondary | ICD-10-CM | POA: Diagnosis not present

## 2018-07-19 DIAGNOSIS — Z8546 Personal history of malignant neoplasm of prostate: Secondary | ICD-10-CM | POA: Diagnosis not present

## 2018-07-19 DIAGNOSIS — N400 Enlarged prostate without lower urinary tract symptoms: Secondary | ICD-10-CM | POA: Diagnosis not present

## 2018-07-19 DIAGNOSIS — I10 Essential (primary) hypertension: Secondary | ICD-10-CM | POA: Diagnosis not present

## 2018-07-20 DIAGNOSIS — G4733 Obstructive sleep apnea (adult) (pediatric): Secondary | ICD-10-CM | POA: Diagnosis not present

## 2018-07-21 ENCOUNTER — Telehealth: Payer: Self-pay | Admitting: Cardiology

## 2018-07-21 NOTE — Telephone Encounter (Signed)
New Message   Pt c/o medication issue:  1. Name of Medication: apixaban (ELIQUIS) 5 MG TABS tablet    2. How are you currently taking this medication (dosage and times per day)?   3. Are you having a reaction (difficulty breathing--STAT)?  4. What is your medication issue? Patient is calling because he has developed a bruise on his leg. He is not sure if its from his blood thinner. But he does not recall bumping into anything. Please call to discuss.

## 2018-07-21 NOTE — Telephone Encounter (Signed)
Called patient back about his message. Patient complaining about a spot 6 inches above his left ankle on his shin. Patient stated he noticed the spot 3 days ago and it's gotten bigger and darker. Patient state it's about 2 inches by 3 inches and is deep dark red. Patient stated it's slightly swollen and was warm to touch yesterday, but just warm today. Patient stated that it has a white spot in the middle of it. Patient is seeing his PCP tomorrow, but was worried about being on eliquis. Will forward to Dr. Lovena Le for advisement.

## 2018-07-22 DIAGNOSIS — W57XXXA Bitten or stung by nonvenomous insect and other nonvenomous arthropods, initial encounter: Secondary | ICD-10-CM | POA: Diagnosis not present

## 2018-07-22 DIAGNOSIS — T148XXA Other injury of unspecified body region, initial encounter: Secondary | ICD-10-CM | POA: Diagnosis not present

## 2018-07-23 NOTE — Telephone Encounter (Signed)
Returned call to Pt.  Pt saw his PCP yesterday and was diagnosed with a tick bite.  He has been placed on appropriate tx for the tick bite and has no further needs at this time.

## 2018-07-27 ENCOUNTER — Other Ambulatory Visit: Payer: PPO

## 2018-08-17 ENCOUNTER — Other Ambulatory Visit: Payer: Self-pay | Admitting: Cardiology

## 2018-08-17 ENCOUNTER — Telehealth: Payer: Self-pay | Admitting: Pharmacist

## 2018-08-17 MED ORDER — DOFETILIDE 250 MCG PO CAPS: ORAL_CAPSULE | ORAL | 3 refills | Status: DC

## 2018-08-17 MED ORDER — APIXABAN 5 MG PO TABS: 5.0000 mg | ORAL_TABLET | Freq: Two times a day (BID) | ORAL | 1 refills | Status: DC

## 2018-08-17 NOTE — Telephone Encounter (Signed)
Spoke with pt regarding MyChart message sent today - sent in 1 month Eliquis rx to pharmacy per pt request to check on copay. He states he will not qualify for Eliquis pt assistance through [employer] (has not spent 3% of income on rx so far). He has 3 weeks of Eliquis left so far and will call us when he has 1 week left if he wishes to switch back to warfarin (he is debating continuing on Eliquis). He also requested 3 month supply of dofetilide be sent to [facility] for $100 savings, this has been done.

## 2018-08-18 ENCOUNTER — Telehealth: Payer: Self-pay

## 2018-08-18 NOTE — Telephone Encounter (Signed)
We received a PA request from [place] without a pts name on it for Dofetilide 250 mcg DOB: 1932/04/14. I called [facility] and was advised by Clarise Cruz (after giving her the information I had for this PA) that this is the pt requiring the PA as they only have 2 customers with that birth date and this pt is the only one of the two that is taking Dofetilide.  I attempted multiple times to do the PA with in information that [employer] provided me(Key: [id number]) but kept receiving the message: "No matching record"  I then started a new Dofetilide PA through covermymeds.and received the following message: Arianna Delsanto Key: [id number]   Outcome  Available without authorization.  DrugDofetilide 250MCG capsules  FormEnvisionRx Medicare 4-Part NCPDP Electronic PA Form  I called [facility] pharmacy to advise and s/w LaShawn who stated that they are still getting the message that a PA is required on the pts Dofetilide. He transferred my call to Cecille Rubin who is a pharmacist there who states that she will contact the pts insurance provider and will call me back if the PA is still required.

## 2018-08-19 DIAGNOSIS — G4733 Obstructive sleep apnea (adult) (pediatric): Secondary | ICD-10-CM | POA: Diagnosis not present

## 2018-08-19 NOTE — Telephone Encounter (Signed)
We received another Dofetilide PA request from [place]. I called [facility] and s/w Cecille Rubin who advised me that the pts Dofetilide RX went through successfully yesterday and was covered by [employer] so this PA has been taken care of. I requested and she re-ran the Rx and confirmed that the PA is no longer needed.

## 2018-08-21 DIAGNOSIS — G4733 Obstructive sleep apnea (adult) (pediatric): Secondary | ICD-10-CM | POA: Diagnosis not present

## 2018-08-26 ENCOUNTER — Other Ambulatory Visit: Payer: Self-pay | Admitting: Internal Medicine

## 2018-09-19 DIAGNOSIS — G4733 Obstructive sleep apnea (adult) (pediatric): Secondary | ICD-10-CM | POA: Diagnosis not present

## 2018-09-29 ENCOUNTER — Encounter: Payer: Self-pay | Admitting: Internal Medicine

## 2018-09-29 ENCOUNTER — Other Ambulatory Visit: Payer: Self-pay | Admitting: Pharmacist

## 2018-09-29 ENCOUNTER — Ambulatory Visit (HOSPITAL_COMMUNITY): Payer: PPO | Attending: Cardiovascular Disease

## 2018-09-29 ENCOUNTER — Other Ambulatory Visit: Payer: PPO

## 2018-09-29 ENCOUNTER — Ambulatory Visit: Payer: PPO | Admitting: Internal Medicine

## 2018-09-29 ENCOUNTER — Other Ambulatory Visit: Payer: Self-pay

## 2018-09-29 VITALS — BP 110/60 | HR 55 | Ht 74.0 in | Wt 236.0 lb

## 2018-09-29 DIAGNOSIS — M159 Polyosteoarthritis, unspecified: Secondary | ICD-10-CM | POA: Diagnosis not present

## 2018-09-29 DIAGNOSIS — I4819 Other persistent atrial fibrillation: Secondary | ICD-10-CM | POA: Insufficient documentation

## 2018-09-29 DIAGNOSIS — I350 Nonrheumatic aortic (valve) stenosis: Secondary | ICD-10-CM

## 2018-09-29 DIAGNOSIS — G4733 Obstructive sleep apnea (adult) (pediatric): Secondary | ICD-10-CM | POA: Diagnosis not present

## 2018-09-29 DIAGNOSIS — I77810 Thoracic aortic ectasia: Secondary | ICD-10-CM | POA: Insufficient documentation

## 2018-09-29 DIAGNOSIS — I351 Nonrheumatic aortic (valve) insufficiency: Secondary | ICD-10-CM | POA: Diagnosis not present

## 2018-09-29 DIAGNOSIS — E7800 Pure hypercholesterolemia, unspecified: Secondary | ICD-10-CM | POA: Diagnosis not present

## 2018-09-29 DIAGNOSIS — Z87891 Personal history of nicotine dependence: Secondary | ICD-10-CM | POA: Insufficient documentation

## 2018-09-29 DIAGNOSIS — I252 Old myocardial infarction: Secondary | ICD-10-CM | POA: Diagnosis not present

## 2018-09-29 DIAGNOSIS — I2510 Atherosclerotic heart disease of native coronary artery without angina pectoris: Secondary | ICD-10-CM | POA: Diagnosis not present

## 2018-09-29 DIAGNOSIS — I495 Sick sinus syndrome: Secondary | ICD-10-CM | POA: Diagnosis not present

## 2018-09-29 DIAGNOSIS — I119 Hypertensive heart disease without heart failure: Secondary | ICD-10-CM | POA: Insufficient documentation

## 2018-09-29 DIAGNOSIS — Z8546 Personal history of malignant neoplasm of prostate: Secondary | ICD-10-CM | POA: Diagnosis not present

## 2018-09-29 DIAGNOSIS — E785 Hyperlipidemia, unspecified: Secondary | ICD-10-CM | POA: Diagnosis not present

## 2018-09-29 DIAGNOSIS — I10 Essential (primary) hypertension: Secondary | ICD-10-CM | POA: Diagnosis not present

## 2018-09-29 DIAGNOSIS — N400 Enlarged prostate without lower urinary tract symptoms: Secondary | ICD-10-CM | POA: Diagnosis not present

## 2018-09-29 LAB — HEPATIC FUNCTION PANEL
ALT: 15 IU/L (ref 0–44)
AST: 24 IU/L (ref 0–40)
Albumin: 4.4 g/dL (ref 3.6–4.6)
Alkaline Phosphatase: 71 IU/L (ref 39–117)
Bilirubin Total: 0.5 mg/dL (ref 0.0–1.2)
Bilirubin, Direct: 0.17 mg/dL (ref 0.00–0.40)
Total Protein: 6.9 g/dL (ref 6.0–8.5)

## 2018-09-29 LAB — LIPID PANEL
Chol/HDL Ratio: 2.4 ratio (ref 0.0–5.0)
Cholesterol, Total: 110 mg/dL (ref 100–199)
HDL: 45 mg/dL (ref >39)
LDL Chol Calc (NIH): 51 mg/dL (ref 0–99)
Triglycerides: 68 mg/dL (ref 0–149)
VLDL Cholesterol Cal: 14 mg/dL (ref 5–40)

## 2018-09-29 MED ORDER — APIXABAN 5 MG PO TABS: 5.0000 mg | ORAL_TABLET | Freq: Two times a day (BID) | ORAL | 5 refills | Status: DC

## 2018-09-29 NOTE — Progress Notes (Signed)
HPI Randall Chen returns today for followup. He is a pleasant 85 yo man with a h/o persistent atrial fibrillation, who has maintained NSR very nicely on dofetilide. He denies palpitations. He remains active working in his garden. No chest pain or sob. No edema. He does have a h/o sinus node dysfunction but is essentially asymptomatic.   Allergies  Allergen Reactions  . Prednisone Other (See Comments)    anxiety  . Zocor [Simvastatin] Other (See Comments)    Muscle aches  . Yellow Jacket Venom [Bee Venom] Swelling    Swelling and raised bumps at site  . Hydrocodone Rash  . Oxycodone Hcl Rash     Current Outpatient Medications  Medication Sig Dispense Refill  . dofetilide (TIKOSYN) 250 MCG capsule TAKE 1 CAPSULE(250 MCG) BY MOUTH TWICE DAILY 180 capsule 3  . ezetimibe (ZETIA) 10 MG tablet TAKE 1 TABLET(10 MG) BY MOUTH DAILY 90 tablet 3  . fluticasone (FLONASE) 50 MCG/ACT nasal spray Place 1 spray into both nostrils daily as needed for allergies or rhinitis.    . magnesium oxide (MAG-OX) 400 MG tablet Take 1 tablet (400 mg total) by mouth daily. 90 tablet 1  . metoprolol tartrate (LOPRESSOR) 25 MG tablet TAKE 1 TABLET BY MOUTH TWICE DAILY 180 tablet 0  . nitroGLYCERIN (NITROSTAT) 0.4 MG SL tablet Place 1 tablet (0.4 mg total) under the tongue every 5 (five) minutes as needed for chest pain. 25 tablet 5  . omeprazole (PRILOSEC) 40 MG capsule Take 40 mg by mouth daily as needed (heartburn).    . rosuvastatin (CRESTOR) 5 MG tablet Take 1 tablet (5 mg total) by mouth 3 (three) times a week. 36 tablet 3  . tamsulosin (FLOMAX) 0.4 MG CAPS capsule Take 0.4 mg by mouth daily.    . vitamin B-12 (CYANOCOBALAMIN) 1000 MCG tablet Take 1,000 mcg by mouth every 7 (seven) days.     Marland Kitchen zolpidem (AMBIEN) 5 MG tablet Take 2.5-5 mg by mouth at bedtime as needed for sleep.     Marland Kitchen apixaban (ELIQUIS) 5 MG TABS tablet Take 1 tablet (5 mg total) by mouth 2 (two) times daily. 60 tablet 5   No current  facility-administered medications for this visit.    Facility-Administered Medications Ordered in Other Visits  Medication Dose Route Frequency Provider Last Rate Last Dose  . 0.9 %  sodium chloride infusion    Continuous PRN Lowella Dell, CRNA         Past Medical History:  Diagnosis Date  . Aortic stenosis    mild by echo 09/2017  . BCC (basal cell carcinoma of skin) 05/19/2001   Pigmented BCC Left Outer mouth  . BCC (basal cell carcinoma of skin) 06/22/2008   BCC Upper Left Back  . BCC (basal cell carcinoma) 11/04/2012   BCC Superficial Mid Forehead   . BCC (basal cell carcinoma) 12/02/2013   BCC Mid Forehead  . Coronary disease    2 Vessel,inferior wall MI june 2003, PTCA and Stent RCA June 2003, PTCA stent, Left Circumflex OM 02 Mar 2003, Dr Radford Pax  . Dilated aortic root ([place])    103mm by echo 09/2016  . DJD (degenerative joint disease) of knee    LEFT  . Dyslipidemia   . GERD (gastroesophageal reflux disease)   . History of rotator cuff syndrome    (Right) with biceps tendinitis  . Hypertension   . Nodular basal cell carcinoma (BCC) 10/13/2013   Nod BCC Mid Forehead  .  Nodular basal cell carcinoma (BCC) 11/14/2015   BCC Nod. Mid Forehead  . Nodular basal cell carcinoma (BCC) 07/16/2016   Left Forehead Med  . Nodular basal cell carcinoma (BCC) 07/16/2016   Left Forehead Lat  . Obesity   . OSA (obstructive sleep apnea)    presented with HA and drowsiness,mild sleep apnea with oxygen desats to 78%  . Persistent atrial fibrillation   . Prostate cancer ([place])    /BPH, radioactive seed 2007, Nesi   . SCC (squamous cell carcinoma) 06/22/2008   SCC In Situ Left Sideburn  . SCC (squamous cell carcinoma) 06/22/2008   SCC In Situ Left Preauricular  . SCC (squamous cell carcinoma) 06/22/2008   SCC In Situ Below Right Ear  . SCC (squamous cell carcinoma) 11/04/2012   SCC In Situ Lower Right Forearm  . SCC (squamous cell carcinoma) 02/11/2014   SCC Left Nasal Blub  .  SCC (squamous cell carcinoma) 11/14/2015   SCC In Situ Left Side Neck  . Visit for monitoring Tikosyn therapy 08/12/2017    ROS:   All systems reviewed and negative except as noted in the HPI.   Past Surgical History:  Procedure Laterality Date  . CARDIAC CATHETERIZATION    . CARDIOVERSION N/A 08/27/2013   Procedure: CARDIOVERSION;  Surgeon: Sueanne Margarita, MD;  Location: [facility] ENDOSCOPY;  Service: Cardiovascular;  Laterality: N/A;  . CARDIOVERSION N/A 03/15/2016   Procedure: CARDIOVERSION;  Surgeon: Dorothy Spark, MD;  Location: [place];  Service: Cardiovascular;  Laterality: N/A;  . CARDIOVERSION N/A 10/30/2016   Procedure: CARDIOVERSION;  Surgeon: Jerline Pain, MD;  Location: [facility] ENDOSCOPY;  Service: Cardiovascular;  Laterality: N/A;  . CORONARY ANGIOPLASTY    . JOINT REPLACEMENT     left knee replacement 2009  . TEE WITHOUT CARDIOVERSION N/A 10/30/2016   Procedure: TRANSESOPHAGEAL ECHOCARDIOGRAM (TEE);  Surgeon: Jerline Pain, MD;  Location[facility]COPY;  Service: Cardiovascular;  Laterality: N/A;     Family History  Problem Relation Age of Onset  . Diabetes Mother   . Peripheral vascular disease Father   . Heart attack Father   . Coronary artery disease Brother   . Liver cancer Brother   . Coronary artery disease Brother   . Heart attack Brother   . Cancer Brother   . Sudden death Brother   . Fainting Brother      Social History   Socioeconomic History  . Marital status: Married    Spouse name: Not on file  . Number of children: Not on file  . Years of education: Not on file  . Highest education level: Not on file  Occupational History  . Not on file  Social Needs  . Financial resource strain: Not on file  . Food insecurity    Worry: Not on file    Inability: Not on file  . Transportation needs    Medical: Not on file    Non-medical: Not on file  Tobacco Use  . Smoking status: Former Smoker    Quit date: 01/28/1958    Years since quitting: 60.7    . Smokeless tobacco: Never Used  Substance and Sexual Activity  . Alcohol use: Yes    Comment: "a few a week"  . Drug use: No  . Sexual activity: Not on file  Lifestyle  . Physical activity    Days per week: Not on file    Minutes per session: Not on file  . Stress: Not on file  Relationships  .  Social Herbalist on phone: Not on file    Gets together: Not on file    Attends religious service: Not on file    Active member of club or organization: Not on file    Attends meetings of clubs or organizations: Not on file    Relationship status: Not on file  . Intimate partner violence    Fear of current or ex partner: Not on file    Emotionally abused: Not on file    Physically abused: Not on file    Forced sexual activity: Not on file  Other Topics Concern  . Not on file  Social History Narrative  . Not on file     BP 110/60   Pulse (!) 55   Ht 6\' 2"  (1.88 m)   Wt 236 lb (107 kg)   SpO2 96%   BMI 30.30 kg/m   Physical Exam:  Well appearing NAD HEENT: Unremarkable Neck:  No JVD, no thyromegally Lymphatics:  No adenopathy Back:  No CVA tenderness Lungs:  Clear with no wheezes HEART:  Regular rate rhythm, no murmurs, no rubs, no clicks Abd:  soft, positive bowel sounds, no organomegally, no rebound, no guarding Ext:  2 plus pulses, no edema, no cyanosis, no clubbing Skin:  No rashes no nodules Neuro:  CN II through XII intact, motor grossly intact  EKG - NSR  Assess/Plan: 1. PAF - he is maintaining NSR very nicely on dofetilide. He will continue his current meds. QT is ok.  2. Sinus node dysfunction - he is asymptomatic. No indication for PPM insertion. He will undergo watchful waiting. I spent 15 minutes including 50% face to face time with the patient. Mikle Bosworth.D.

## 2018-09-29 NOTE — Patient Instructions (Addendum)

## 2018-09-30 ENCOUNTER — Other Ambulatory Visit (HOSPITAL_COMMUNITY): Payer: PPO

## 2018-09-30 ENCOUNTER — Telehealth: Payer: Self-pay

## 2018-09-30 ENCOUNTER — Other Ambulatory Visit: Payer: PPO

## 2018-09-30 NOTE — Telephone Encounter (Signed)
The patient has been notified of the result and verbalized understanding.  All questions (if any) were answered. Wilma Flavin, RN 09/30/2018 8:03 AM

## 2018-09-30 NOTE — Telephone Encounter (Signed)
-----   Message from Sueanne Margarita, MD sent at 09/29/2018 10:16 PM EDT ----- Echo showed normal LVF with mildly thickened heart muscle and increased stiffness of heart, mild RAE, moderate thickening of the AV with no AS, mildly dilated ascending aorta.  Stable echo from prior.  Repeat limited echo in 1 year to reassess aortic dilatation.

## 2018-09-30 NOTE — Telephone Encounter (Signed)
Notes recorded by Frederik Schmidt, RN on 09/30/2018 at 8:11 AM EDT  The patient has been notified of the result and verbalized understanding. All questions (if any) were answered.  Frederik Schmidt, RN 09/30/2018 8:11 AM

## 2018-10-04 NOTE — Progress Notes (Signed)
Virtual Visit via Video Note   This visit type was conducted due to national recommendations for restrictions regarding the COVID-19 Pandemic (e.g. social distancing) in an effort to limit this patient's exposure and mitigate transmission in our community.  Due to his co-morbid illnesses, this patient is at least at moderate risk for complications without adequate follow up.  This format is felt to be most appropriate for this patient at this time.  All issues noted in this document were discussed and addressed.  A limited physical exam was performed with this format.  Please refer to the patient's chart for his consent to telehealth for [facility].   Evaluation Performed:  Follow-up visit  This visit type was conducted due to national recommendations for restrictions regarding the COVID-19 Pandemic (e.g. social distancing).  This format is felt to be most appropriate for this patient at this time.  All issues noted in this document were discussed and addressed.  No physical exam was performed (except for noted visual exam findings with Video Visits).  Please refer to the patient's chart (MyChart message for video visits and phone note for telephone visits) for the patient's consent to telehealth for [facility].  Date:  10/06/2018   ID:  Randall Chen, DOB 01-12-1933, MRN [id number]  Patient Location:  Home  Provider location:   [place]  PCP:  Lavone Orn, MD  Sleep Medicine:  Fransico Him, MD Electrophysiologist:  None   Chief Complaint:  OSA, HTN, CAD  History of Present Illness:    Randall Chen is a 85 y.o. male who presents via audio/video conferencing for a telehealth visit today.    Randall Hollan Bennettis an 85 y.o.malewith a hx of OSA on CPAP, HTN, ASCAD s/p inferior MI 2003 with PCI of RCA 6/03 and then PCI of left circ/OM 2/05, dyslipidemia, bicuspid AVwith mild AS, mildly dilated aorta and PAF followed by Dr. Lovena Le.  He had a reoccurrence of PAF in Feb 2018  and underwent successful DCCV to NSR. He was seen in the afib clinic 10/14/2016 with complaints of palpitations for 1 week after a gambling weekend in [place] where he drank more ETOH than usual. Due to subtherapeutic INR, he underwent TEE/DCCV to NSR on 10/30/2016.He was seen back in afib clinic in Feb 2019 with recurrent afib. ADD therapy was discussed with only option being Tikosyn. He has bradycardia so Sotolol was not an option.  He was subsequently loaded on dofetilide.   He is here today for followup and is doing well.  He denies any chest pain or pressure, SOB, DOE, PND, orthopnea, LE edema, dizziness, palpitations or syncope. he is compliant with his meds and is tolerating meds with no SE.  He is doing well with his CPAP device and thinks that he has gotten used to it.  He tolerates the mask and feels the pressure is adequate.  Since going on CPAP he feels rested in the am and has no significant daytime sleepiness.  He denies any significant mouth or nasal dryness or nasal congestion.  He does not think that he snores.     The patient does not have symptoms concerning for COVID-19 infection (fever, chills, cough, or new shortness of breath).    Prior CV studies:   The following studies were reviewed today:  PAP compliance download  Past Medical History:  Diagnosis Date  . Aortic stenosis    mild by echo 09/2017  . BCC (basal cell carcinoma of skin) 05/19/2001   Pigmented  BCC Left Outer mouth  . BCC (basal cell carcinoma of skin) 06/22/2008   BCC Upper Left Back  . BCC (basal cell carcinoma) 11/04/2012   BCC Superficial Mid Forehead   . BCC (basal cell carcinoma) 12/02/2013   BCC Mid Forehead  . Coronary disease    2 Vessel,inferior wall MI june 2003, PTCA and Stent RCA June 2003, PTCA stent, Left Circumflex OM 02 Mar 2003, Dr Radford Pax  . Dilated aortic root ([place])    85mm by echo 09/2016  . DJD (degenerative joint disease) of knee    LEFT  . Dyslipidemia   . GERD  (gastroesophageal reflux disease)   . History of rotator cuff syndrome    (Right) with biceps tendinitis  . Hypertension   . Nodular basal cell carcinoma (BCC) 10/13/2013   Nod BCC Mid Forehead  . Nodular basal cell carcinoma (BCC) 11/14/2015   BCC Nod. Mid Forehead  . Nodular basal cell carcinoma (BCC) 07/16/2016   Left Forehead Med  . Nodular basal cell carcinoma (BCC) 07/16/2016   Left Forehead Lat  . Obesity   . OSA (obstructive sleep apnea)    presented with HA and drowsiness,mild sleep apnea with oxygen desats to 78%  . Persistent atrial fibrillation   . Prostate cancer ([place])    /BPH, radioactive seed 2007, Nesi   . SCC (squamous cell carcinoma) 06/22/2008   SCC In Situ Left Sideburn  . SCC (squamous cell carcinoma) 06/22/2008   SCC In Situ Left Preauricular  . SCC (squamous cell carcinoma) 06/22/2008   SCC In Situ Below Right Ear  . SCC (squamous cell carcinoma) 11/04/2012   SCC In Situ Lower Right Forearm  . SCC (squamous cell carcinoma) 02/11/2014   SCC Left Nasal Blub  . SCC (squamous cell carcinoma) 11/14/2015   SCC In Situ Left Side Neck  . Visit for monitoring Tikosyn therapy 08/12/2017   Past Surgical History:  Procedure Laterality Date  . CARDIAC CATHETERIZATION    . CARDIOVERSION N/A 08/27/2013   Procedure: CARDIOVERSION;  Surgeon: Sueanne Margarita, MD;  Location: [facility] ENDOSCOPY;  Service: Cardiovascular;  Laterality: N/A;  . CARDIOVERSION N/A 03/15/2016   Procedure: CARDIOVERSION;  Surgeon: Dorothy Spark, MD;  Location: [facility];  Service: Cardiovascular;  Laterality: N/A;  . CARDIOVERSION N/A 10/30/2016   Procedure: CARDIOVERSION;  Surgeon: Jerline Pain, MD;  Location: [facility] ENDOSCOPY;  Service: Cardiovascular;  Laterality: N/A;  . CORONARY ANGIOPLASTY    . JOINT REPLACEMENT     left knee replacement 2009  . TEE WITHOUT CARDIOVERSION N/A 10/30/2016   Procedure: TRANSESOPHAGEAL ECHOCARDIOGRAM (TEE);  Surgeon: Jerline Pain, MD;  Location: [facility] ENDOSCOPY;   Service: Cardiovascular;  Laterality: N/A;     Current Meds  Medication Sig  . apixaban (ELIQUIS) 5 MG TABS tablet Take 1 tablet (5 mg total) by mouth 2 (two) times daily.  Marland Kitchen dofetilide (TIKOSYN) 250 MCG capsule TAKE 1 CAPSULE(250 MCG) BY MOUTH TWICE DAILY  . ezetimibe (ZETIA) 10 MG tablet TAKE 1 TABLET(10 MG) BY MOUTH DAILY  . fluticasone (FLONASE) 50 MCG/ACT nasal spray Place 1 spray into both nostrils daily as needed for allergies or rhinitis.  . magnesium oxide (MAG-OX) 400 MG tablet Take 1 tablet (400 mg total) by mouth daily.  . metoprolol tartrate (LOPRESSOR) 25 MG tablet TAKE 1 TABLET BY MOUTH TWICE DAILY  . nitroGLYCERIN (NITROSTAT) 0.4 MG SL tablet Place 1 tablet (0.4 mg total) under the tongue every 5 (five) minutes as needed for chest pain.  Marland Kitchen omeprazole (  PRILOSEC) 40 MG capsule Take 40 mg by mouth daily as needed (heartburn).  . rosuvastatin (CRESTOR) 5 MG tablet Take 1 tablet (5 mg total) by mouth 3 (three) times a week.  . tamsulosin (FLOMAX) 0.4 MG CAPS capsule Take 0.4 mg by mouth daily.  . vitamin B-12 (CYANOCOBALAMIN) 1000 MCG tablet Take 1,000 mcg by mouth every 7 (seven) days.   Marland Kitchen zolpidem (AMBIEN) 5 MG tablet Take 2.5-5 mg by mouth at bedtime as needed for sleep.      Allergies:   Prednisone, Zocor [simvastatin], Yellow jacket venom [bee venom], Hydrocodone, and Oxycodone hcl   Social History   Tobacco Use  . Smoking status: Former Smoker    Quit date: 01/28/1958    Years since quitting: 60.7  . Smokeless tobacco: Never Used  Substance Use Topics  . Alcohol use: Yes    Comment: "a few a week"  . Drug use: No     Family Hx: The patient's family history includes Cancer in his brother; Coronary artery disease in his brother and brother; Diabetes in his mother; Fainting in his brother; Heart attack in his brother and father; Liver cancer in his brother; Peripheral vascular disease in his father; Sudden death in his brother.  ROS:   Please see the history of  present illness.     All other systems reviewed and are negative.   Labs/Other Tests and Data Reviewed:    Recent Labs: 09/29/2018: ALT 15   Recent Lipid Panel Lab Results  Component Value Date/Time   CHOL 110 09/29/2018 08:12 AM   CHOL 101 06/17/2013 08:21 AM   TRIG 68 09/29/2018 08:12 AM   TRIG 75 06/17/2013 08:21 AM   HDL 45 09/29/2018 08:12 AM   HDL 42 06/17/2013 08:21 AM   CHOLHDL 2.4 09/29/2018 08:12 AM   CHOLHDL 2.7 09/18/2015 09:02 AM   LDLCALC 35 11/04/2016 09:49 AM   LDLCALC 44 06/17/2013 08:21 AM    Wt Readings from Last 3 Encounters:  10/06/18 237 lb (107.5 kg)  09/29/18 236 lb (107 kg)  02/26/18 237 lb (107.5 kg)     Objective:    Vital Signs:  BP (!) 153/78   Pulse (!) 57   Ht 6\' 2"  (1.88 m)   Wt 237 lb (107.5 kg)   BMI 30.43 kg/m     ASSESSMENT & PLAN:    1. OSA - The PAP download was reviewed today and showed an AHI of 2.5/hr on 7 cm H2O with 97% compliance in using more than 4 hours nightly.  The patient has been using and benefiting from PAP use and will continue to benefit from therapy.   2.  HTN -BP is borderline controlled today -continue Lopressor 25mg  BID  3.  Obesity - I have encouraged him to get into a routine exercise program and cut back on carbs and portions.   4.  ASCAD  - s/p inferior MI 2003 with PCI of RCA 6/03 and then PCI of left circ/OM 2/05.   -he denies any anginal sx -continue BB and statin.  No ASA due to DOAC  5.  Mild AS  - stable by echo 09/2018  6.  Hyperlipidemia  -LDL goal is less than 70.   -Last LDL was 51 on 09/2018.   -continue crestor 5mg  3 times weekly and Zetia 10mg  daily.  7.  Paroxysmal atrial fibrillation  -he has not had any palpitations   -continue on Tikosyn 250 mcg twice daily and Lopressor 25 mg twice daily.   -  continue Apixaban 5mg  BID.   -check CBC  8. Dilated aortic root  - Echo 09/2018  Aortic root 69mm -Continue statin and BP control -repeat echo 09/2019  COVID-19 Education:  The signs and symptoms of COVID-19 were discussed with the patient and how to seek care for testing (follow up with PCP or arrange E-visit).  The importance of social distancing was discussed today.  Patient Risk:   After full review of this patient's clinical status, I feel that they are at least moderate risk at this time.  Time:   Today, I have spent 20 minutes directly with the patient on telemedicine discussing medical problems including CAD, PAF, dilated aortic root, OSA.  We also reviewed the symptoms of COVID 19 and the ways to protect against contracting the virus with telehealth technology.  I spent an additional 5 minutes reviewing patient's chart including 2D echo and labs.  Medication Adjustments/Labs and Tests Ordered: Current medicines are reviewed at length with the patient today.  Concerns regarding medicines are outlined above.  Tests Ordered: No orders of the defined types were placed in this encounter.  Medication Changes: No orders of the defined types were placed in this encounter.   Disposition:  Follow up in 1 year(s)  Signed, Fransico Him, MD  10/06/2018 8:08 AM    [facility]

## 2018-10-06 ENCOUNTER — Telehealth (INDEPENDENT_AMBULATORY_CARE_PROVIDER_SITE_OTHER): Payer: PPO | Admitting: Cardiology

## 2018-10-06 ENCOUNTER — Other Ambulatory Visit: Payer: Self-pay

## 2018-10-06 ENCOUNTER — Other Ambulatory Visit (HOSPITAL_COMMUNITY): Payer: PPO

## 2018-10-06 ENCOUNTER — Encounter: Payer: Self-pay | Admitting: Cardiology

## 2018-10-06 ENCOUNTER — Other Ambulatory Visit: Payer: PPO

## 2018-10-06 VITALS — BP 153/78 | HR 57 | Ht 74.0 in | Wt 237.0 lb

## 2018-10-06 DIAGNOSIS — I2510 Atherosclerotic heart disease of native coronary artery without angina pectoris: Secondary | ICD-10-CM

## 2018-10-06 DIAGNOSIS — I4819 Other persistent atrial fibrillation: Secondary | ICD-10-CM | POA: Diagnosis not present

## 2018-10-06 DIAGNOSIS — I350 Nonrheumatic aortic (valve) stenosis: Secondary | ICD-10-CM | POA: Diagnosis not present

## 2018-10-06 DIAGNOSIS — E7800 Pure hypercholesterolemia, unspecified: Secondary | ICD-10-CM

## 2018-10-06 DIAGNOSIS — I10 Essential (primary) hypertension: Secondary | ICD-10-CM

## 2018-10-06 DIAGNOSIS — I77810 Thoracic aortic ectasia: Secondary | ICD-10-CM

## 2018-10-06 DIAGNOSIS — G4733 Obstructive sleep apnea (adult) (pediatric): Secondary | ICD-10-CM | POA: Diagnosis not present

## 2018-10-06 DIAGNOSIS — E669 Obesity, unspecified: Secondary | ICD-10-CM

## 2018-10-06 NOTE — Patient Instructions (Addendum)
Medication Instructions:  Your physician recommends that you continue on your current medications as directed. Please refer to the Current Medication list given to you today.  If you need a refill on your cardiac medications before your next appointment, please call your pharmacy.   Lab work: CBC  If you have labs (blood work) drawn today and your tests are completely normal, you will receive your results only by: Marland Kitchen MyChart Message (if you have MyChart) OR . A paper copy in the mail If you have any lab test that is abnormal or we need to change your treatment, we will call you to review the results.  Testing/Procedures: Your physician has requested that you have an echocardiogram just prior to seeing Dr. Radford Pax back in a year. Echocardiography is a painless test that uses sound waves to create images of your heart. It provides your doctor with information about the size and shape of your heart and how well your heart's chambers and valves are working. This procedure takes approximately one hour. There are no restrictions for this procedure.   Follow-Up: At [facility], you and your health needs are our priority.  As part of our continuing mission to provide you with exceptional heart care, we have created designated Provider Care Teams.  These Care Teams include your primary Cardiologist (physician) and Advanced Practice Providers (APPs -  Physician Assistants and Nurse Practitioners) who all work together to provide you with the care you need, when you need it. You will need a follow up appointment in 12 months.  Please call our office 2 months in advance to schedule this appointment.  You may see Dr. Radford Pax or one of the following Advanced Practice Providers on your designated Care Team:   [place], PA-C Melina Copa, PA-C . Ermalinda Barrios, PA-C  Any Other Special Instructions Will Be Listed Below (If Applicable).

## 2018-10-08 ENCOUNTER — Other Ambulatory Visit: Payer: Self-pay

## 2018-10-08 ENCOUNTER — Other Ambulatory Visit: Payer: PPO | Admitting: *Deleted

## 2018-10-08 DIAGNOSIS — I4819 Other persistent atrial fibrillation: Secondary | ICD-10-CM

## 2018-10-08 LAB — CBC
Hematocrit: 41 % (ref 37.5–51.0)
Hemoglobin: 13.5 g/dL (ref 13.0–17.7)
MCH: 30.7 pg (ref 26.6–33.0)
MCHC: 32.9 g/dL (ref 31.5–35.7)
MCV: 93 fL (ref 79–97)
Platelets: 164 10*3/uL (ref 150–450)
RBC: 4.4 x10E6/uL (ref 4.14–5.80)
RDW: 13.3 % (ref 11.6–15.4)
WBC: 6.5 10*3/uL (ref 3.4–10.8)

## 2018-10-09 ENCOUNTER — Telehealth: Payer: Self-pay

## 2018-10-09 NOTE — Telephone Encounter (Signed)
-----   Message from Sueanne Margarita, MD sent at 10/09/2018 10:10 AM EDT ----- Please let patient know that labs were normal.  Continue current medical therapy.

## 2018-10-09 NOTE — Telephone Encounter (Signed)
Notes recorded by Frederik Schmidt, RN on 10/09/2018 at 11:04 AM EDT  The patient has been notified of the result and verbalized understanding. All questions (if any) were answered.  Frederik Schmidt, RN 10/09/2018 11:04 AM

## 2018-10-20 ENCOUNTER — Other Ambulatory Visit (HOSPITAL_COMMUNITY): Payer: PPO

## 2018-10-21 DIAGNOSIS — G4733 Obstructive sleep apnea (adult) (pediatric): Secondary | ICD-10-CM | POA: Diagnosis not present

## 2018-11-20 ENCOUNTER — Other Ambulatory Visit: Payer: Self-pay

## 2018-11-20 MED ORDER — AMLODIPINE BESYLATE 2.5 MG PO TABS: 2.5000 mg | ORAL_TABLET | Freq: Every day | ORAL | 3 refills | Status: DC

## 2018-11-24 ENCOUNTER — Other Ambulatory Visit: Payer: Self-pay | Admitting: *Deleted

## 2018-11-24 MED ORDER — APIXABAN 5 MG PO TABS: 5.0000 mg | ORAL_TABLET | Freq: Two times a day (BID) | ORAL | 5 refills | Status: DC

## 2018-11-24 NOTE — Telephone Encounter (Signed)
Prescription refill request for Eliquis received.  Last office visit: [place] (10-06-2018) Scr: 1.20 (03-05-2018) Age: 85 y.o.  Weight: 107 kg   Prescription refill sent.   Called and spoke to pt. He stated the [facility] pharmacy is more convenient to pick up his Eliquis.

## 2018-12-01 DIAGNOSIS — I2510 Atherosclerotic heart disease of native coronary artery without angina pectoris: Secondary | ICD-10-CM | POA: Diagnosis not present

## 2018-12-01 DIAGNOSIS — E7800 Pure hypercholesterolemia, unspecified: Secondary | ICD-10-CM | POA: Diagnosis not present

## 2018-12-01 DIAGNOSIS — N400 Enlarged prostate without lower urinary tract symptoms: Secondary | ICD-10-CM | POA: Diagnosis not present

## 2018-12-01 DIAGNOSIS — I4819 Other persistent atrial fibrillation: Secondary | ICD-10-CM | POA: Diagnosis not present

## 2018-12-01 DIAGNOSIS — I10 Essential (primary) hypertension: Secondary | ICD-10-CM | POA: Diagnosis not present

## 2018-12-01 DIAGNOSIS — Z8546 Personal history of malignant neoplasm of prostate: Secondary | ICD-10-CM | POA: Diagnosis not present

## 2018-12-01 DIAGNOSIS — M159 Polyosteoarthritis, unspecified: Secondary | ICD-10-CM | POA: Diagnosis not present

## 2018-12-02 ENCOUNTER — Other Ambulatory Visit: Payer: Self-pay | Admitting: Cardiology

## 2018-12-02 MED ORDER — ROSUVASTATIN CALCIUM 5 MG PO TABS: 5.0000 mg | ORAL_TABLET | ORAL | 3 refills | Status: DC

## 2018-12-08 ENCOUNTER — Other Ambulatory Visit: Payer: Self-pay

## 2018-12-08 MED ORDER — DOFETILIDE 250 MCG PO CAPS: ORAL_CAPSULE | ORAL | 2 refills | Status: DC

## 2018-12-08 NOTE — Telephone Encounter (Signed)
Pt would rather have this medication sent over to his mail order.

## 2019-01-01 DIAGNOSIS — L570 Actinic keratosis: Secondary | ICD-10-CM | POA: Diagnosis not present

## 2019-01-01 DIAGNOSIS — G4733 Obstructive sleep apnea (adult) (pediatric): Secondary | ICD-10-CM | POA: Diagnosis not present

## 2019-01-03 ENCOUNTER — Other Ambulatory Visit: Payer: Self-pay | Admitting: Internal Medicine

## 2019-02-05 DIAGNOSIS — E7800 Pure hypercholesterolemia, unspecified: Secondary | ICD-10-CM | POA: Diagnosis not present

## 2019-02-05 DIAGNOSIS — I4819 Other persistent atrial fibrillation: Secondary | ICD-10-CM | POA: Diagnosis not present

## 2019-02-05 DIAGNOSIS — M159 Polyosteoarthritis, unspecified: Secondary | ICD-10-CM | POA: Diagnosis not present

## 2019-02-05 DIAGNOSIS — N400 Enlarged prostate without lower urinary tract symptoms: Secondary | ICD-10-CM | POA: Diagnosis not present

## 2019-02-05 DIAGNOSIS — I2510 Atherosclerotic heart disease of native coronary artery without angina pectoris: Secondary | ICD-10-CM | POA: Diagnosis not present

## 2019-02-05 DIAGNOSIS — Z8546 Personal history of malignant neoplasm of prostate: Secondary | ICD-10-CM | POA: Diagnosis not present

## 2019-02-05 DIAGNOSIS — I10 Essential (primary) hypertension: Secondary | ICD-10-CM | POA: Diagnosis not present

## 2019-02-10 ENCOUNTER — Ambulatory Visit: Payer: PPO | Attending: Internal Medicine

## 2019-02-10 DIAGNOSIS — Z20822 Contact with and (suspected) exposure to covid-19: Secondary | ICD-10-CM | POA: Diagnosis not present

## 2019-02-12 LAB — NOVEL CORONAVIRUS, NAA: SARS-CoV-2, NAA: NOT DETECTED

## 2019-02-17 ENCOUNTER — Other Ambulatory Visit: Payer: Self-pay | Admitting: *Deleted

## 2019-02-17 MED ORDER — APIXABAN 5 MG PO TABS: 5.0000 mg | ORAL_TABLET | Freq: Two times a day (BID) | ORAL | 1 refills | Status: DC

## 2019-02-17 NOTE — Addendum Note (Signed)
Addended by: Johny Shock B on: 02/17/2019 11:29 AM   Modules accepted: Orders

## 2019-02-17 NOTE — Telephone Encounter (Signed)
Prescription refill request for Eliquis received.  Last office visit: 10/06/2018, [place] Scr: 1.2, 03/05/2018 Age: 86 y.o. Weight: 107 kg  Prescription refill sent.

## 2019-02-23 ENCOUNTER — Ambulatory Visit: Payer: PPO

## 2019-03-05 ENCOUNTER — Ambulatory Visit: Payer: PPO

## 2019-03-07 ENCOUNTER — Ambulatory Visit: Payer: PPO | Attending: Internal Medicine

## 2019-03-07 DIAGNOSIS — Z23 Encounter for immunization: Secondary | ICD-10-CM

## 2019-03-07 NOTE — Progress Notes (Signed)
   Covid-19 Vaccination Clinic  Name:  Randall Chen    MRN: [id number] DOB: 02-22-1932  03/07/2019  [name] was observed post Covid-19 immunization for 15 minutes without incidence. He was provided with Vaccine Information Sheet and instruction to access the V-Safe system.   [name] was instructed to call 911 with any severe reactions post vaccine: Marland Kitchen Difficulty breathing  . Swelling of your face and throat  . A fast heartbeat  . A bad rash all over your body  . Dizziness and weakness    Immunizations Administered    Name Date Dose VIS Date Route   Pfizer COVID-19 Vaccine 03/07/2019  9:45 AM 0.3 mL 01/08/2019 Intramuscular   Manufacturer: [place]   Lot: [id number]   [place]: [id number]

## 2019-03-09 DIAGNOSIS — Z79899 Other long term (current) drug therapy: Secondary | ICD-10-CM | POA: Diagnosis not present

## 2019-03-09 DIAGNOSIS — I2510 Atherosclerotic heart disease of native coronary artery without angina pectoris: Secondary | ICD-10-CM | POA: Diagnosis not present

## 2019-03-09 DIAGNOSIS — N400 Enlarged prostate without lower urinary tract symptoms: Secondary | ICD-10-CM | POA: Diagnosis not present

## 2019-03-09 DIAGNOSIS — G4733 Obstructive sleep apnea (adult) (pediatric): Secondary | ICD-10-CM | POA: Diagnosis not present

## 2019-03-09 DIAGNOSIS — Z0000 Encounter for general adult medical examination without abnormal findings: Secondary | ICD-10-CM | POA: Diagnosis not present

## 2019-03-09 DIAGNOSIS — K219 Gastro-esophageal reflux disease without esophagitis: Secondary | ICD-10-CM | POA: Diagnosis not present

## 2019-03-09 DIAGNOSIS — Z8546 Personal history of malignant neoplasm of prostate: Secondary | ICD-10-CM | POA: Diagnosis not present

## 2019-03-09 DIAGNOSIS — I10 Essential (primary) hypertension: Secondary | ICD-10-CM | POA: Diagnosis not present

## 2019-03-09 DIAGNOSIS — E7800 Pure hypercholesterolemia, unspecified: Secondary | ICD-10-CM | POA: Diagnosis not present

## 2019-03-09 DIAGNOSIS — E669 Obesity, unspecified: Secondary | ICD-10-CM | POA: Diagnosis not present

## 2019-03-09 DIAGNOSIS — Z6833 Body mass index (BMI) 33.0-33.9, adult: Secondary | ICD-10-CM | POA: Diagnosis not present

## 2019-03-09 DIAGNOSIS — Z1389 Encounter for screening for other disorder: Secondary | ICD-10-CM | POA: Diagnosis not present

## 2019-03-18 DIAGNOSIS — I2510 Atherosclerotic heart disease of native coronary artery without angina pectoris: Secondary | ICD-10-CM | POA: Diagnosis not present

## 2019-03-18 DIAGNOSIS — M159 Polyosteoarthritis, unspecified: Secondary | ICD-10-CM | POA: Diagnosis not present

## 2019-03-18 DIAGNOSIS — I10 Essential (primary) hypertension: Secondary | ICD-10-CM | POA: Diagnosis not present

## 2019-03-18 DIAGNOSIS — E7800 Pure hypercholesterolemia, unspecified: Secondary | ICD-10-CM | POA: Diagnosis not present

## 2019-03-18 DIAGNOSIS — I4819 Other persistent atrial fibrillation: Secondary | ICD-10-CM | POA: Diagnosis not present

## 2019-03-18 DIAGNOSIS — N400 Enlarged prostate without lower urinary tract symptoms: Secondary | ICD-10-CM | POA: Diagnosis not present

## 2019-03-18 DIAGNOSIS — Z8546 Personal history of malignant neoplasm of prostate: Secondary | ICD-10-CM | POA: Diagnosis not present

## 2019-04-02 ENCOUNTER — Other Ambulatory Visit: Payer: Self-pay

## 2019-04-02 ENCOUNTER — Ambulatory Visit: Payer: PPO | Attending: Internal Medicine

## 2019-04-02 DIAGNOSIS — Z23 Encounter for immunization: Secondary | ICD-10-CM

## 2019-04-02 NOTE — Progress Notes (Signed)
   Covid-19 Vaccination Clinic  Name:  Randall Chen    MRN: [id number] DOB: 11-15-1932  04/02/2019  [name] was observed post Covid-19 immunization for 15 minutes without incident. He was provided with Vaccine Information Sheet and instruction to access the V-Safe system.   [name] was instructed to call 911 with any severe reactions post vaccine: Marland Kitchen Difficulty breathing  . Swelling of face and throat  . A fast heartbeat  . A bad rash all over body  . Dizziness and weakness   Immunizations Administered    Name Date Dose VIS Date Route   Pfizer COVID-19 Vaccine 04/02/2019  1:45 PM 0.3 mL 01/08/2019 Intramuscular   Manufacturer: [place]   Lot: [id number]   [place]: [id number]

## 2019-04-06 ENCOUNTER — Ambulatory Visit: Payer: PPO | Admitting: Internal Medicine

## 2019-04-06 ENCOUNTER — Encounter: Payer: Self-pay | Admitting: Internal Medicine

## 2019-04-06 ENCOUNTER — Other Ambulatory Visit: Payer: Self-pay

## 2019-04-06 VITALS — BP 132/70 | HR 50 | Ht 74.0 in | Wt 251.8 lb

## 2019-04-06 DIAGNOSIS — I4819 Other persistent atrial fibrillation: Secondary | ICD-10-CM | POA: Diagnosis not present

## 2019-04-06 DIAGNOSIS — I495 Sick sinus syndrome: Secondary | ICD-10-CM | POA: Diagnosis not present

## 2019-04-06 NOTE — Patient Instructions (Signed)

## 2019-04-06 NOTE — Progress Notes (Signed)
HPI Mr. Drzewiecki returns today for followup of his PAF. He is a pleasant 86 yo man with persistent atrial fib who was started on dofetilide. He admits to some dietary indiscretion since his last visit and has gained 15 lbs. In the past 6 months. Allergies  Allergen Reactions  . Prednisone Other (See Comments)    anxiety  . Zocor [Simvastatin] Other (See Comments)    Muscle aches  . Yellow Jacket Venom [Bee Venom] Swelling    Swelling and raised bumps at site  . Hydrocodone Rash  . Oxycodone Hcl Rash     Current Outpatient Medications  Medication Sig Dispense Refill  . apixaban (ELIQUIS) 5 MG TABS tablet Take 1 tablet (5 mg total) by mouth 2 (two) times daily. 180 tablet 1  . dofetilide (TIKOSYN) 250 MCG capsule TAKE 1 CAPSULE(250 MCG) BY MOUTH TWICE DAILY 180 capsule 2  . ezetimibe (ZETIA) 10 MG tablet TAKE 1 TABLET(10 MG) BY MOUTH DAILY 90 tablet 3  . fluticasone (FLONASE) 50 MCG/ACT nasal spray Place 1 spray into both nostrils daily as needed for allergies or rhinitis.    . magnesium oxide (MAG-OX) 400 MG tablet Take 1 tablet (400 mg total) by mouth daily. 90 tablet 1  . metoprolol tartrate (LOPRESSOR) 25 MG tablet TAKE 1 TABLET BY MOUTH TWICE DAILY 180 tablet 3  . nitroGLYCERIN (NITROSTAT) 0.4 MG SL tablet Place 1 tablet (0.4 mg total) under the tongue every 5 (five) minutes as needed for chest pain. 25 tablet 5  . omeprazole (PRILOSEC) 40 MG capsule Take 40 mg by mouth daily as needed (heartburn).    . rosuvastatin (CRESTOR) 5 MG tablet Take 1 tablet (5 mg total) by mouth 3 (three) times a week. 36 tablet 3  . tamsulosin (FLOMAX) 0.4 MG CAPS capsule Take 0.4 mg by mouth daily.    Marland Kitchen zolpidem (AMBIEN) 5 MG tablet Take 2.5-5 mg by mouth at bedtime as needed for sleep.      No current facility-administered medications for this visit.   Facility-Administered Medications Ordered in Other Visits  Medication Dose Route Frequency Provider Last Rate Last Admin  . 0.9 %  sodium  chloride infusion    Continuous PRN Lowella Dell, CRNA   New Bag at 07/19/13 [id number]     Past Medical History:  Diagnosis Date  . Aortic stenosis    mild by echo 09/2017  . BCC (basal cell carcinoma of skin) 05/19/2001   Pigmented BCC Left Outer mouth  . BCC (basal cell carcinoma of skin) 06/22/2008   BCC Upper Left Back  . BCC (basal cell carcinoma) 11/04/2012   BCC Superficial Mid Forehead   . BCC (basal cell carcinoma) 12/02/2013   BCC Mid Forehead  . Coronary disease    2 Vessel,inferior wall MI june 2003, PTCA and Stent RCA June 2003, PTCA stent, Left Circumflex OM 02 Mar 2003, Dr Radford Pax  . Dilated aortic root ([place])    75mm by echo 09/2016  . DJD (degenerative joint disease) of knee    LEFT  . Dyslipidemia   . GERD (gastroesophageal reflux disease)   . History of rotator cuff syndrome    (Right) with biceps tendinitis  . Hypertension   . Nodular basal cell carcinoma (BCC) 10/13/2013   Nod BCC Mid Forehead  . Nodular basal cell carcinoma (BCC) 11/14/2015   BCC Nod. Mid Forehead  . Nodular basal cell carcinoma (BCC) 07/16/2016   Left Forehead Med  . Nodular basal cell carcinoma (  BCC) 07/16/2016   Left Forehead Lat  . Obesity   . OSA (obstructive sleep apnea)    presented with HA and drowsiness,mild sleep apnea with oxygen desats to 78%  . Persistent atrial fibrillation ([place])   . Prostate cancer ([place])    /BPH, radioactive seed 2007, Nesi   . SCC (squamous cell carcinoma) 06/22/2008   SCC In Situ Left Sideburn  . SCC (squamous cell carcinoma) 06/22/2008   SCC In Situ Left Preauricular  . SCC (squamous cell carcinoma) 06/22/2008   SCC In Situ Below Right Ear  . SCC (squamous cell carcinoma) 11/04/2012   SCC In Situ Lower Right Forearm  . SCC (squamous cell carcinoma) 02/11/2014   SCC Left Nasal Blub  . SCC (squamous cell carcinoma) 11/14/2015   SCC In Situ Left Side Neck  . Visit for monitoring Tikosyn therapy 08/12/2017    ROS:   All systems reviewed and  negative except as noted in the HPI.   Past Surgical History:  Procedure Laterality Date  . CARDIAC CATHETERIZATION    . CARDIOVERSION N/A 08/27/2013   Procedure: CARDIOVERSION;  Surgeon: Sueanne Margarita, MD;  Location: [facility] ENDOSCOPY;  Service: Cardiovascular;  Laterality: N/A;  . CARDIOVERSION N/A 03/15/2016   Procedure: CARDIOVERSION;  Surgeon: Dorothy Spark, MD;  Location: [place];  Service: Cardiovascular;  Laterality: N/A;  . CARDIOVERSION N/A 10/30/2016   Procedure: CARDIOVERSION;  Surgeon: Jerline Pain, MD;  Location: [facility] ENDOSCOPY;  Service: Cardiovascular;  Laterality: N/A;  . CORONARY ANGIOPLASTY    . JOINT REPLACEMENT     left knee replacement 2009  . TEE WITHOUT CARDIOVERSION N/A 10/30/2016   Procedure: TRANSESOPHAGEAL ECHOCARDIOGRAM (TEE);  Surgeon: Jerline Pain, MD;  Location: [facility] ENDOSCOPY;  Service: Cardiovascular;  Laterality: N/A;     Family History  Problem Relation Age of Onset  . Diabetes Mother   . Peripheral vascular disease Father   . Heart attack Father   . Coronary artery disease Brother   . Liver cancer Brother   . Coronary artery disease Brother   . Heart attack Brother   . Cancer Brother   . Sudden death Brother   . Fainting Brother      Social History   Socioeconomic History  . Marital status: Married    Spouse name: Not on file  . Number of children: Not on file  . Years of education: Not on file  . Highest education level: Not on file  Occupational History  . Not on file  Tobacco Use  . Smoking status: Former Smoker    Quit date: 01/28/1958    Years since quitting: 61.2  . Smokeless tobacco: Never Used  Substance and Sexual Activity  . Alcohol use: Yes    Comment: "a few a week"  . Drug use: No  . Sexual activity: Not on file  Other Topics Concern  . Not on file  Social History Narrative  . Not on file   Social Determinants of Health   Financial Resource Strain:   . Difficulty of Paying Living Expenses: Not on file    Food Insecurity:   . Worried About Charity fundraiser in the Last Year: Not on file  . Ran Out of Food in the Last Year: Not on file  Transportation Needs:   . Lack of Transportation (Medical): Not on file  . Lack of Transportation (Non-Medical): Not on file  Physical Activity:   . Days of Exercise per Week: Not on file  . Minutes  of Exercise per Session: Not on file  Stress:   . Feeling of Stress : Not on file  Social Connections:   . Frequency of Communication with Friends and Family: Not on file  . Frequency of Social Gatherings with Friends and Family: Not on file  . Attends Religious Services: Not on file  . Active Member of Clubs or Organizations: Not on file  . Attends Archivist Meetings: Not on file  . Marital Status: Not on file  Intimate Partner Violence:   . Fear of Current or Ex-Partner: Not on file  . Emotionally Abused: Not on file  . Physically Abused: Not on file  . Sexually Abused: Not on file     BP 132/70   Pulse (!) 50   Ht 6\' 2"  (1.88 m)   Wt 251 lb 12.8 oz (114.2 kg)   SpO2 96%   BMI 32.33 kg/m   Physical Exam:  Well appearing NAD HEENT: Unremarkable Neck:  No JVD, no thyromegally Lymphatics:  No adenopathy Back:  No CVA tenderness Lungs:  Clear with no wheezes HEART:  Regular rate rhythm, 2/6 systolic murmur, no rubs, no clicks Abd:  soft, positive bowel sounds, no organomegally, no rebound, no guarding Ext:  2 plus pulses, no edema, no cyanosis, no clubbing Skin:  No rashes no nodules Neuro:  CN II through XII intact, motor grossly intact  EKG - sinus bradycardia  Assess/Plan: 1. PAF - he is maintaining NSR. He will continue dofetilide.  2. HTN - his bp is up a bit along with his weight. He will continue his current meds but I have encouraged him to lose weight. 3. Obesity - he has gained 15 lbs in the past 6 months and I have encouraged him to lose it back. 4. Sinus node dysfunction - he has resting bradycardia. He is  asymptomatic.   Randall Chen.D.

## 2019-04-21 DIAGNOSIS — H33322 Round hole, left eye: Secondary | ICD-10-CM | POA: Diagnosis not present

## 2019-04-21 DIAGNOSIS — H35013 Changes in retinal vascular appearance, bilateral: Secondary | ICD-10-CM | POA: Diagnosis not present

## 2019-04-21 DIAGNOSIS — H43813 Vitreous degeneration, bilateral: Secondary | ICD-10-CM | POA: Diagnosis not present

## 2019-04-21 DIAGNOSIS — H35363 Drusen (degenerative) of macula, bilateral: Secondary | ICD-10-CM | POA: Diagnosis not present

## 2019-05-05 ENCOUNTER — Other Ambulatory Visit: Payer: Self-pay

## 2019-05-05 MED ORDER — DOFETILIDE 250 MCG PO CAPS: ORAL_CAPSULE | ORAL | 1 refills | Status: DC

## 2019-05-19 DIAGNOSIS — N400 Enlarged prostate without lower urinary tract symptoms: Secondary | ICD-10-CM | POA: Diagnosis not present

## 2019-05-19 DIAGNOSIS — I10 Essential (primary) hypertension: Secondary | ICD-10-CM | POA: Diagnosis not present

## 2019-05-19 DIAGNOSIS — M159 Polyosteoarthritis, unspecified: Secondary | ICD-10-CM | POA: Diagnosis not present

## 2019-05-19 DIAGNOSIS — I2510 Atherosclerotic heart disease of native coronary artery without angina pectoris: Secondary | ICD-10-CM | POA: Diagnosis not present

## 2019-05-19 DIAGNOSIS — G4700 Insomnia, unspecified: Secondary | ICD-10-CM | POA: Diagnosis not present

## 2019-05-19 DIAGNOSIS — E7800 Pure hypercholesterolemia, unspecified: Secondary | ICD-10-CM | POA: Diagnosis not present

## 2019-05-19 DIAGNOSIS — I4819 Other persistent atrial fibrillation: Secondary | ICD-10-CM | POA: Diagnosis not present

## 2019-05-19 DIAGNOSIS — Z8546 Personal history of malignant neoplasm of prostate: Secondary | ICD-10-CM | POA: Diagnosis not present

## 2019-06-29 DIAGNOSIS — G4733 Obstructive sleep apnea (adult) (pediatric): Secondary | ICD-10-CM | POA: Diagnosis not present

## 2019-07-27 DIAGNOSIS — E7800 Pure hypercholesterolemia, unspecified: Secondary | ICD-10-CM | POA: Diagnosis not present

## 2019-07-27 DIAGNOSIS — G4700 Insomnia, unspecified: Secondary | ICD-10-CM | POA: Diagnosis not present

## 2019-07-27 DIAGNOSIS — I2510 Atherosclerotic heart disease of native coronary artery without angina pectoris: Secondary | ICD-10-CM | POA: Diagnosis not present

## 2019-07-27 DIAGNOSIS — I4819 Other persistent atrial fibrillation: Secondary | ICD-10-CM | POA: Diagnosis not present

## 2019-07-27 DIAGNOSIS — M159 Polyosteoarthritis, unspecified: Secondary | ICD-10-CM | POA: Diagnosis not present

## 2019-07-27 DIAGNOSIS — Z8546 Personal history of malignant neoplasm of prostate: Secondary | ICD-10-CM | POA: Diagnosis not present

## 2019-07-27 DIAGNOSIS — I10 Essential (primary) hypertension: Secondary | ICD-10-CM | POA: Diagnosis not present

## 2019-07-27 DIAGNOSIS — N400 Enlarged prostate without lower urinary tract symptoms: Secondary | ICD-10-CM | POA: Diagnosis not present

## 2019-08-13 ENCOUNTER — Other Ambulatory Visit: Payer: Self-pay

## 2019-08-13 MED ORDER — ROSUVASTATIN CALCIUM 5 MG PO TABS: 5.0000 mg | ORAL_TABLET | ORAL | 0 refills | Status: DC

## 2019-08-18 ENCOUNTER — Encounter: Payer: Self-pay | Admitting: Cardiology

## 2019-08-18 NOTE — Telephone Encounter (Signed)
error 

## 2019-08-26 DIAGNOSIS — G4733 Obstructive sleep apnea (adult) (pediatric): Secondary | ICD-10-CM | POA: Diagnosis not present

## 2019-09-15 DIAGNOSIS — H524 Presbyopia: Secondary | ICD-10-CM | POA: Diagnosis not present

## 2019-09-15 DIAGNOSIS — H18593 Other hereditary corneal dystrophies, bilateral: Secondary | ICD-10-CM | POA: Diagnosis not present

## 2019-09-15 DIAGNOSIS — H35363 Drusen (degenerative) of macula, bilateral: Secondary | ICD-10-CM | POA: Diagnosis not present

## 2019-09-15 DIAGNOSIS — H04123 Dry eye syndrome of bilateral lacrimal glands: Secondary | ICD-10-CM | POA: Diagnosis not present

## 2019-10-01 ENCOUNTER — Ambulatory Visit (HOSPITAL_COMMUNITY): Payer: PPO | Attending: Cardiology

## 2019-10-01 ENCOUNTER — Other Ambulatory Visit: Payer: Self-pay

## 2019-10-01 ENCOUNTER — Telehealth: Payer: Self-pay

## 2019-10-01 DIAGNOSIS — I77810 Thoracic aortic ectasia: Secondary | ICD-10-CM | POA: Diagnosis not present

## 2019-10-01 DIAGNOSIS — I712 Thoracic aortic aneurysm, without rupture, unspecified: Secondary | ICD-10-CM

## 2019-10-01 LAB — ECHOCARDIOGRAM COMPLETE
AR max vel: 1.7 cm2
AV Area VTI: 2.1 cm2
AV Area mean vel: 1.88 cm2
AV Mean grad: 11 mmHg
AV Peak grad: 15.8 mmHg
Ao pk vel: 1.99 m/s
Area-P 1/2: 3.27 cm2
S' Lateral: 2.8 cm

## 2019-10-01 MED ORDER — PERFLUTREN LIPID MICROSPHERE
1.0000 mL | INTRAVENOUS | Status: AC | PRN
  Administered 2019-10-01: 2 mL via INTRAVENOUS

## 2019-10-01 NOTE — Telephone Encounter (Signed)
-----   Message from Sueanne Margarita, MD sent at 10/01/2019  1:19 PM EDT ----- Echo showed normal LVF with mildly thickened and stiff heart muscle, mildly enlarged RV, mild AS and mildly dilated ascending aorta in creased from 38 to 39mm. Please get a Chest CTA in 6 months for ascending aortic aneurysm and repeat echo in 1 year

## 2019-10-01 NOTE — Telephone Encounter (Signed)
The patient has been notified of the result and verbalized understanding.  All questions (if any) were answered. Antonieta Iba, RN 10/01/2019 4:25 PM  Orders placed for echo in one year and CT in 6 months.

## 2019-10-05 NOTE — Progress Notes (Signed)
Virtual Visit via Video Note   This visit type was conducted due to national recommendations for restrictions regarding the COVID-19 Pandemic (e.g. social distancing) in an effort to limit this patient's exposure and mitigate transmission in our community.  Due to his co-morbid illnesses, this patient is at least at moderate risk for complications without adequate follow up.  This format is felt to be most appropriate for this patient at this time.  All issues noted in this document were discussed and addressed.  A limited physical exam was performed with this format.  Please refer to the patient's chart for his consent to telehealth for [facility].   Evaluation Performed:  Follow-up visit  This visit type was conducted due to national recommendations for restrictions regarding the COVID-19 Pandemic (e.g. social distancing).  This format is felt to be most appropriate for this patient at this time.  All issues noted in this document were discussed and addressed.  No physical exam was performed (except for noted visual exam findings with Video Visits).  Please refer to the patient's chart (MyChart message for video visits and phone note for telephone visits) for the patient's consent to telehealth for [facility].  Date:  10/06/2019   ID:  [name], DOB 1932/03/30, MRN [id number]  Patient Location:  Home  Provider location:   [place]  PCP:  Lavone Orn, MD  Sleep Medicine:  Fransico Him, MD Electrophysiologist:  None   Chief Complaint:  OSA, HTN, CAD  History of Present Illness:    DEMONTE Chen is a 86 y.o. male who presents via audio/video conferencing for a telehealth visit today.    Niranjan Rufener Bennettis an 86 y.o.malewith a hx of OSA on CPAP, HTN, ASCAD s/p inferior MI 2003 with PCI of RCA 6/03 and then PCI of left circ/OM 2/05, dyslipidemia, bicuspid AVwith mild AS, mildly dilated aorta and PAF followed by Dr. Lovena Le.  He had a reoccurrence of PAF in Feb 2018  and underwent successful DCCV to NSR. He was seen in the afib clinic 10/14/2016 with complaints of palpitations for 1 week after a gambling weekend in [place] where he drank more ETOH than usual. Due to subtherapeutic INR, he underwent TEE/DCCV to NSR on 10/30/2016.He was seen back in afib clinic in Feb 2019 with recurrent afib. ADD therapy was discussed with only option being Tikosyn. He has bradycardia so Sotolol was not an option.  He was subsequently loaded on dofetilide.   He is here today for followup and is doing well.  He denies any chest pain or pressure, SOB, DOE (except walking up a long flight of stairs), PND, orthopnea, LE edema, dizziness, palpitations or syncope. He is compliant with his meds and is tolerating meds with no SE.  He is doing well with his CPAP device and thinks that he has gotten used to it.  Occasionally the air will blow in his eye and it annoys him but he is tolerating it ok. He tolerates the mask and feels the pressure is adequate.  Since going on CPAP He feels rested in the am and has no significant daytime sleepiness if he has slept well the night before.  He denies any significant mouth or nasal dryness or nasal congestion.  He does not think that he snores.    The patient does not have symptoms concerning for COVID-19 infection (fever, chills, cough, or new shortness of breath).    Prior CV studies:   The following studies were reviewed today:  PAP compliance download  Past Medical History:  Diagnosis Date  . Aortic stenosis    mild by echo 09/2017  . BCC (basal cell carcinoma of skin) 05/19/2001   Pigmented BCC Left Outer mouth  . BCC (basal cell carcinoma of skin) 06/22/2008   BCC Upper Left Back  . BCC (basal cell carcinoma) 11/04/2012   BCC Superficial Mid Forehead   . BCC (basal cell carcinoma) 12/02/2013   BCC Mid Forehead  . Coronary disease    2 Vessel,inferior wall MI june 2003, PTCA and Stent RCA June 2003, PTCA stent, Left Circumflex OM 02 Mar 2003, Dr Radford Pax  . Dilated aortic root ([place])    80mm by echo 09/2016  . DJD (degenerative joint disease) of knee    LEFT  . Dyslipidemia   . GERD (gastroesophageal reflux disease)   . History of rotator cuff syndrome    (Right) with biceps tendinitis  . Hypertension   . Nodular basal cell carcinoma (BCC) 10/13/2013   Nod BCC Mid Forehead  . Nodular basal cell carcinoma (BCC) 11/14/2015   BCC Nod. Mid Forehead  . Nodular basal cell carcinoma (BCC) 07/16/2016   Left Forehead Med  . Nodular basal cell carcinoma (BCC) 07/16/2016   Left Forehead Lat  . Obesity   . OSA (obstructive sleep apnea)    presented with HA and drowsiness,mild sleep apnea with oxygen desats to 78%  . Persistent atrial fibrillation ([place])   . Prostate cancer ([place])    /BPH, radioactive seed 2007, Nesi   . SCC (squamous cell carcinoma) 06/22/2008   SCC In Situ Left Sideburn  . SCC (squamous cell carcinoma) 06/22/2008   SCC In Situ Left Preauricular  . SCC (squamous cell carcinoma) 06/22/2008   SCC In Situ Below Right Ear  . SCC (squamous cell carcinoma) 11/04/2012   SCC In Situ Lower Right Forearm  . SCC (squamous cell carcinoma) 02/11/2014   SCC Left Nasal Blub  . SCC (squamous cell carcinoma) 11/14/2015   SCC In Situ Left Side Neck  . Visit for monitoring Tikosyn therapy 08/12/2017   Past Surgical History:  Procedure Laterality Date  . CARDIAC CATHETERIZATION    . CARDIOVERSION N/A 08/27/2013   Procedure: CARDIOVERSION;  Surgeon: Sueanne Margarita, MD;  Location: [facility] ENDOSCOPY;  Service: Cardiovascular;  Laterality: N/A;  . CARDIOVERSION N/A 03/15/2016   Procedure: CARDIOVERSION;  Surgeon: Dorothy Spark, MD;  Location: [place];  Service: Cardiovascular;  Laterality: N/A;  . CARDIOVERSION N/A 10/30/2016   Procedure: CARDIOVERSION;  Surgeon: Jerline Pain, MD;  Location: [facility] ENDOSCOPY;  Service: Cardiovascular;  Laterality: N/A;  . CORONARY ANGIOPLASTY    . JOINT REPLACEMENT     left knee  replacement 2009  . TEE WITHOUT CARDIOVERSION N/A 10/30/2016   Procedure: TRANSESOPHAGEAL ECHOCARDIOGRAM (TEE);  Surgeon: Jerline Pain, MD;  Location: [facility] ENDOSCOPY;  Service: Cardiovascular;  Laterality: N/A;     Current Meds  Medication Sig  . apixaban (ELIQUIS) 5 MG TABS tablet Take 1 tablet (5 mg total) by mouth 2 (two) times daily.  Marland Kitchen dofetilide (TIKOSYN) 250 MCG capsule TAKE 1 CAPSULE(250 MCG) BY MOUTH TWICE DAILY  . ezetimibe (ZETIA) 10 MG tablet TAKE 1 TABLET(10 MG) BY MOUTH DAILY  . fluticasone (FLONASE) 50 MCG/ACT nasal spray Place 1 spray into both nostrils daily as needed for allergies or rhinitis.  . magnesium oxide (MAG-OX) 400 MG tablet Take 1 tablet (400 mg total) by mouth daily.  . metoprolol tartrate (LOPRESSOR) 25 MG tablet TAKE 1  TABLET BY MOUTH TWICE DAILY  . nitroGLYCERIN (NITROSTAT) 0.4 MG SL tablet Place 1 tablet (0.4 mg total) under the tongue every 5 (five) minutes as needed for chest pain.  Marland Kitchen omeprazole (PRILOSEC) 40 MG capsule Take 40 mg by mouth daily as needed (heartburn).  . rosuvastatin (CRESTOR) 5 MG tablet Take 1 tablet (5 mg total) by mouth 3 (three) times a week. Please make yearly appt with Dr. Radford Pax for September for refills. 1st attempt  . tamsulosin (FLOMAX) 0.4 MG CAPS capsule Take 0.4 mg by mouth daily.  . vitamin B-12 (CYANOCOBALAMIN) 100 MCG tablet Take 100 mcg by mouth daily.  Marland Kitchen zolpidem (AMBIEN) 5 MG tablet Take 2.5-5 mg by mouth at bedtime as needed for sleep.      Allergies:   Prednisone, Zocor [simvastatin], Yellow jacket venom [bee venom], Hydrocodone, and Oxycodone hcl   Social History   Tobacco Use  . Smoking status: Former Smoker    Quit date: 01/28/1958    Years since quitting: 61.7  . Smokeless tobacco: Never Used  Vaping Use  . Vaping Use: Never used  Substance Use Topics  . Alcohol use: Yes    Comment: "a few a week"  . Drug use: No     Family Hx: The patient's family history includes Cancer in his brother; Coronary artery  disease in his brother and brother; Diabetes in his mother; Fainting in his brother; Heart attack in his brother and father; Liver cancer in his brother; Peripheral vascular disease in his father; Sudden death in his brother.  ROS:   Please see the history of present illness.     All other systems reviewed and are negative.   Labs/Other Tests and Data Reviewed:    Recent Labs: 10/08/2018: Hemoglobin 13.5; Platelets 164   Recent Lipid Panel Lab Results  Component Value Date/Time   CHOL 110 09/29/2018 08:12 AM   CHOL 101 06/17/2013 08:21 AM   TRIG 68 09/29/2018 08:12 AM   TRIG 75 06/17/2013 08:21 AM   HDL 45 09/29/2018 08:12 AM   HDL 42 06/17/2013 08:21 AM   CHOLHDL 2.4 09/29/2018 08:12 AM   CHOLHDL 2.7 09/18/2015 09:02 AM   LDLCALC 51 09/29/2018 08:12 AM   LDLCALC 44 06/17/2013 08:21 AM    Wt Readings from Last 3 Encounters:  10/06/19 248 lb (112.5 kg)  04/06/19 251 lb 12.8 oz (114.2 kg)  10/06/18 237 lb (107.5 kg)     Objective:    Vital Signs:  BP (!) 173/83   Pulse (!) 51   Ht 6\' 2"  (1.88 m)   Wt 248 lb (112.5 kg)   BMI 31.84 kg/m     ASSESSMENT & PLAN:    1.  OSA -  The patient is tolerating PAP therapy well without any problems. The PAP download was reviewed today and showed an AHI of 3.9/hr on 7 cm H2O with 100% compliance in using more than 4 hours nightly.  The patient has been using and benefiting from PAP use and will continue to benefit from therapy.   2.  HTN -BP is elevated today -I have asked him to check his BP daily for a week and call with the results -His BP goal is < 130/43mmHg -we are decreasing his Lopressor 12.5mg  BID -add Irbesartan 150mg  daily and titrate as needed -BP check daily for a week and call with the results -check BMET in 1 week  3.  Obesity  -I have encouraged him to get into a routine exercise program  and cut back on carbs and portions.   4.  ASCAD  -s/p inferior MI 2003 with PCI of RCA 6/03 and then PCI of left circ/OM  2/05.   -he has not had any anginal symptoms -continue BB and statin.  No ASA due to DOAC  5.  Mild AS  - stable by echo 09/2018  6.  Hyperlipidemia  -LDL goal is less than 70.   -Last LDL was 51 on 09/2018.   -continue crestor 5mg  3 times weekly and Zetia 10mg  daily.  7.  Paroxysmal atrial fibrillation  -he denies any palpitations  -continue on Tikosyn 250 mcg twice daily  -he is concerned that his HR is in the 50's although he is not feeling badly but HR does get down into the 40's -continue Apixaban 5mg  BID.   -check CBC and BMET  8. Dilated aortic root  - Echo 09/2019  Aortic root increased from 38 to 45mm -Continue statin and BP control -he has chest CTA ordered in 6 months  COVID-19 Education: The signs and symptoms of COVID-19 were discussed with the patient and how to seek care for testing (follow up with PCP or arrange E-visit).  The importance of social distancing was discussed today.  Patient Risk:   After full review of this patient's clinical status, I feel that they are at least moderate risk at this time.  Time:   Today, I have spent 20 minutes on telemedicine discussing medical problems including CAD, PAF, dilated aortic root, OSA and eviewing patient's chart including 2D echo and labs.  Medication Adjustments/Labs and Tests Ordered: Current medicines are reviewed at length with the patient today.  Concerns regarding medicines are outlined above.  Tests Ordered: No orders of the defined types were placed in this encounter.  Medication Changes: No orders of the defined types were placed in this encounter.   Disposition:  Follow up in 1 year(s)  Signed, Fransico Him, MD  10/06/2019 9:38 AM    [facility]

## 2019-10-06 ENCOUNTER — Encounter: Payer: Self-pay | Admitting: Cardiology

## 2019-10-06 ENCOUNTER — Telehealth (INDEPENDENT_AMBULATORY_CARE_PROVIDER_SITE_OTHER): Payer: PPO | Admitting: Cardiology

## 2019-10-06 ENCOUNTER — Other Ambulatory Visit: Payer: Self-pay

## 2019-10-06 VITALS — BP 173/83 | HR 51 | Ht 74.0 in | Wt 248.0 lb

## 2019-10-06 DIAGNOSIS — I2510 Atherosclerotic heart disease of native coronary artery without angina pectoris: Secondary | ICD-10-CM

## 2019-10-06 DIAGNOSIS — E7800 Pure hypercholesterolemia, unspecified: Secondary | ICD-10-CM | POA: Diagnosis not present

## 2019-10-06 DIAGNOSIS — I10 Essential (primary) hypertension: Secondary | ICD-10-CM | POA: Diagnosis not present

## 2019-10-06 DIAGNOSIS — I350 Nonrheumatic aortic (valve) stenosis: Secondary | ICD-10-CM | POA: Diagnosis not present

## 2019-10-06 DIAGNOSIS — I480 Paroxysmal atrial fibrillation: Secondary | ICD-10-CM

## 2019-10-06 DIAGNOSIS — G4733 Obstructive sleep apnea (adult) (pediatric): Secondary | ICD-10-CM | POA: Diagnosis not present

## 2019-10-06 DIAGNOSIS — E669 Obesity, unspecified: Secondary | ICD-10-CM | POA: Diagnosis not present

## 2019-10-06 DIAGNOSIS — I77810 Thoracic aortic ectasia: Secondary | ICD-10-CM

## 2019-10-06 MED ORDER — METOPROLOL TARTRATE 25 MG PO TABS: 12.5000 mg | ORAL_TABLET | Freq: Two times a day (BID) | ORAL | 3 refills | Status: DC

## 2019-10-06 MED ORDER — IRBESARTAN 150 MG PO TABS: 150.0000 mg | ORAL_TABLET | Freq: Every day | ORAL | 3 refills | Status: DC

## 2019-10-06 NOTE — Addendum Note (Signed)
Addended by: Antonieta Iba on: 10/06/2019 10:02 AM   Modules accepted: Orders

## 2019-10-06 NOTE — Patient Instructions (Addendum)
Please check your blood pressure daily around lunch time for a week and call us/send a message with your readings.   Medication Instructions:  Your physician has recommended you make the following change in your medication:  1) DECREASE Lopressor (metoprolol tartrate) to 12.5 mg twice per day  2) START taking irbesartan 150 mg daily  *If you need a refill on your cardiac medications before your next appointment, please call your pharmacy*   Lab Work: Fasting lipids, CMET, and CBC in one week. If you have labs (blood work) drawn today and your tests are completely normal, you will receive your results only by: Marland Kitchen MyChart Message (if you have MyChart) OR . A paper copy in the mail If you have any lab test that is abnormal or we need to change your treatment, we will call you to review the results.  Follow-Up: At [facility], you and your health needs are our priority.  As part of our continuing mission to provide you with exceptional heart care, we have created designated Provider Care Teams.  These Care Teams include your primary Cardiologist (physician) and Advanced Practice Providers (APPs -  Physician Assistants and Nurse Practitioners) who all work together to provide you with the care you need, when you need it.  Your next appointment:   1 year(s)  The format for your next appointment:   In Person  Provider:   You may see Fransico Him, MD or one of the following Advanced Practice Providers on your designated Care Team:    Melina Copa, PA-C  Ermalinda Barrios, PA-C  Other Information: Please contact [facility] at [phone] for patient assistance for your Eliquis.

## 2019-10-15 ENCOUNTER — Other Ambulatory Visit: Payer: PPO | Admitting: *Deleted

## 2019-10-15 ENCOUNTER — Other Ambulatory Visit: Payer: Self-pay

## 2019-10-15 ENCOUNTER — Other Ambulatory Visit: Payer: Self-pay | Admitting: *Deleted

## 2019-10-15 DIAGNOSIS — E7800 Pure hypercholesterolemia, unspecified: Secondary | ICD-10-CM

## 2019-10-15 DIAGNOSIS — I10 Essential (primary) hypertension: Secondary | ICD-10-CM

## 2019-10-15 DIAGNOSIS — I480 Paroxysmal atrial fibrillation: Secondary | ICD-10-CM | POA: Diagnosis not present

## 2019-10-15 LAB — CBC
Hematocrit: 41.4 % (ref 37.5–51.0)
Hemoglobin: 13.9 g/dL (ref 13.0–17.7)
MCH: 30.8 pg (ref 26.6–33.0)
MCHC: 33.6 g/dL (ref 31.5–35.7)
MCV: 92 fL (ref 79–97)
Platelets: 175 10*3/uL (ref 150–450)
RBC: 4.51 x10E6/uL (ref 4.14–5.80)
RDW: 13 % (ref 11.6–15.4)
WBC: 5.5 10*3/uL (ref 3.4–10.8)

## 2019-10-15 LAB — COMPREHENSIVE METABOLIC PANEL
ALT: 13 IU/L (ref 0–44)
AST: 20 IU/L (ref 0–40)
Albumin/Globulin Ratio: 1.3 (ref 1.2–2.2)
Albumin: 4 g/dL (ref 3.6–4.6)
Alkaline Phosphatase: 80 IU/L (ref 44–121)
BUN/Creatinine Ratio: 20 (ref 10–24)
BUN: 26 mg/dL (ref 8–27)
Bilirubin Total: 0.3 mg/dL (ref 0.0–1.2)
CO2: 24 mmol/L (ref 20–29)
Calcium: 9.5 mg/dL (ref 8.6–10.2)
Chloride: 106 mmol/L (ref 96–106)
Creatinine, Ser: 1.33 mg/dL — ABNORMAL HIGH (ref 0.76–1.27)
GFR calc Af Amer: 56 mL/min/{1.73_m2} — ABNORMAL LOW (ref >59)
GFR calc non Af Amer: 48 mL/min/{1.73_m2} — ABNORMAL LOW (ref >59)
Globulin, Total: 3.1 g/dL (ref 1.5–4.5)
Glucose: 106 mg/dL — ABNORMAL HIGH (ref 65–99)
Potassium: 4.7 mmol/L (ref 3.5–5.2)
Sodium: 142 mmol/L (ref 134–144)
Total Protein: 7.1 g/dL (ref 6.0–8.5)

## 2019-10-15 LAB — LIPID PANEL
Chol/HDL Ratio: 2.8 ratio (ref 0.0–5.0)
Cholesterol, Total: 116 mg/dL (ref 100–199)
HDL: 41 mg/dL (ref >39)
LDL Chol Calc (NIH): 58 mg/dL (ref 0–99)
Triglycerides: 87 mg/dL (ref 0–149)
VLDL Cholesterol Cal: 17 mg/dL (ref 5–40)

## 2019-10-15 MED ORDER — ROSUVASTATIN CALCIUM 5 MG PO TABS
5.0000 mg | ORAL_TABLET | ORAL | 0 refills | Status: DC
Start: 2019-10-15 — End: 2019-12-20

## 2019-10-18 ENCOUNTER — Telehealth: Payer: Self-pay

## 2019-10-18 DIAGNOSIS — I10 Essential (primary) hypertension: Secondary | ICD-10-CM

## 2019-10-18 NOTE — Telephone Encounter (Signed)
-----   Message from Sueanne Margarita, MD sent at 10/18/2019 11:54 AM EDT ----- Try to drink 64oz of fluids daily and repeat BMET on Friday  Traci ----- Message ----- From: Antonieta Iba, RN Sent: 10/18/2019  11:41 AM EDT To: Sueanne Margarita, MD  Patient viewed results through [place].  See message below from patient - he has not been taking any NSAIDs  "The only medicine of that general order that I occasionally take is acetominophen.  I did decide after seeing those results to be more careful about my diet, reducing salt intake, choosing foods that are low in potassium and phosphorus.  Maybe I'll even lose a few pounds.  Incidentally I reported my weight incorrectly during the virtual visit.  It is closer to 230. "

## 2019-10-21 DIAGNOSIS — I10 Essential (primary) hypertension: Secondary | ICD-10-CM

## 2019-10-22 ENCOUNTER — Other Ambulatory Visit: Payer: Self-pay

## 2019-10-22 ENCOUNTER — Other Ambulatory Visit: Payer: PPO | Admitting: *Deleted

## 2019-10-22 DIAGNOSIS — I10 Essential (primary) hypertension: Secondary | ICD-10-CM | POA: Diagnosis not present

## 2019-10-22 LAB — BASIC METABOLIC PANEL
BUN/Creatinine Ratio: 22 (ref 10–24)
BUN: 29 mg/dL — ABNORMAL HIGH (ref 8–27)
CO2: 23 mmol/L (ref 20–29)
Calcium: 9.8 mg/dL (ref 8.6–10.2)
Chloride: 101 mmol/L (ref 96–106)
Creatinine, Ser: 1.31 mg/dL — ABNORMAL HIGH (ref 0.76–1.27)
GFR calc Af Amer: 57 mL/min/{1.73_m2} — ABNORMAL LOW (ref >59)
GFR calc non Af Amer: 49 mL/min/{1.73_m2} — ABNORMAL LOW (ref >59)
Glucose: 98 mg/dL (ref 65–99)
Potassium: 4.6 mmol/L (ref 3.5–5.2)
Sodium: 136 mmol/L (ref 134–144)

## 2019-10-25 DIAGNOSIS — G4700 Insomnia, unspecified: Secondary | ICD-10-CM | POA: Diagnosis not present

## 2019-10-25 DIAGNOSIS — I10 Essential (primary) hypertension: Secondary | ICD-10-CM | POA: Diagnosis not present

## 2019-10-25 DIAGNOSIS — E7800 Pure hypercholesterolemia, unspecified: Secondary | ICD-10-CM | POA: Diagnosis not present

## 2019-10-25 DIAGNOSIS — Z8546 Personal history of malignant neoplasm of prostate: Secondary | ICD-10-CM | POA: Diagnosis not present

## 2019-10-25 DIAGNOSIS — I2510 Atherosclerotic heart disease of native coronary artery without angina pectoris: Secondary | ICD-10-CM | POA: Diagnosis not present

## 2019-10-25 DIAGNOSIS — N400 Enlarged prostate without lower urinary tract symptoms: Secondary | ICD-10-CM | POA: Diagnosis not present

## 2019-10-25 DIAGNOSIS — M159 Polyosteoarthritis, unspecified: Secondary | ICD-10-CM | POA: Diagnosis not present

## 2019-10-25 DIAGNOSIS — I4819 Other persistent atrial fibrillation: Secondary | ICD-10-CM | POA: Diagnosis not present

## 2019-10-25 NOTE — Telephone Encounter (Signed)
See result note on lab work from 9/24  Creatinine has bumped on Avapro - please stop Avapro and check BP twice daily for a week and call with results     Result comments released to patient.  I also replied to this my chart message with the information.

## 2019-10-28 ENCOUNTER — Other Ambulatory Visit: Payer: Self-pay | Admitting: Cardiology

## 2019-10-28 NOTE — Telephone Encounter (Signed)
Pt last saw Dr Lovena Le 04/06/19, last labs 10/22/19 Creat 1.31, age 86, weight 112.5kg, based on specified criteria pt is on appropriate dosage of Eliquis 5mg  BID.  Will refill rx.

## 2019-11-02 ENCOUNTER — Other Ambulatory Visit: Payer: PPO

## 2019-11-05 ENCOUNTER — Other Ambulatory Visit: Payer: PPO | Admitting: *Deleted

## 2019-11-05 ENCOUNTER — Other Ambulatory Visit: Payer: Self-pay

## 2019-11-05 DIAGNOSIS — I10 Essential (primary) hypertension: Secondary | ICD-10-CM

## 2019-11-05 LAB — BASIC METABOLIC PANEL
BUN/Creatinine Ratio: 17 (ref 10–24)
BUN: 22 mg/dL (ref 8–27)
CO2: 21 mmol/L (ref 20–29)
Calcium: 9.2 mg/dL (ref 8.6–10.2)
Chloride: 107 mmol/L — ABNORMAL HIGH (ref 96–106)
Creatinine, Ser: 1.28 mg/dL — ABNORMAL HIGH (ref 0.76–1.27)
GFR calc Af Amer: 59 mL/min/{1.73_m2} — ABNORMAL LOW (ref >59)
GFR calc non Af Amer: 51 mL/min/{1.73_m2} — ABNORMAL LOW (ref >59)
Glucose: 101 mg/dL — ABNORMAL HIGH (ref 65–99)
Potassium: 4.4 mmol/L (ref 3.5–5.2)
Sodium: 141 mmol/L (ref 134–144)

## 2019-11-09 ENCOUNTER — Ambulatory Visit: Payer: PPO | Admitting: Internal Medicine

## 2019-11-09 ENCOUNTER — Encounter: Payer: Self-pay | Admitting: Internal Medicine

## 2019-11-09 ENCOUNTER — Other Ambulatory Visit: Payer: Self-pay

## 2019-11-09 VITALS — BP 122/72 | HR 63 | Ht 74.0 in | Wt 240.6 lb

## 2019-11-09 DIAGNOSIS — I480 Paroxysmal atrial fibrillation: Secondary | ICD-10-CM | POA: Diagnosis not present

## 2019-11-09 DIAGNOSIS — I495 Sick sinus syndrome: Secondary | ICD-10-CM | POA: Diagnosis not present

## 2019-11-09 NOTE — Patient Instructions (Addendum)

## 2019-11-09 NOTE — Progress Notes (Signed)
HPI Mr. Towson returns today for followup of his atrial fib. He had done well with no symptoms on dofetilide until several days ago when he had been drinking red wine and then developed atrial fib. He remains out of rhythm about 24 hours. He spontaneously reverted back to NSR. He denies medical non-compliance.  Allergies  Allergen Reactions  . Prednisone Other (See Comments)    anxiety  . Zocor [Simvastatin] Other (See Comments)    Muscle aches  . Yellow Jacket Venom [Bee Venom] Swelling    Swelling and raised bumps at site  . Hydrocodone Rash  . Oxycodone Hcl Rash     Current Outpatient Medications  Medication Sig Dispense Refill  . dofetilide (TIKOSYN) 250 MCG capsule TAKE 1 CAPSULE(250 MCG) BY MOUTH TWICE DAILY 180 capsule 1  . ELIQUIS 5 MG TABS tablet Take 1 tablet by mouth twice a day 180 tablet 1  . ezetimibe (ZETIA) 10 MG tablet TAKE 1 TABLET(10 MG) BY MOUTH DAILY 90 tablet 3  . fluticasone (FLONASE) 50 MCG/ACT nasal spray Place 1 spray into both nostrils daily as needed for allergies or rhinitis.    . magnesium oxide (MAG-OX) 400 MG tablet Take 1 tablet (400 mg total) by mouth daily. 90 tablet 1  . metoprolol tartrate (LOPRESSOR) 25 MG tablet Take 0.5 tablets (12.5 mg total) by mouth 2 (two) times daily. 90 tablet 3  . nitroGLYCERIN (NITROSTAT) 0.4 MG SL tablet Place 1 tablet (0.4 mg total) under the tongue every 5 (five) minutes as needed for chest pain. 25 tablet 5  . omeprazole (PRILOSEC) 40 MG capsule Take 40 mg by mouth daily as needed (heartburn).    . rosuvastatin (CRESTOR) 5 MG tablet Take 1 tablet (5 mg total) by mouth 3 (three) times a week. Please make yearly appt with Dr. Radford Pax for September for refills. 1st attempt 36 tablet 0  . tamsulosin (FLOMAX) 0.4 MG CAPS capsule Take 0.4 mg by mouth daily.    . vitamin B-12 (CYANOCOBALAMIN) 100 MCG tablet Take 100 mcg by mouth daily.    Marland Kitchen zolpidem (AMBIEN) 5 MG tablet Take 2.5-5 mg by mouth at bedtime as needed for  sleep.      No current facility-administered medications for this visit.   Facility-Administered Medications Ordered in Other Visits  Medication Dose Route Frequency Provider Last Rate Last Admin  . 0.9 %  sodium chloride infusion    Continuous PRN Lowella Dell, CRNA   New Bag at 07/19/13 [id number]     Past Medical History:  Diagnosis Date  . Aortic stenosis    mild by echo 09/2017  . BCC (basal cell carcinoma of skin) 05/19/2001   Pigmented BCC Left Outer mouth  . BCC (basal cell carcinoma of skin) 06/22/2008   BCC Upper Left Back  . BCC (basal cell carcinoma) 11/04/2012   BCC Superficial Mid Forehead   . BCC (basal cell carcinoma) 12/02/2013   BCC Mid Forehead  . Coronary disease    2 Vessel,inferior wall MI june 2003, PTCA and Stent RCA June 2003, PTCA stent, Left Circumflex OM 02 Mar 2003, Dr Radford Pax  . Dilated aortic root ([place])    22mm by echo 09/2016  . DJD (degenerative joint disease) of knee    LEFT  . Dyslipidemia   . GERD (gastroesophageal reflux disease)   . History of rotator cuff syndrome    (Right) with biceps tendinitis  . Hypertension   . Nodular basal cell carcinoma (BCC) 10/13/2013  Nod BCC Mid Forehead  . Nodular basal cell carcinoma (BCC) 11/14/2015   BCC Nod. Mid Forehead  . Nodular basal cell carcinoma (BCC) 07/16/2016   Left Forehead Med  . Nodular basal cell carcinoma (BCC) 07/16/2016   Left Forehead Lat  . Obesity   . OSA (obstructive sleep apnea)    presented with HA and drowsiness,mild sleep apnea with oxygen desats to 78%  . Persistent atrial fibrillation ([place])   . Prostate cancer ([place])    /BPH, radioactive seed 2007, Nesi   . SCC (squamous cell carcinoma) 06/22/2008   SCC In Situ Left Sideburn  . SCC (squamous cell carcinoma) 06/22/2008   SCC In Situ Left Preauricular  . SCC (squamous cell carcinoma) 06/22/2008   SCC In Situ Below Right Ear  . SCC (squamous cell carcinoma) 11/04/2012   SCC In Situ Lower Right Forearm  . SCC (squamous  cell carcinoma) 02/11/2014   SCC Left Nasal Blub  . SCC (squamous cell carcinoma) 11/14/2015   SCC In Situ Left Side Neck  . Visit for monitoring Tikosyn therapy 08/12/2017    ROS:   All systems reviewed and negative except as noted in the HPI.   Past Surgical History:  Procedure Laterality Date  . CARDIAC CATHETERIZATION    . CARDIOVERSION N/A 08/27/2013   Procedure: CARDIOVERSION;  Surgeon: Sueanne Margarita, MD;  Location: [facility] ENDOSCOPY;  Service: Cardiovascular;  Laterality: N/A;  . CARDIOVERSION N/A 03/15/2016   Procedure: CARDIOVERSION;  Surgeon: Dorothy Spark, MD;  Location: [place];  Service: Cardiovascular;  Laterality: N/A;  . CARDIOVERSION N/A 10/30/2016   Procedure: CARDIOVERSION;  Surgeon: Jerline Pain, MD;  Location: [facility] ENDOSCOPY;  Service: Cardiovascular;  Laterality: N/A;  . CORONARY ANGIOPLASTY    . JOINT REPLACEMENT     left knee replacement 2009  . TEE WITHOUT CARDIOVERSION N/A 10/30/2016   Procedure: TRANSESOPHAGEAL ECHOCARDIOGRAM (TEE);  Surgeon: Jerline Pain, MD;  Location[facility] ENDOSCOPY;  Service: Cardiovascular;  Laterality: N/A;     Family History  Problem Relation Age of Onset  . Diabetes Mother   . Peripheral vascular disease Father   . Heart attack Father   . Coronary artery disease Brother   . Liver cancer Brother   . Coronary artery disease Brother   . Heart attack Brother   . Cancer Brother   . Sudden death Brother   . Fainting Brother      Social History   Socioeconomic History  . Marital status: Married    Spouse name: Not on file  . Number of children: Not on file  . Years of education: Not on file  . Highest education level: Not on file  Occupational History  . Not on file  Tobacco Use  . Smoking status: Former Smoker    Quit date: 01/28/1958    Years since quitting: 61.8  . Smokeless tobacco: Never Used  Vaping Use  . Vaping Use: Never used  Substance and Sexual Activity  . Alcohol use: Yes    Comment: "a few a week"   . Drug use: No  . Sexual activity: Not on file  Other Topics Concern  . Not on file  Social History Narrative  . Not on file   Social Determinants of Health   Financial Resource Strain:   . Difficulty of Paying Living Expenses: Not on file  Food Insecurity:   . Worried About Charity fundraiser in the Last Year: Not on file  . Ran Out of Food in  the Last Year: Not on file  Transportation Needs:   . Lack of Transportation (Medical): Not on file  . Lack of Transportation (Non-Medical): Not on file  Physical Activity:   . Days of Exercise per Week: Not on file  . Minutes of Exercise per Session: Not on file  Stress:   . Feeling of Stress : Not on file  Social Connections:   . Frequency of Communication with Friends and Family: Not on file  . Frequency of Social Gatherings with Friends and Family: Not on file  . Attends Religious Services: Not on file  . Active Member of Clubs or Organizations: Not on file  . Attends Archivist Meetings: Not on file  . Marital Status: Not on file  Intimate Partner Violence:   . Fear of Current or Ex-Partner: Not on file  . Emotionally Abused: Not on file  . Physically Abused: Not on file  . Sexually Abused: Not on file     BP 122/72   Pulse 63   Ht 6\' 2"  (1.88 m)   Wt 240 lb 9.6 oz (109.1 kg)   SpO2 95%   BMI 30.89 kg/m   Physical Exam:  Well appearing NAD HEENT: Unremarkable Neck:  No JVD, no thyromegally Lymphatics:  No adenopathy Back:  No CVA tenderness Lungs:  Clear with no wheezes HEART:  Regular rate rhythm, no murmurs, no rubs, no clicks Abd:  soft, positive bowel sounds, no organomegally, no rebound, no guarding Ext:  2 plus pulses, no edema, no cyanosis, no clubbing Skin:  No rashes no nodules Neuro:  CN II through XII intact, motor grossly intact  EKG - nsr  Assess/Plan: 1. PAF - he is mostly maintaining NSR. I encourage him to avoid ETOH in excess.  2. Dyslipidemia- he will continue low dose  crestor. 3. Sinus node dysfunction - he is asymptomatic. His HR is ok today.  Carleene Overlie Suezette Lafave,MD

## 2019-12-01 ENCOUNTER — Other Ambulatory Visit: Payer: Self-pay

## 2019-12-01 ENCOUNTER — Ambulatory Visit: Payer: PPO | Admitting: Dermatology

## 2019-12-01 DIAGNOSIS — L821 Other seborrheic keratosis: Secondary | ICD-10-CM | POA: Diagnosis not present

## 2019-12-01 DIAGNOSIS — C434 Malignant melanoma of scalp and neck: Secondary | ICD-10-CM

## 2019-12-01 DIAGNOSIS — L570 Actinic keratosis: Secondary | ICD-10-CM

## 2019-12-01 DIAGNOSIS — D044 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: Secondary | ICD-10-CM

## 2019-12-01 DIAGNOSIS — Z85828 Personal history of other malignant neoplasm of skin: Secondary | ICD-10-CM

## 2019-12-01 DIAGNOSIS — D485 Neoplasm of uncertain behavior of skin: Secondary | ICD-10-CM | POA: Diagnosis not present

## 2019-12-01 DIAGNOSIS — Z1283 Encounter for screening for malignant neoplasm of skin: Secondary | ICD-10-CM | POA: Diagnosis not present

## 2019-12-01 MED ORDER — TRIAMCINOLONE ACETONIDE 0.1 % EX CREA: 1.0000 "application " | TOPICAL_CREAM | Freq: Every day | CUTANEOUS | 5 refills | Status: DC | PRN

## 2019-12-01 NOTE — Patient Instructions (Signed)

## 2019-12-02 ENCOUNTER — Encounter: Payer: Self-pay | Admitting: Dermatology

## 2019-12-02 NOTE — Progress Notes (Signed)
Follow-Up Visit   Subjective  Randall Chen is a 86 y.o. male who presents for the following: Annual Exam (No concerns. needs triamcinolone refill. ).  Several new crusts on scalp Location:  Duration:  Quality:  Associated Signs/Symptoms: Modifying Factors:  Severity:  Timing: Context: History of multiple nonmelanoma skin cancers  Objective  Well appearing patient in no apparent distress; mood and affect are within normal limits.  A full examination was performed including scalp, head, eyes, ears, nose, lips, neck, chest, axillae, abdomen, back, buttocks, bilateral upper extremities, bilateral lower extremities, hands, feet, fingers, toes, fingernails, and toenails. All findings within normal limits unless otherwise noted below.   Assessment & Plan    Screening for malignant neoplasm of skin Mid Back  Yearly skin exams.  Seborrheic keratosis Mid Back  Benign lesions no treatment needed.   Neoplasm of uncertain behavior of skin (3) left post scalp  Skin / nail biopsy Type of biopsy: tangential   Informed consent: discussed and consent obtained   Timeout: patient name, date of birth, surgical site, and procedure verified   Procedure prep:  Patient was prepped and draped in usual sterile fashion (Non sterile) Prep type:  Chlorhexidine Anesthesia: the lesion was anesthetized in a standard fashion   Anesthetic:  1% lidocaine w/ epinephrine 1-100,000 local infiltration Instrument used: flexible razor blade   Outcome: patient tolerated procedure well   Post-procedure details: wound care instructions given    Specimen 1 - Surgical pathology Differential Diagnosis: scc vs bcc Check Margins: No  Mid Frontal Scalp  Skin / nail biopsy Type of biopsy: tangential   Informed consent: discussed and consent obtained   Timeout: patient name, date of birth, surgical site, and procedure verified   Procedure prep:  Patient was prepped and draped in usual sterile fashion  (Non sterile) Prep type:  Chlorhexidine Anesthesia: the lesion was anesthetized in a standard fashion   Anesthetic:  1% lidocaine w/ epinephrine 1-100,000 local infiltration Instrument used: flexible razor blade   Outcome: patient tolerated procedure well   Post-procedure details: wound care instructions given    Specimen 2 - Surgical pathology Differential Diagnosis: scc vs bcc Check Margins: No  top mid scalp  Skin / nail biopsy Type of biopsy: tangential   Informed consent: discussed and consent obtained   Timeout: patient name, date of birth, surgical site, and procedure verified   Procedure prep:  Patient was prepped and draped in usual sterile fashion (Non sterile) Prep type:  Chlorhexidine Anesthesia: the lesion was anesthetized in a standard fashion   Anesthetic:  1% lidocaine w/ epinephrine 1-100,000 local infiltration Instrument used: flexible razor blade   Outcome: patient tolerated procedure well   Post-procedure details: wound care instructions given    Specimen 3 - Surgical pathology Differential Diagnosis: scc vs bcc Check Margins: No  AK (actinic keratosis) (17) Left Ear; Right Hand - Posterior (2); Right Forearm - Anterior (2); Mid Parietal Scalp (5); Mid Forehead; Left Buccal Cheek  (4); Right Buccal Cheek  (2)  Destruction of lesion - Left Buccal Cheek , Left Ear, Mid Forehead, Mid Parietal Scalp, Right Buccal Cheek , Right Forearm - Anterior, Right Hand - Posterior Complexity: simple   Destruction method: cryotherapy   Informed consent: discussed and consent obtained   Timeout:  patient name, date of birth, surgical site, and procedure verified Lesion destroyed using liquid nitrogen: Yes   Cryotherapy cycles:  5 Outcome: patient tolerated procedure well with no complications     Skin cancer screening  performed today.    I, Lavonna Monarch, MD, have reviewed all documentation for this visit.  The documentation on 12/02/19 for the exam, diagnosis,  procedures, and orders are all accurate and complete.

## 2019-12-08 ENCOUNTER — Telehealth: Payer: Self-pay | Admitting: Dermatology

## 2019-12-12 ENCOUNTER — Other Ambulatory Visit: Payer: Self-pay | Admitting: Internal Medicine

## 2019-12-17 NOTE — Telephone Encounter (Signed)
Phone call from patient wanting to know if we've heard anything from [facility] regarding his Melanoma.  I informed patient that [facility] did send [place] a fax on 12/14/2019 letting us know that they did receive our information regarding the patient's Melanoma.  Patient wanted to know if we would contact him once we hear back from [facility]?  I informed patient that once we hear back from [place] we will give him a call.

## 2019-12-20 ENCOUNTER — Other Ambulatory Visit: Payer: Self-pay

## 2019-12-20 MED ORDER — ROSUVASTATIN CALCIUM 5 MG PO TABS
5.0000 mg | ORAL_TABLET | ORAL | 2 refills | Status: DC
Start: 2019-12-20 — End: 2020-07-10

## 2019-12-21 ENCOUNTER — Telehealth: Payer: Self-pay | Admitting: Dermatology

## 2019-12-21 NOTE — Telephone Encounter (Signed)
Patient is calling for pathology results from his last visit with Lavonna Monarch, MD.  Patient states that he will be in a meeting and would like for [place] to speak with his wife, Jeani Hawking, at [phone].

## 2019-12-21 NOTE — Telephone Encounter (Signed)
Fax received from [facility] [place] that they will give [place] a test result in 5 business days.

## 2019-12-21 NOTE — Telephone Encounter (Signed)
Phone call to patient's wife regarding his results.  I was able to speak with patient and he was wanting to know if [facility] had given [place] a result regarding his Melanoma.  I informed patient that we received a fax from [facility] and it stated that they now have the tissue from [facility] and to allow them 5 business days to give [place] a result.  Patient aware.

## 2019-12-27 NOTE — Telephone Encounter (Signed)
-----   Message from Randall Monarch, MD sent at 12/09/2019  6:45 AM EST ----- I called patient and discussed his biopsy results with him yesterday.  We will await the results of the [facility] bioscience test before deciding on definitive therapy.  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

## 2019-12-27 NOTE — Telephone Encounter (Signed)
Phone call to patient to inform him that Dr. Denna Haggard wants [place] to get him schedule with [facility] to have his Melanoma treated.  Patient aware.

## 2019-12-27 NOTE — Telephone Encounter (Signed)
Fax received from [facility] stating their unable to perform the test due to not having adequate tumor density.  Per Dr. Denna Haggard contact patient and get him scheduled with [facility] for excision.

## 2019-12-31 DIAGNOSIS — E7800 Pure hypercholesterolemia, unspecified: Secondary | ICD-10-CM | POA: Diagnosis not present

## 2019-12-31 DIAGNOSIS — Z8546 Personal history of malignant neoplasm of prostate: Secondary | ICD-10-CM | POA: Diagnosis not present

## 2019-12-31 DIAGNOSIS — M159 Polyosteoarthritis, unspecified: Secondary | ICD-10-CM | POA: Diagnosis not present

## 2019-12-31 DIAGNOSIS — I2510 Atherosclerotic heart disease of native coronary artery without angina pectoris: Secondary | ICD-10-CM | POA: Diagnosis not present

## 2019-12-31 DIAGNOSIS — N400 Enlarged prostate without lower urinary tract symptoms: Secondary | ICD-10-CM | POA: Diagnosis not present

## 2019-12-31 DIAGNOSIS — G4700 Insomnia, unspecified: Secondary | ICD-10-CM | POA: Diagnosis not present

## 2019-12-31 DIAGNOSIS — I10 Essential (primary) hypertension: Secondary | ICD-10-CM | POA: Diagnosis not present

## 2019-12-31 DIAGNOSIS — K219 Gastro-esophageal reflux disease without esophagitis: Secondary | ICD-10-CM | POA: Diagnosis not present

## 2020-01-11 DIAGNOSIS — L905 Scar conditions and fibrosis of skin: Secondary | ICD-10-CM | POA: Diagnosis not present

## 2020-01-11 DIAGNOSIS — C434 Malignant melanoma of scalp and neck: Secondary | ICD-10-CM | POA: Diagnosis not present

## 2020-01-11 DIAGNOSIS — L989 Disorder of the skin and subcutaneous tissue, unspecified: Secondary | ICD-10-CM | POA: Diagnosis not present

## 2020-01-11 DIAGNOSIS — L821 Other seborrheic keratosis: Secondary | ICD-10-CM | POA: Diagnosis not present

## 2020-02-17 DIAGNOSIS — Z20822 Contact with and (suspected) exposure to covid-19: Secondary | ICD-10-CM | POA: Diagnosis not present

## 2020-03-20 ENCOUNTER — Other Ambulatory Visit: Payer: Self-pay | Admitting: Cardiology

## 2020-03-20 NOTE — Telephone Encounter (Signed)
Pt last saw Dr Lovena Le 11/09/19, last labs 11/05/19 Creat 1.28, age 87, weight 109.1kg, based on specified criteria pt is on appropriate dosage of Eliquis 5mg  BID.  Will refill rx.

## 2020-03-28 ENCOUNTER — Other Ambulatory Visit: Payer: Self-pay | Admitting: *Deleted

## 2020-03-28 DIAGNOSIS — I712 Thoracic aortic aneurysm, without rupture, unspecified: Secondary | ICD-10-CM

## 2020-03-30 ENCOUNTER — Other Ambulatory Visit: Payer: PPO | Admitting: *Deleted

## 2020-03-30 ENCOUNTER — Other Ambulatory Visit: Payer: Self-pay

## 2020-03-30 DIAGNOSIS — I712 Thoracic aortic aneurysm, without rupture, unspecified: Secondary | ICD-10-CM

## 2020-03-30 DIAGNOSIS — I482 Chronic atrial fibrillation, unspecified: Secondary | ICD-10-CM | POA: Diagnosis not present

## 2020-03-30 DIAGNOSIS — Z8546 Personal history of malignant neoplasm of prostate: Secondary | ICD-10-CM | POA: Diagnosis not present

## 2020-03-30 DIAGNOSIS — I10 Essential (primary) hypertension: Secondary | ICD-10-CM | POA: Diagnosis not present

## 2020-03-30 DIAGNOSIS — N400 Enlarged prostate without lower urinary tract symptoms: Secondary | ICD-10-CM | POA: Diagnosis not present

## 2020-03-30 DIAGNOSIS — E7800 Pure hypercholesterolemia, unspecified: Secondary | ICD-10-CM | POA: Diagnosis not present

## 2020-03-30 DIAGNOSIS — G4700 Insomnia, unspecified: Secondary | ICD-10-CM | POA: Diagnosis not present

## 2020-03-30 DIAGNOSIS — K219 Gastro-esophageal reflux disease without esophagitis: Secondary | ICD-10-CM | POA: Diagnosis not present

## 2020-03-30 DIAGNOSIS — I2510 Atherosclerotic heart disease of native coronary artery without angina pectoris: Secondary | ICD-10-CM | POA: Diagnosis not present

## 2020-03-30 DIAGNOSIS — M159 Polyosteoarthritis, unspecified: Secondary | ICD-10-CM | POA: Diagnosis not present

## 2020-03-30 LAB — BASIC METABOLIC PANEL
BUN/Creatinine Ratio: 21 (ref 10–24)
BUN: 25 mg/dL (ref 8–27)
CO2: 20 mmol/L (ref 20–29)
Calcium: 9.4 mg/dL (ref 8.6–10.2)
Chloride: 106 mmol/L (ref 96–106)
Creatinine, Ser: 1.2 mg/dL (ref 0.76–1.27)
Glucose: 102 mg/dL — ABNORMAL HIGH (ref 65–99)
Potassium: 4.6 mmol/L (ref 3.5–5.2)
Sodium: 142 mmol/L (ref 134–144)
eGFR: 59 mL/min/{1.73_m2} — ABNORMAL LOW (ref >59)

## 2020-04-04 ENCOUNTER — Other Ambulatory Visit: Payer: Self-pay | Admitting: Internal Medicine

## 2020-04-07 DIAGNOSIS — Z1389 Encounter for screening for other disorder: Secondary | ICD-10-CM | POA: Diagnosis not present

## 2020-04-07 DIAGNOSIS — D6869 Other thrombophilia: Secondary | ICD-10-CM | POA: Diagnosis not present

## 2020-04-07 DIAGNOSIS — K219 Gastro-esophageal reflux disease without esophagitis: Secondary | ICD-10-CM | POA: Diagnosis not present

## 2020-04-07 DIAGNOSIS — I10 Essential (primary) hypertension: Secondary | ICD-10-CM | POA: Diagnosis not present

## 2020-04-07 DIAGNOSIS — G4733 Obstructive sleep apnea (adult) (pediatric): Secondary | ICD-10-CM | POA: Diagnosis not present

## 2020-04-07 DIAGNOSIS — Z0000 Encounter for general adult medical examination without abnormal findings: Secondary | ICD-10-CM | POA: Diagnosis not present

## 2020-04-07 DIAGNOSIS — I495 Sick sinus syndrome: Secondary | ICD-10-CM | POA: Diagnosis not present

## 2020-04-07 DIAGNOSIS — N400 Enlarged prostate without lower urinary tract symptoms: Secondary | ICD-10-CM | POA: Diagnosis not present

## 2020-04-07 DIAGNOSIS — I482 Chronic atrial fibrillation, unspecified: Secondary | ICD-10-CM | POA: Diagnosis not present

## 2020-04-07 DIAGNOSIS — I2510 Atherosclerotic heart disease of native coronary artery without angina pectoris: Secondary | ICD-10-CM | POA: Diagnosis not present

## 2020-04-12 ENCOUNTER — Other Ambulatory Visit: Payer: PPO

## 2020-04-12 ENCOUNTER — Ambulatory Visit (INDEPENDENT_AMBULATORY_CARE_PROVIDER_SITE_OTHER)
Admission: RE | Admit: 2020-04-12 | Discharge: 2020-04-12 | Disposition: A | Discharge status: Home / Self Care | Payer: PPO | Source: Ambulatory Visit | Attending: Cardiology | Admitting: Cardiology

## 2020-04-12 ENCOUNTER — Other Ambulatory Visit: Payer: Self-pay

## 2020-04-12 DIAGNOSIS — K449 Diaphragmatic hernia without obstruction or gangrene: Secondary | ICD-10-CM | POA: Diagnosis not present

## 2020-04-12 DIAGNOSIS — R911 Solitary pulmonary nodule: Secondary | ICD-10-CM | POA: Diagnosis not present

## 2020-04-12 DIAGNOSIS — I712 Thoracic aortic aneurysm, without rupture, unspecified: Secondary | ICD-10-CM

## 2020-04-12 MED ORDER — IOHEXOL 350 MG/ML SOLN
100.0000 mL | Freq: Once | INTRAVENOUS | Status: AC | PRN
  Administered 2020-04-12: 100 mL via INTRAVENOUS

## 2020-04-18 ENCOUNTER — Encounter: Payer: Self-pay | Admitting: Dermatology

## 2020-04-18 ENCOUNTER — Other Ambulatory Visit: Payer: Self-pay

## 2020-04-18 ENCOUNTER — Ambulatory Visit: Payer: PPO | Admitting: Dermatology

## 2020-04-18 DIAGNOSIS — Z1283 Encounter for screening for malignant neoplasm of skin: Secondary | ICD-10-CM | POA: Diagnosis not present

## 2020-04-18 DIAGNOSIS — L821 Other seborrheic keratosis: Secondary | ICD-10-CM

## 2020-04-18 DIAGNOSIS — D485 Neoplasm of uncertain behavior of skin: Secondary | ICD-10-CM

## 2020-04-18 DIAGNOSIS — C4441 Basal cell carcinoma of skin of scalp and neck: Secondary | ICD-10-CM

## 2020-04-18 DIAGNOSIS — D1801 Hemangioma of skin and subcutaneous tissue: Secondary | ICD-10-CM | POA: Diagnosis not present

## 2020-04-18 DIAGNOSIS — Z85820 Personal history of malignant melanoma of skin: Secondary | ICD-10-CM

## 2020-04-18 NOTE — Patient Instructions (Signed)

## 2020-04-26 ENCOUNTER — Telehealth: Payer: Self-pay

## 2020-04-26 NOTE — Telephone Encounter (Signed)
-----   Message from Lavonna Monarch, MD sent at 04/26/2020  4:42 AM EDT ----- Schedule surgery with Dr. Darene Lamer

## 2020-04-26 NOTE — Telephone Encounter (Signed)
Path to patient surgery made  

## 2020-04-27 DIAGNOSIS — Z5181 Encounter for therapeutic drug level monitoring: Secondary | ICD-10-CM | POA: Diagnosis not present

## 2020-04-27 DIAGNOSIS — I10 Essential (primary) hypertension: Secondary | ICD-10-CM | POA: Diagnosis not present

## 2020-04-29 ENCOUNTER — Encounter: Payer: Self-pay | Admitting: Dermatology

## 2020-04-29 NOTE — Progress Notes (Signed)
   Follow-Up Visit   Subjective  Randall Chen is a 87 y.o. male who presents for the following: Follow-up ([facility]).  Skin check Location:  Duration:  Quality:  Associated Signs/Symptoms: Modifying Factors:  Severity:  Timing: Context: History of melanoma  Objective  Well appearing patient in no apparent distress; mood and affect are within normal limits. Objective  waist up skin exam: Waist up skin check.  No atypical pigmented lesions, no recurrence of nonmole skin cancer.  Objective  Mid Back, Neck - Anterior, Right Superior Helix: Multiple brown textured flattopped papules  Objective  Posterior Mid Neck: Elongated 1.2 cm waxy pink papule, rule out BCC       Objective  Left Abdomen (side) - Upper, Left Upper Arm - Posterior, Mid Back: 1 to 2 mm smooth red papules  Objective  Left Posterior Scalp: Scar clear negative lymph nodes     All skin waist up examined.   Assessment & Plan    Screening exam for skin cancer waist up skin exam  Yearly skin check  Seborrheic keratosis (3) Neck - Anterior; Mid Back; Right Superior Helix  Benign okay to leave if stable.  Neoplasm of uncertain behavior of skin Posterior Mid Neck  Skin / nail biopsy Type of biopsy: tangential   Informed consent: discussed and consent obtained   Timeout: patient name, date of birth, surgical site, and procedure verified   Procedure prep:  Patient was prepped and draped in usual sterile fashion (Non sterile) Prep type:  Chlorhexidine Anesthesia: the lesion was anesthetized in a standard fashion   Anesthetic:  1% lidocaine w/ epinephrine 1-100,000 local infiltration Instrument used: flexible razor blade   Hemostasis achieved with: ferric subsulfate   Outcome: patient tolerated procedure well   Post-procedure details: sterile dressing applied and wound care instructions given   Dressing type: bandage and petrolatum    Specimen 1 - Surgical pathology Differential Diagnosis:  bcc vs scc  Check Margins: No  Cherry angioma (3) Left Upper Arm - Posterior; Left Abdomen (side) - Upper; Mid Back  No intervention required.  Personal history of malignant melanoma of skin Left Posterior Scalp  Annual skin examination.      I, Lavonna Monarch, MD, have reviewed all documentation for this visit.  The documentation on 04/29/20 for the exam, diagnosis, procedures, and orders are all accurate and complete.

## 2020-06-08 ENCOUNTER — Ambulatory Visit (INDEPENDENT_AMBULATORY_CARE_PROVIDER_SITE_OTHER): Payer: PPO | Admitting: Dermatology

## 2020-06-08 ENCOUNTER — Other Ambulatory Visit: Payer: Self-pay

## 2020-06-08 VITALS — BP 110/54 | HR 48

## 2020-06-08 DIAGNOSIS — C4441 Basal cell carcinoma of skin of scalp and neck: Secondary | ICD-10-CM | POA: Diagnosis not present

## 2020-06-08 NOTE — Patient Instructions (Signed)

## 2020-06-09 DIAGNOSIS — H35371 Puckering of macula, right eye: Secondary | ICD-10-CM | POA: Diagnosis not present

## 2020-06-09 DIAGNOSIS — H532 Diplopia: Secondary | ICD-10-CM | POA: Diagnosis not present

## 2020-06-09 DIAGNOSIS — H35363 Drusen (degenerative) of macula, bilateral: Secondary | ICD-10-CM | POA: Diagnosis not present

## 2020-06-09 DIAGNOSIS — H35013 Changes in retinal vascular appearance, bilateral: Secondary | ICD-10-CM | POA: Diagnosis not present

## 2020-06-09 DIAGNOSIS — H04123 Dry eye syndrome of bilateral lacrimal glands: Secondary | ICD-10-CM | POA: Diagnosis not present

## 2020-06-09 DIAGNOSIS — H35033 Hypertensive retinopathy, bilateral: Secondary | ICD-10-CM | POA: Diagnosis not present

## 2020-06-13 DIAGNOSIS — N400 Enlarged prostate without lower urinary tract symptoms: Secondary | ICD-10-CM | POA: Diagnosis not present

## 2020-06-13 DIAGNOSIS — G4700 Insomnia, unspecified: Secondary | ICD-10-CM | POA: Diagnosis not present

## 2020-06-13 DIAGNOSIS — I2510 Atherosclerotic heart disease of native coronary artery without angina pectoris: Secondary | ICD-10-CM | POA: Diagnosis not present

## 2020-06-13 DIAGNOSIS — K219 Gastro-esophageal reflux disease without esophagitis: Secondary | ICD-10-CM | POA: Diagnosis not present

## 2020-06-13 DIAGNOSIS — I482 Chronic atrial fibrillation, unspecified: Secondary | ICD-10-CM | POA: Diagnosis not present

## 2020-06-13 DIAGNOSIS — M159 Polyosteoarthritis, unspecified: Secondary | ICD-10-CM | POA: Diagnosis not present

## 2020-06-13 DIAGNOSIS — E7800 Pure hypercholesterolemia, unspecified: Secondary | ICD-10-CM | POA: Diagnosis not present

## 2020-06-13 DIAGNOSIS — I10 Essential (primary) hypertension: Secondary | ICD-10-CM | POA: Diagnosis not present

## 2020-06-15 ENCOUNTER — Other Ambulatory Visit: Payer: Self-pay

## 2020-06-15 ENCOUNTER — Ambulatory Visit: Payer: PPO | Admitting: Internal Medicine

## 2020-06-15 ENCOUNTER — Encounter: Payer: Self-pay | Admitting: Internal Medicine

## 2020-06-15 ENCOUNTER — Encounter: Payer: Self-pay | Admitting: Dermatology

## 2020-06-15 VITALS — BP 136/66 | HR 57 | Ht 74.0 in | Wt 249.0 lb

## 2020-06-15 DIAGNOSIS — I480 Paroxysmal atrial fibrillation: Secondary | ICD-10-CM | POA: Diagnosis not present

## 2020-06-15 DIAGNOSIS — I495 Sick sinus syndrome: Secondary | ICD-10-CM

## 2020-06-15 NOTE — Progress Notes (Signed)
HPI Mr. Mears returns today for followup of his atrial fib. He had done well with no symptoms on dofetilide since he cut back on the red wine. He denies medical non-compliance. He remains active though he has gained almost 10 lbs since his last visit. No sob or chest pain or edema. Allergies  Allergen Reactions  . Prednisone Other (See Comments)    anxiety  . Zocor [Simvastatin] Other (See Comments)    Muscle aches  . Yellow Jacket Venom [Bee Venom] Swelling    Swelling and raised bumps at site  . Hydrocodone Rash  . Oxycodone Hcl Rash     Current Outpatient Medications  Medication Sig Dispense Refill  . dofetilide (TIKOSYN) 250 MCG capsule TAKE 1 CAPSULE BY MOUTH TWICE DAILY 180 capsule 3  . ELIQUIS 5 MG TABS tablet TAKE 1 TABLET(5 MG) BY MOUTH TWICE DAILY 60 tablet 5  . ezetimibe (ZETIA) 10 MG tablet TAKE 1 TABLET(10 MG) BY MOUTH DAILY 90 tablet 3  . fluticasone (FLONASE) 50 MCG/ACT nasal spray Place 1 spray into both nostrils daily as needed for allergies or rhinitis.    Marland Kitchen irbesartan (AVAPRO) 300 MG tablet Take 300 mg by mouth daily.    . magnesium oxide (MAG-OX) 400 MG tablet Take 1 tablet (400 mg total) by mouth daily. 90 tablet 1  . metoprolol tartrate (LOPRESSOR) 25 MG tablet Take 0.5 tablets (12.5 mg total) by mouth 2 (two) times daily. 90 tablet 1  . nitroGLYCERIN (NITROSTAT) 0.4 MG SL tablet Place 1 tablet (0.4 mg total) under the tongue every 5 (five) minutes as needed for chest pain. 25 tablet 5  . omeprazole (PRILOSEC) 40 MG capsule Take 40 mg by mouth daily as needed (heartburn).    . rosuvastatin (CRESTOR) 5 MG tablet Take 1 tablet (5 mg total) by mouth 3 (three) times a week. 45 tablet 2  . tamsulosin (FLOMAX) 0.4 MG CAPS capsule Take 0.4 mg by mouth daily.    Marland Kitchen triamcinolone cream (KENALOG) 0.1 % Apply 1 application topically daily as needed. 453 g 5  . vitamin B-12 (CYANOCOBALAMIN) 100 MCG tablet Take 100 mcg by mouth daily.    Marland Kitchen zolpidem (AMBIEN) 5 MG  tablet Take 2.5-5 mg by mouth at bedtime as needed for sleep.      No current facility-administered medications for this visit.   Facility-Administered Medications Ordered in Other Visits  Medication Dose Route Frequency Provider Last Rate Last Admin  . 0.9 %  sodium chloride infusion    Continuous PRN Lowella Dell, CRNA   New Bag at 07/19/13 1937     Past Medical History:  Diagnosis Date  . Aortic stenosis    mild by echo 09/2017  . BCC (basal cell carcinoma of skin) 05/19/2001   Pigmented BCC Left Outer mouth  . BCC (basal cell carcinoma of skin) 06/22/2008   BCC Upper Left Back  . BCC (basal cell carcinoma) 11/04/2012   BCC Superficial Mid Forehead   . BCC (basal cell carcinoma) 12/02/2013   BCC Mid Forehead  . Coronary disease    2 Vessel,inferior wall MI june 2003, PTCA and Stent RCA June 2003, PTCA stent, Left Circumflex OM 02 Mar 2003, Dr Radford Pax  . Dilated aortic root ([place])    15mm by echo 09/2016  . DJD (degenerative joint disease) of knee    LEFT  . Dyslipidemia   . GERD (gastroesophageal reflux disease)   . History of rotator cuff syndrome    (Right)  with biceps tendinitis  . Hypertension   . Melanoma ([place]) 02/26/1934   clark level 2 left post scalp tx with mohs Dr Link Snuffer  . Nodular basal cell carcinoma (BCC) 10/13/2013   Nod BCC Mid Forehead  . Nodular basal cell carcinoma (BCC) 11/14/2015   BCC Nod. Mid Forehead  . Nodular basal cell carcinoma (BCC) 07/16/2016   Left Forehead Med  . Nodular basal cell carcinoma (BCC) 07/16/2016   Left Forehead Lat  . Obesity   . OSA (obstructive sleep apnea)    presented with HA and drowsiness,mild sleep apnea with oxygen desats to 78%  . Persistent atrial fibrillation ([place])   . Prostate cancer ([place])    /BPH, radioactive seed 2007, [facility]   . SCC (squamous cell carcinoma) 06/22/2008   SCC In Situ Left Sideburn  . SCC (squamous cell carcinoma) 06/22/2008   SCC In Situ Left Preauricular  . SCC (squamous cell  carcinoma) 06/22/2008   SCC In Situ Below Right Ear  . SCC (squamous cell carcinoma) 11/04/2012   SCC In Situ Lower Right Forearm  . SCC (squamous cell carcinoma) 02/11/2014   SCC Left Nasal Blub  . SCC (squamous cell carcinoma) 11/14/2015   SCC In Situ Left Side Neck  . Visit for monitoring Tikosyn therapy 08/12/2017    ROS:   All systems reviewed and negative except as noted in the HPI.   Past Surgical History:  Procedure Laterality Date  . CARDIAC CATHETERIZATION    . CARDIOVERSION N/A 08/27/2013   Procedure: CARDIOVERSION;  Surgeon: Sueanne Margarita, MD;  Location: [facility] ENDOSCOPY;  Service: Cardiovascular;  Laterality: N/A;  . CARDIOVERSION N/A 03/15/2016   Procedure: CARDIOVERSION;  Surgeon: Dorothy Spark, MD;  Location: [facility];  Service: Cardiovascular;  Laterality: N/A;  . CARDIOVERSION N/A 10/30/2016   Procedure: CARDIOVERSION;  Surgeon: Jerline Pain, MD;  Location: [facility] ENDOSCOPY;  Service: Cardiovascular;  Laterality: N/A;  . CORONARY ANGIOPLASTY    . JOINT REPLACEMENT     left knee replacement 2009  . TEE WITHOUT CARDIOVERSION N/A 10/30/2016   Procedure: TRANSESOPHAGEAL ECHOCARDIOGRAM (TEE);  Surgeon: Jerline Pain, MD;  Location: [facility] ENDOSCOPY;  Service: Cardiovascular;  Laterality: N/A;     Family History  Problem Relation Age of Onset  . Diabetes Mother   . Peripheral vascular disease Father   . Heart attack Father   . Coronary artery disease Brother   . Liver cancer Brother   . Coronary artery disease Brother   . Heart attack Brother   . Cancer Brother   . Sudden death Brother   . Fainting Brother      Social History   Socioeconomic History  . Marital status: Married    Spouse name: Not on file  . Number of children: Not on file  . Years of education: Not on file  . Highest education level: Not on file  Occupational History  . Not on file  Tobacco Use  . Smoking status: Former Smoker    Quit date: 01/28/1958    Years since quitting: 62.4  .  Smokeless tobacco: Never Used  Vaping Use  . Vaping Use: Never used  Substance and Sexual Activity  . Alcohol use: Yes    Comment: "a few a week"  . Drug use: No  . Sexual activity: Not on file  Other Topics Concern  . Not on file  Social History Narrative  . Not on file   Social Determinants of Health   Financial Resource Strain: Not on  file  Food Insecurity: Not on file  Transportation Needs: Not on file  Physical Activity: Not on file  Stress: Not on file  Social Connections: Not on file  Intimate Partner Violence: Not on file     BP 136/66   Pulse (!) 57   Ht 6\' 2"  (1.88 m)   Wt 249 lb (112.9 kg)   SpO2 96%   BMI 31.97 kg/m   Physical Exam:  Well appearing NAD HEENT: Unremarkable Neck:  No JVD, no thyromegally Lymphatics:  No adenopathy Back:  No CVA tenderness Lungs:  Clear with no wheezes HEART:  Regular rate rhythm, no murmurs, no rubs, no clicks Abd:  soft, positive bowel sounds, no organomegally, no rebound, no guarding Ext:  2 plus pulses, no edema, no cyanosis, no clubbing Skin:  No rashes no nodules Neuro:  CN II through XII intact, motor grossly intact  EKG - nsr  DEVICE  Normal device function.  See PaceArt for details.   Assess/Plan: 1. PAF - he is mostly maintaining NSR. I encourage him to avoid ETOH in excess.  2. Dyslipidemia- he will continue low dose crestor. 3. Sinus node dysfunction - he is asymptomatic. His HR is ok today. 4. Weight gain - his bmi is 32. He is encouraged to watch his weight.   Carleene Overlie Truly Stankiewicz,MD

## 2020-06-15 NOTE — Patient Instructions (Signed)

## 2020-06-21 ENCOUNTER — Encounter: Payer: Self-pay | Admitting: Dermatology

## 2020-06-21 NOTE — Progress Notes (Signed)
   Follow-Up Visit   Subjective  Randall Chen is a 87 y.o. male who presents for the following: Procedure (Here for treatment of BCC x1 posterior mid neck).  BCC Location: Posterior neck Duration:  Quality:  Associated Signs/Symptoms: Modifying Factors:  Severity:  Timing: Context: For treatment  Objective  Well appearing patient in no apparent distress; mood and affect are within normal limits. Objective  Posterior Mid Neck: Lesion identified by Dr.Uchenna Seufert and nurse in room.      A focused examination was performed including Head, neck, upper torso.. Relevant physical exam findings are noted in the Assessment and Plan.   Assessment & Plan    Basal cell carcinoma (BCC) of skin of neck Posterior Mid Neck  Destruction of lesion Complexity: simple   Destruction method: electrodesiccation and curettage   Informed consent: discussed and consent obtained   Timeout:  patient name, date of birth, surgical site, and procedure verified Anesthesia: the lesion was anesthetized in a standard fashion   Anesthetic:  1% lidocaine w/ epinephrine 1-100,000 local infiltration Curettage performed in three different directions: Yes   Electrodesiccation performed over the curetted area: Yes   Curettage cycles:  3 Lesion length (cm):  3 Lesion width (cm):  3 Margin per side (cm):  0 Final wound size (cm):  3 Hemostasis achieved with:  ferric subsulfate and electrodesiccation Outcome: patient tolerated procedure well with no complications   Post-procedure details: sterile dressing applied and wound care instructions given   Dressing type: bandage and petrolatum   Additional details:  Wound inoculated with 5% fluorouracil solution      I, Lavonna Monarch, MD, have reviewed all documentation for this visit.  The documentation on 06/21/20 for the exam, diagnosis, procedures, and orders are all accurate and complete.

## 2020-07-04 DIAGNOSIS — G4700 Insomnia, unspecified: Secondary | ICD-10-CM | POA: Diagnosis not present

## 2020-07-10 ENCOUNTER — Other Ambulatory Visit: Payer: Self-pay | Admitting: Cardiology

## 2020-09-11 DIAGNOSIS — I2510 Atherosclerotic heart disease of native coronary artery without angina pectoris: Secondary | ICD-10-CM | POA: Diagnosis not present

## 2020-09-11 DIAGNOSIS — K219 Gastro-esophageal reflux disease without esophagitis: Secondary | ICD-10-CM | POA: Diagnosis not present

## 2020-09-11 DIAGNOSIS — Z8546 Personal history of malignant neoplasm of prostate: Secondary | ICD-10-CM | POA: Diagnosis not present

## 2020-09-11 DIAGNOSIS — M159 Polyosteoarthritis, unspecified: Secondary | ICD-10-CM | POA: Diagnosis not present

## 2020-09-11 DIAGNOSIS — I482 Chronic atrial fibrillation, unspecified: Secondary | ICD-10-CM | POA: Diagnosis not present

## 2020-09-11 DIAGNOSIS — N400 Enlarged prostate without lower urinary tract symptoms: Secondary | ICD-10-CM | POA: Diagnosis not present

## 2020-09-11 DIAGNOSIS — E7800 Pure hypercholesterolemia, unspecified: Secondary | ICD-10-CM | POA: Diagnosis not present

## 2020-09-11 DIAGNOSIS — I10 Essential (primary) hypertension: Secondary | ICD-10-CM | POA: Diagnosis not present

## 2020-09-11 DIAGNOSIS — G4700 Insomnia, unspecified: Secondary | ICD-10-CM | POA: Diagnosis not present

## 2020-09-25 ENCOUNTER — Other Ambulatory Visit: Payer: Self-pay | Admitting: Cardiology

## 2020-09-25 ENCOUNTER — Other Ambulatory Visit: Payer: Self-pay | Admitting: Internal Medicine

## 2020-09-25 MED ORDER — ROSUVASTATIN CALCIUM 5 MG PO TABS: ORAL_TABLET | ORAL | 2 refills | Status: DC

## 2020-09-25 NOTE — Telephone Encounter (Signed)
Eliquis 5 mg refill request received. Patient is 87 years old, weight-112.9 kg, Crea- 1.2 on 03/30/20 via epic, Diagnosis-PAF, and last seen by Dr. Lovena Le on 06/05/20. Dose is appropriate based on dosing criteria. Will send in refill to requested pharmacy.

## 2020-09-29 ENCOUNTER — Ambulatory Visit (HOSPITAL_COMMUNITY): Payer: PPO | Attending: Cardiology

## 2020-09-29 ENCOUNTER — Other Ambulatory Visit: Payer: Self-pay

## 2020-09-29 DIAGNOSIS — I712 Thoracic aortic aneurysm, without rupture, unspecified: Secondary | ICD-10-CM

## 2020-09-29 LAB — ECHOCARDIOGRAM COMPLETE
AR max vel: 1.78 cm2
AV Area VTI: 1.97 cm2
AV Area mean vel: 1.74 cm2
AV Mean grad: 11 mmHg
AV Peak grad: 20.4 mmHg
Ao pk vel: 2.26 m/s
Area-P 1/2: 2.91 cm2
S' Lateral: 3.4 cm

## 2020-09-29 MED ORDER — PERFLUTREN LIPID MICROSPHERE
1.0000 mL | INTRAVENOUS | Status: AC | PRN
  Administered 2020-09-29: 3 mL via INTRAVENOUS

## 2020-10-03 DIAGNOSIS — G4733 Obstructive sleep apnea (adult) (pediatric): Secondary | ICD-10-CM | POA: Diagnosis not present

## 2020-10-04 ENCOUNTER — Ambulatory Visit: Payer: PPO | Admitting: Dermatology

## 2020-10-04 ENCOUNTER — Other Ambulatory Visit: Payer: Self-pay

## 2020-10-04 ENCOUNTER — Encounter: Payer: Self-pay | Admitting: Dermatology

## 2020-10-04 DIAGNOSIS — Z85828 Personal history of other malignant neoplasm of skin: Secondary | ICD-10-CM

## 2020-10-04 DIAGNOSIS — D485 Neoplasm of uncertain behavior of skin: Secondary | ICD-10-CM

## 2020-10-04 DIAGNOSIS — C44329 Squamous cell carcinoma of skin of other parts of face: Secondary | ICD-10-CM | POA: Diagnosis not present

## 2020-10-04 NOTE — Patient Instructions (Signed)

## 2020-10-05 DIAGNOSIS — G4733 Obstructive sleep apnea (adult) (pediatric): Secondary | ICD-10-CM | POA: Diagnosis not present

## 2020-10-05 DIAGNOSIS — I482 Chronic atrial fibrillation, unspecified: Secondary | ICD-10-CM | POA: Diagnosis not present

## 2020-10-05 DIAGNOSIS — D6869 Other thrombophilia: Secondary | ICD-10-CM | POA: Diagnosis not present

## 2020-10-05 DIAGNOSIS — Z23 Encounter for immunization: Secondary | ICD-10-CM | POA: Diagnosis not present

## 2020-10-05 DIAGNOSIS — I10 Essential (primary) hypertension: Secondary | ICD-10-CM | POA: Diagnosis not present

## 2020-10-05 DIAGNOSIS — G4700 Insomnia, unspecified: Secondary | ICD-10-CM | POA: Diagnosis not present

## 2020-10-08 ENCOUNTER — Encounter: Payer: Self-pay | Admitting: Dermatology

## 2020-10-08 NOTE — Progress Notes (Signed)
   Follow-Up Visit   Subjective  Randall Chen is a 87 y.o. male who presents for the following: Follow-up (Bcc healed fine and new lesion left jawline KA 4 -6 weeks ).  Recheck surgical site, new growth left jawline Location:  Duration:  Quality:  Associated Signs/Symptoms: Modifying Factors:  Severity:  Timing: Context:   Objective  Well appearing patient in no apparent distress; mood and affect are within normal limits. Left Posterior Mandible 1 cm waxy pink nodule, probable SCCA       Mid Occipital Scalp Smooth scar, no sign residual BCC    A focused examination was performed including head and neck. Relevant physical exam findings are noted in the Assessment and Plan.   Assessment & Plan    Neoplasm of uncertain behavior of skin Left Posterior Mandible  Skin / nail biopsy Type of biopsy: tangential   Informed consent: discussed and consent obtained   Timeout: patient name, date of birth, surgical site, and procedure verified   Procedure prep:  Patient was prepped and draped in usual sterile fashion (Non sterile) Prep type:  Chlorhexidine Anesthesia: the lesion was anesthetized in a standard fashion   Anesthetic:  1% lidocaine w/ epinephrine 1-100,000 local infiltration Instrument used: flexible razor blade   Outcome: patient tolerated procedure well   Post-procedure details: wound care instructions given    Specimen 1 - Surgical pathology Differential Diagnosis: R/O BCC vs SCC  Check Margins: No  Personal history of skin cancer Mid Occipital Scalp  Recheck as needed change      I, Lavonna Monarch, MD, have reviewed all documentation for this visit.  The documentation on 10/08/20 for the exam, diagnosis, procedures, and orders are all accurate and complete.

## 2020-10-11 ENCOUNTER — Telehealth: Payer: Self-pay | Admitting: *Deleted

## 2020-10-11 NOTE — Telephone Encounter (Signed)
-----   Message from Lavonna Monarch, MD sent at 10/11/2020  5:35 AM EDT ----- Schedule surgery with Dr. Darene Lamer

## 2020-10-11 NOTE — Telephone Encounter (Signed)
Pathology results to patient- surgery appointment scheduled.  

## 2020-10-26 DIAGNOSIS — M25551 Pain in right hip: Secondary | ICD-10-CM | POA: Diagnosis not present

## 2020-10-26 DIAGNOSIS — M25552 Pain in left hip: Secondary | ICD-10-CM | POA: Diagnosis not present

## 2020-10-26 DIAGNOSIS — M48061 Spinal stenosis, lumbar region without neurogenic claudication: Secondary | ICD-10-CM | POA: Diagnosis not present

## 2020-10-31 ENCOUNTER — Ambulatory Visit: Payer: PPO | Admitting: Dermatology

## 2020-11-22 ENCOUNTER — Other Ambulatory Visit: Payer: Self-pay | Admitting: *Deleted

## 2020-11-22 MED ORDER — APIXABAN 5 MG PO TABS: ORAL_TABLET | ORAL | 0 refills | Status: DC

## 2020-11-22 NOTE — Telephone Encounter (Signed)
Eliquis 5mg  doseage is correct & refill request received per pt message if no samples are available. Pt is requesting a 2 month supply per message.  [place], [place] Triage (supporting Sueanne Margarita, MD) 52 minutes ago (10:14 AM)  Do you have any Eliquis 5mg  available?  I will need a refill next week, and am in the donut hole.  So if you have any samples, that would be helpful.  Otherwise a need a scrip for two months supply.  Thank you.    Patient is 87 years old, weight-112.9kg, Crea-1.20 on 03/30/2020, Diagnosis-Afib, and last seen by Dr. Lovena Le on 06/15/2020. Dose is appropriate based on dosing criteria. Will send in refill to requested pharmacy.

## 2020-11-24 DIAGNOSIS — M545 Low back pain, unspecified: Secondary | ICD-10-CM | POA: Diagnosis not present

## 2020-11-30 ENCOUNTER — Ambulatory Visit (INDEPENDENT_AMBULATORY_CARE_PROVIDER_SITE_OTHER): Payer: PPO | Admitting: Dermatology

## 2020-11-30 ENCOUNTER — Other Ambulatory Visit: Payer: Self-pay

## 2020-11-30 ENCOUNTER — Encounter: Payer: Self-pay | Admitting: Dermatology

## 2020-11-30 DIAGNOSIS — L570 Actinic keratosis: Secondary | ICD-10-CM | POA: Diagnosis not present

## 2020-11-30 DIAGNOSIS — C44329 Squamous cell carcinoma of skin of other parts of face: Secondary | ICD-10-CM

## 2020-11-30 DIAGNOSIS — C4492 Squamous cell carcinoma of skin, unspecified: Secondary | ICD-10-CM

## 2020-12-01 DIAGNOSIS — M545 Low back pain, unspecified: Secondary | ICD-10-CM | POA: Diagnosis not present

## 2020-12-05 ENCOUNTER — Other Ambulatory Visit: Payer: Self-pay | Admitting: Internal Medicine

## 2020-12-05 DIAGNOSIS — I482 Chronic atrial fibrillation, unspecified: Secondary | ICD-10-CM | POA: Diagnosis not present

## 2020-12-05 DIAGNOSIS — I2510 Atherosclerotic heart disease of native coronary artery without angina pectoris: Secondary | ICD-10-CM | POA: Diagnosis not present

## 2020-12-05 DIAGNOSIS — M159 Polyosteoarthritis, unspecified: Secondary | ICD-10-CM | POA: Diagnosis not present

## 2020-12-05 DIAGNOSIS — K219 Gastro-esophageal reflux disease without esophagitis: Secondary | ICD-10-CM | POA: Diagnosis not present

## 2020-12-05 DIAGNOSIS — I10 Essential (primary) hypertension: Secondary | ICD-10-CM | POA: Diagnosis not present

## 2020-12-05 DIAGNOSIS — G4700 Insomnia, unspecified: Secondary | ICD-10-CM | POA: Diagnosis not present

## 2020-12-05 DIAGNOSIS — N400 Enlarged prostate without lower urinary tract symptoms: Secondary | ICD-10-CM | POA: Diagnosis not present

## 2020-12-05 DIAGNOSIS — E7800 Pure hypercholesterolemia, unspecified: Secondary | ICD-10-CM | POA: Diagnosis not present

## 2020-12-13 ENCOUNTER — Ambulatory Visit: Payer: PPO | Admitting: Cardiology

## 2020-12-20 ENCOUNTER — Encounter: Payer: Self-pay | Admitting: Dermatology

## 2020-12-20 NOTE — Progress Notes (Signed)
   Follow-Up Visit   Subjective  Randall Chen is a 87 y.o. male who presents for the following: Procedure (Patient here today for treatment of SCC x 1 on left posterior mandible ).  Biopsy-proven SCCA left jawline which patient feels is clear, new crust on scalp Location:  Duration:  Quality:  Associated Signs/Symptoms: Modifying Factors:  Severity:  Timing: Context:   Objective  Well appearing patient in no apparent distress; mood and affect are within normal limits. Left Posterior Mandible Lesion identified by Dr.Amahia Madonia and nurse in room.    Mid Parietal Scalp Horn like 4 mm pink crust    A focused examination was performed including head and neck. Relevant physical exam findings are noted in the Assessment and Plan.   Assessment & Plan    Squamous cell carcinoma of skin Left Posterior Mandible  No treatment today clear.  Will return if there is any sign of recurrence  AK (actinic keratosis) Mid Parietal Scalp  Destruction of lesion - Mid Parietal Scalp Complexity: simple   Destruction method: cryotherapy   Informed consent: discussed and consent obtained   Timeout:  patient name, date of birth, surgical site, and procedure verified Lesion destroyed using liquid nitrogen: Yes   Cryotherapy cycles:  3 Outcome: patient tolerated procedure well with no complications   Post-procedure details: wound care instructions given        I, Lavonna Monarch, MD, have reviewed all documentation for this visit.  The documentation on 12/20/20 for the exam, diagnosis, procedures, and orders are all accurate and complete.

## 2021-01-04 ENCOUNTER — Ambulatory Visit: Payer: PPO | Admitting: Cardiology

## 2021-01-10 DIAGNOSIS — M545 Low back pain, unspecified: Secondary | ICD-10-CM | POA: Diagnosis not present

## 2021-01-18 DIAGNOSIS — M25552 Pain in left hip: Secondary | ICD-10-CM | POA: Diagnosis not present

## 2021-02-07 DIAGNOSIS — M25552 Pain in left hip: Secondary | ICD-10-CM | POA: Diagnosis not present

## 2021-02-27 ENCOUNTER — Other Ambulatory Visit: Payer: Self-pay | Admitting: Dermatology

## 2021-03-26 ENCOUNTER — Other Ambulatory Visit: Payer: Self-pay | Admitting: *Deleted

## 2021-03-26 MED ORDER — METOPROLOL TARTRATE 25 MG PO TABS: 12.5000 mg | ORAL_TABLET | Freq: Two times a day (BID) | ORAL | 0 refills | Status: DC

## 2021-03-27 DIAGNOSIS — M5416 Radiculopathy, lumbar region: Secondary | ICD-10-CM | POA: Diagnosis not present

## 2021-03-29 ENCOUNTER — Telehealth: Payer: Self-pay | Admitting: Cardiology

## 2021-03-29 NOTE — Telephone Encounter (Signed)
Left message for patient advising that blood pressures look good and to continue on current medications. Advised to call back with any questions/concerns.  ?

## 2021-03-29 NOTE — Telephone Encounter (Signed)
Pt sent this Via MyChart to the scheduling pool:   ? ? ? ?FYI, here are BP reading recently.Date time BP pulse  ?2/25 7:00 AM 129/64 59  ?2/26 6:30 AM 137/65 51  ?2/26 Noon 128/64 61  ?2/27 8:45 AM 138/71 57  ?2/27 12:30 PM 130/60 58  ?2/27 8:00 PM 106/52 59  ?2/28 5:45 AM 137/63 58  ?2/28 11:45 AM 122/65 57Dr.Willoughby ?2/28 12:15 PM 125/65 58Dr Thedore Mins ?3/1        3:30 PM        137/67 65  ?3/2       10:15 AM 120/62 61  ? ?

## 2021-04-02 ENCOUNTER — Ambulatory Visit: Payer: PPO | Admitting: Cardiology

## 2021-04-09 DIAGNOSIS — G4733 Obstructive sleep apnea (adult) (pediatric): Secondary | ICD-10-CM | POA: Diagnosis not present

## 2021-04-09 DIAGNOSIS — K219 Gastro-esophageal reflux disease without esophagitis: Secondary | ICD-10-CM | POA: Diagnosis not present

## 2021-04-09 DIAGNOSIS — I4819 Other persistent atrial fibrillation: Secondary | ICD-10-CM | POA: Diagnosis not present

## 2021-04-09 DIAGNOSIS — Z0000 Encounter for general adult medical examination without abnormal findings: Secondary | ICD-10-CM | POA: Diagnosis not present

## 2021-04-09 DIAGNOSIS — I10 Essential (primary) hypertension: Secondary | ICD-10-CM | POA: Diagnosis not present

## 2021-04-09 DIAGNOSIS — I2510 Atherosclerotic heart disease of native coronary artery without angina pectoris: Secondary | ICD-10-CM | POA: Diagnosis not present

## 2021-04-09 DIAGNOSIS — Z1389 Encounter for screening for other disorder: Secondary | ICD-10-CM | POA: Diagnosis not present

## 2021-04-09 DIAGNOSIS — G4700 Insomnia, unspecified: Secondary | ICD-10-CM | POA: Diagnosis not present

## 2021-04-09 DIAGNOSIS — N400 Enlarged prostate without lower urinary tract symptoms: Secondary | ICD-10-CM | POA: Diagnosis not present

## 2021-04-11 DIAGNOSIS — M5416 Radiculopathy, lumbar region: Secondary | ICD-10-CM | POA: Diagnosis not present

## 2021-04-27 ENCOUNTER — Other Ambulatory Visit: Payer: Self-pay | Admitting: Internal Medicine

## 2021-04-27 DIAGNOSIS — H903 Sensorineural hearing loss, bilateral: Secondary | ICD-10-CM | POA: Diagnosis not present

## 2021-04-27 DIAGNOSIS — I4819 Other persistent atrial fibrillation: Secondary | ICD-10-CM

## 2021-04-27 NOTE — Telephone Encounter (Signed)
Eliquis '5mg'$  refill request received. Patient is 88 years old, weight-112.9kg, Crea-1.46 on 04/09/2021 via [facility] from [place], [place], and last seen by Dr. Lovena Le on 06/15/2020. Dose is appropriate based on dosing criteria. Will send in refill to requested pharmacy.   ?

## 2021-04-30 DIAGNOSIS — M1711 Unilateral primary osteoarthritis, right knee: Secondary | ICD-10-CM | POA: Diagnosis not present

## 2021-05-08 NOTE — Addendum Note (Signed)
Encounter addended by: Annie Paras on: 05/08/2021 12:42 PM  Actions taken: Letter saved

## 2021-05-15 DIAGNOSIS — I10 Essential (primary) hypertension: Secondary | ICD-10-CM | POA: Diagnosis not present

## 2021-05-15 DIAGNOSIS — E7800 Pure hypercholesterolemia, unspecified: Secondary | ICD-10-CM | POA: Diagnosis not present

## 2021-05-15 DIAGNOSIS — I482 Chronic atrial fibrillation, unspecified: Secondary | ICD-10-CM | POA: Diagnosis not present

## 2021-05-15 DIAGNOSIS — G4700 Insomnia, unspecified: Secondary | ICD-10-CM | POA: Diagnosis not present

## 2021-05-29 ENCOUNTER — Encounter: Payer: Self-pay | Admitting: Cardiology

## 2021-05-29 ENCOUNTER — Telehealth (INDEPENDENT_AMBULATORY_CARE_PROVIDER_SITE_OTHER): Payer: PPO | Admitting: Cardiology

## 2021-05-29 VITALS — Ht 74.0 in | Wt 242.0 lb

## 2021-05-29 DIAGNOSIS — E669 Obesity, unspecified: Secondary | ICD-10-CM

## 2021-05-29 DIAGNOSIS — I480 Paroxysmal atrial fibrillation: Secondary | ICD-10-CM

## 2021-05-29 DIAGNOSIS — E7800 Pure hypercholesterolemia, unspecified: Secondary | ICD-10-CM | POA: Diagnosis not present

## 2021-05-29 DIAGNOSIS — I10 Essential (primary) hypertension: Secondary | ICD-10-CM | POA: Diagnosis not present

## 2021-05-29 DIAGNOSIS — G4733 Obstructive sleep apnea (adult) (pediatric): Secondary | ICD-10-CM | POA: Diagnosis not present

## 2021-05-29 DIAGNOSIS — I2510 Atherosclerotic heart disease of native coronary artery without angina pectoris: Secondary | ICD-10-CM

## 2021-05-29 DIAGNOSIS — I350 Nonrheumatic aortic (valve) stenosis: Secondary | ICD-10-CM

## 2021-05-29 MED ORDER — METOPROLOL SUCCINATE ER 25 MG PO TB24
25.0000 mg | ORAL_TABLET | Freq: Every evening | ORAL | 3 refills | Status: DC
Start: 2021-05-29 — End: 2022-05-07

## 2021-05-29 NOTE — Patient Instructions (Signed)
Medication Instructions:  ?Your physician has recommended you make the following change in your medication: ?1) CHANGE irbesartan to 300 mg every morning ?2) STOP taking Lopressor (metoprolol tartrate) ?3) START taking Toprol XL (metoprolol succinate) 25 mg every evening ? ?CHECK YOUR BLOOD PRESSURE TWICE DAILY FOR ONE WEEK AND CALL us WITH YOUR READINGS.  ? ?*If you need a refill on your cardiac medications before your next appointment, please call your pharmacy* ? ?Follow-Up: ?At [facility], you and your health needs are our priority.  As part of our continuing mission to provide you with exceptional heart care, we have created designated Provider Care Teams.  These Care Teams include your primary Cardiologist (physician) and Advanced Practice Providers (APPs -  Physician Assistants and Nurse Practitioners) who all work together to provide you with the care you need, when you need it. ? ?Your next appointment:   ?1 year(s) ? ?The format for your next appointment:   ?In Person ? ?Provider:   ?Fransico Him, MD ? ?

## 2021-05-29 NOTE — Addendum Note (Signed)
Addended by: Antonieta Iba on: 05/29/2021 09:51 AM ? ? Modules accepted: Orders ? ?

## 2021-05-29 NOTE — Progress Notes (Signed)
Virtual Visit via Video Note   This visit type was conducted due to national recommendations for restrictions regarding the COVID-19 Pandemic (e.g. social distancing) in an effort to limit this patient's exposure and mitigate transmission in our community.  Due to his co-morbid illnesses, this patient is at least at moderate risk for complications without adequate follow up.  This format is felt to be most appropriate for this patient at this time.  All issues noted in this document were discussed and addressed.  A limited physical exam was performed with this format.  Please refer to the patient's chart for his consent to telehealth for [facility].   Evaluation Performed:  Follow-up visit  Date:  05/29/2021   ID:  Randall Chen, DOB 22-Aug-1932, MRN [id number]  Patient Location:  Home  Provider location:   [place]  PCP:  Lavone Orn, MD  Sleep Medicine:  Fransico Him, MD Electrophysiologist:  None   Chief Complaint:  OSA, HTN, CAD  History of Present Illness:    Randall Chen is a 88 y.o. male who presents via audio/video conferencing for a telehealth visit today.    Randall Chen is an 88y.o. male with a hx of  OSA on CPAP, HTN, ASCAD s/p inferior MI 2003 with PCI of RCA 6/03 and then PCI of left circ/OM 2/05, dyslipidemia, bicuspid AV with mild AS, mildly dilated aorta and PAF followed by Dr. Lovena Le.  He had a reoccurrence of PAF in Feb 2018 and underwent successful DCCV to NSR.  He was seen in the afib clinic 10/14/2016 with complaints of palpitations for 1 week after a gambling weekend in [place] where he drank more ETOH than usual.  Due to subtherapeutic INR, he underwent TEE/DCCV to NSR on 10/30/2016. He was seen back in afib clinic in Feb 2019 with recurrent afib.  ADD therapy was discussed with only option being Tikosyn.  He has bradycardia so Sotolol was not an option.  He was subsequently loaded on dofetilide.   He is here today for followup and is doing well.  He  denies any chest pain or pressure, SOB, DOE, PND, orthopnea, LE edema, dizziness, palpitations or syncope. He is compliant with his meds and is tolerating meds with no SE.     He is doing well with his CPAP device and thinks that he has gotten used to it.  He tolerates the mask and feels the pressure is adequate.  Since going on CPAP he feels rested in the am and has no significant daytime sleepiness.  He denies any significant mouth or nasal dryness or nasal congestion.  HE does not think that he snores.     Prior CV studies:   The following studies were reviewed today:  PAP compliance download  Past Medical History:  Diagnosis Date   Aortic stenosis    mild by echo 09/2017   BCC (basal cell carcinoma of skin) 05/19/2001   Pigmented BCC Left Outer mouth   BCC (basal cell carcinoma of skin) 06/22/2008   BCC Upper Left Back   BCC (basal cell carcinoma) 11/04/2012   BCC Superficial Mid Forehead    BCC (basal cell carcinoma) 12/02/2013   BCC Mid Forehead   Coronary disease    2 Vessel,inferior wall MI june 2003, PTCA and Stent RCA June 2003, PTCA stent, Left Circumflex OM 02 Mar 2003, Dr Radford Pax   Dilated aortic root ([place])    39m by echo 09/2016   DJD (degenerative joint disease) of knee  LEFT   Dyslipidemia    GERD (gastroesophageal reflux disease)    History of rotator cuff syndrome    (Right) with biceps tendinitis   Hypertension    Melanoma ([place]) 08/27/34   clark level 2 left post scalp tx with mohs Dr Link Snuffer   Nodular basal cell carcinoma (BCC) 10/13/2013   Nod BCC Mid Forehead   Nodular basal cell carcinoma (BCC) 11/14/2015   BCC Nod. Mid Forehead   Nodular basal cell carcinoma (BCC) 07/16/2016   Left Forehead Med   Nodular basal cell carcinoma (BCC) 07/16/2016   Left Forehead Lat   Obesity    OSA (obstructive sleep apnea)    presented with HA and drowsiness,mild sleep apnea with oxygen desats to 78%   Persistent atrial fibrillation (HCC)    Prostate cancer (HCC)     /BPH, radioactive seed 2007, [place]    SCC (squamous cell carcinoma) 06/22/2008   SCC In Situ Left Sideburn   SCC (squamous cell carcinoma) 06/22/2008   SCC In Situ Left Preauricular   SCC (squamous cell carcinoma) 06/22/2008   SCC In Situ Below Right Ear   SCC (squamous cell carcinoma) 11/04/2012   SCC In Situ Lower Right Forearm   SCC (squamous cell carcinoma) 02/11/2014   SCC Left Nasal Blub   SCC (squamous cell carcinoma) 11/14/2015   SCC In Situ Left Side Neck   Visit for monitoring Tikosyn therapy 08/12/2017   Past Surgical History:  Procedure Laterality Date   CARDIAC CATHETERIZATION     CARDIOVERSION N/A 08/27/2013   Procedure: CARDIOVERSION;  Surgeon: Sueanne Margarita, MD;  Location: [facility] ENDOSCOPY;  Service: Cardiovascular;  Laterality: N/A;   CARDIOVERSION N/A 03/15/2016   Procedure: CARDIOVERSION;  Surgeon: Dorothy Spark, MD;  Location: [facility] ENDOSCOPY;  Service: Cardiovascular;  Laterality: N/A;   CARDIOVERSION N/A 10/30/2016   Procedure: CARDIOVERSION;  Surgeon: Jerline Pain, MD;  Location: [facility] ENDOSCOPY;  Service: Cardiovascular;  Laterality: N/A;   CORONARY ANGIOPLASTY     JOINT REPLACEMENT     left knee replacement 2009   TEE WITHOUT CARDIOVERSION N/A 10/30/2016   Procedure: TRANSESOPHAGEAL ECHOCARDIOGRAM (TEE);  Surgeon: Jerline Pain, MD;  Location: [facility] ENDOSCOPY;  Service: Cardiovascular;  Laterality: N/A;     Current Meds  Medication Sig   apixaban (ELIQUIS) 5 MG TABS tablet TAKE 1 TABLET(5 MG) BY MOUTH TWICE DAILY   dofetilide (TIKOSYN) 250 MCG capsule TAKE ONE CAPSULE BY MOUTH TWICE A DAY   ezetimibe (ZETIA) 10 MG tablet TAKE 1 TABLET(10 MG) BY MOUTH DAILY   fluticasone (FLONASE) 50 MCG/ACT nasal spray Place 1 spray into both nostrils daily as needed for allergies or rhinitis.   irbesartan (AVAPRO) 300 MG tablet Take 300 mg by mouth daily.   metoprolol tartrate (LOPRESSOR) 25 MG tablet Take 0.5 tablets (12.5 mg total) by mouth 2 (two) times daily.    nitroGLYCERIN (NITROSTAT) 0.4 MG SL tablet Place 1 tablet (0.4 mg total) under the tongue every 5 (five) minutes as needed for chest pain.   PFIZER COVID-19 VAC BIVALENT injection    rosuvastatin (CRESTOR) 5 MG tablet TAKE 1 TABLET(5 MG) BY MOUTH 3 TIMES A WEEK. FOLLOW UP APPOINTMENT WITH DOCTOR Linlee Cromie FOR FURTHER REFILLS SECOND ATTEMPT   tamsulosin (FLOMAX) 0.4 MG CAPS capsule Take 0.4 mg by mouth daily.   traZODone (DESYREL) 50 MG tablet Take 50 mg by mouth at bedtime as needed.   triamcinolone cream (KENALOG) 0.1 % APPLY TO THE AFFECTED AREA EVERY DAY AS NEEDED   vitamin  B-12 (CYANOCOBALAMIN) 100 MCG tablet Take 100 mcg by mouth daily.     Allergies:   Prednisone, Zocor [simvastatin], Yellow jacket venom [bee venom], Hydrocodone, and Oxycodone hcl   Social History   Tobacco Use   Smoking status: Former    Types: Cigarettes    Quit date: 01/28/1958    Years since quitting: 63.3   Smokeless tobacco: Never  Vaping Use   Vaping Use: Never used  Substance Use Topics   Alcohol use: Yes    Comment: "a few a week"   Drug use: No     Family Hx: The patient's family history includes Cancer in his brother; Coronary artery disease in his brother and brother; Diabetes in his mother; Fainting in his brother; Heart attack in his brother and father; Liver cancer in his brother; Peripheral vascular disease in his father; Sudden death in his brother.  ROS:   Please see the history of present illness.     All other systems reviewed and are negative.   Labs/Other Tests and Data Reviewed:    Recent Labs: No results found for requested labs within last 8760 hours.   Recent Lipid Panel Lab Results  Component Value Date/Time   CHOL 116 10/15/2019 07:38 AM   CHOL 101 06/17/2013 08:21 AM   TRIG 87 10/15/2019 07:38 AM   TRIG 75 06/17/2013 08:21 AM   HDL 41 10/15/2019 07:38 AM   HDL 42 06/17/2013 08:21 AM   CHOLHDL 2.8 10/15/2019 07:38 AM   CHOLHDL 2.7 09/18/2015 09:02 AM   LDLCALC 58  10/15/2019 07:38 AM   LDLCALC 44 06/17/2013 08:21 AM    Wt Readings from Last 3 Encounters:  05/29/21 242 lb (109.8 kg)  06/15/20 249 lb (112.9 kg)  11/09/19 240 lb 9.6 oz (109.1 kg)     Objective:    Vital Signs:  Ht '6\' 2"'$  (1.88 m)   Wt 242 lb (109.8 kg)   BMI 31.07 kg/m   Well nourished, well developed male in no acute distress. Well appearing, alert and conversant, regular work of breathing,  good skin color  Eyes- anicteric mouth- oral mucosa is pink  neuro- grossly intact skin- no apparent rash or lesions or cyanosis  ASSESSMENT & PLAN:    1.  OSA -  The patient is tolerating PAP therapy well without any problems. The PAP download was reviewed today and showed an AHI of 6.2/hr on 7 cm H2O with 100% compliance in using more than 4 hours nightly.  The patient has been using and benefiting from PAP use and will continue to benefit from therapy.    2.  HTN -His BP goal is < 130/71mHg -BP was 120/666mg at home today -he is having some problems with low BP in the am when he works in the garden but then his BP goes high in the mid day to afternoon and then takes his Irbesartan.   -I have recommended taking Irbesartan '300mg'$  in the am and then change Lopressor to Toprol XL '25mg'$  that he takes at bedtime.  -check Bp daily for a week and call with results.   3.  Obesity  -I have encouraged him to get into a routine exercise program and cut back on carbs and portions.   4.  ASCAD  -s/p inferior MI 2003 with PCI of RCA 6/03 and then PCI of left circ/OM 2/05.   -he denies any chest pain or SOB -continue prescription drug management with BB, ASA and statin  -No ASA due to  DOAC   5.  Mild AS  - stable by echo 09/2018 -echo 9/22 showed no AS with mean AVG 7mHg   6.  Hyperlipidemia  -LDL goal is less than 70.   -Continue prescription drug management with Crestor '5mg'$  3 times weekly and Zetia '10mg'$  daily with PRN refills  -I will get a copy of FLP and ALT from PAP  7.  Paroxysmal  atrial fibrillation  -he has not had any breakthrough of palpitations -Continue prescription drug management with Tikosyn 2534m BID and apixaban '5mg'$  BID with PRN refills -I will get a copy of BMET and CBC from PCP   8. Dilated aortic root  -Echo 09/2019  Aortic root increased from 38 to 4264mContinue statin and BP control -Chest CTA 3/22 showed normal dimensions  .  Time:   Today, I have spent 15 minutes on telemedicine discussing medical problems including CAD, PAF, dilated aortic root, OSA and eviewing patient's chart including 2D echo and labs.  Medication Adjustments/Labs and Tests Ordered: Current medicines are reviewed at length with the patient today.  Concerns regarding medicines are outlined above.  Tests Ordered: No orders of the defined types were placed in this encounter.  Medication Changes: No orders of the defined types were placed in this encounter.   Disposition:  Follow up in 1 year(s)  Signed, TraFransico HimD  05/29/2021 9:08 AM    [facility]

## 2021-05-30 ENCOUNTER — Ambulatory Visit: Payer: PPO | Admitting: Dermatology

## 2021-05-30 DIAGNOSIS — Z1283 Encounter for screening for malignant neoplasm of skin: Secondary | ICD-10-CM | POA: Diagnosis not present

## 2021-05-30 DIAGNOSIS — Z85828 Personal history of other malignant neoplasm of skin: Secondary | ICD-10-CM

## 2021-05-30 DIAGNOSIS — L570 Actinic keratosis: Secondary | ICD-10-CM

## 2021-05-30 DIAGNOSIS — L821 Other seborrheic keratosis: Secondary | ICD-10-CM

## 2021-05-30 DIAGNOSIS — D485 Neoplasm of uncertain behavior of skin: Secondary | ICD-10-CM

## 2021-05-30 DIAGNOSIS — D044 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: Secondary | ICD-10-CM | POA: Diagnosis not present

## 2021-05-30 NOTE — Patient Instructions (Signed)

## 2021-05-31 ENCOUNTER — Encounter: Payer: Self-pay | Admitting: Cardiology

## 2021-05-31 DIAGNOSIS — I10 Essential (primary) hypertension: Secondary | ICD-10-CM

## 2021-06-05 ENCOUNTER — Encounter: Payer: Self-pay | Admitting: Cardiology

## 2021-06-11 ENCOUNTER — Other Ambulatory Visit: Payer: PPO | Admitting: *Deleted

## 2021-06-11 DIAGNOSIS — I10 Essential (primary) hypertension: Secondary | ICD-10-CM | POA: Diagnosis not present

## 2021-06-12 LAB — BASIC METABOLIC PANEL
BUN/Creatinine Ratio: 28 — ABNORMAL HIGH (ref 10–24)
BUN: 36 mg/dL — ABNORMAL HIGH (ref 8–27)
CO2: 20 mmol/L (ref 20–29)
Calcium: 9.2 mg/dL (ref 8.6–10.2)
Chloride: 106 mmol/L (ref 96–106)
Creatinine, Ser: 1.27 mg/dL (ref 0.76–1.27)
Glucose: 88 mg/dL (ref 70–99)
Potassium: 4.6 mmol/L (ref 3.5–5.2)
Sodium: 139 mmol/L (ref 134–144)
eGFR: 55 mL/min/{1.73_m2} — ABNORMAL LOW (ref >59)

## 2021-06-15 ENCOUNTER — Encounter: Payer: Self-pay | Admitting: Cardiology

## 2021-06-17 ENCOUNTER — Encounter: Payer: Self-pay | Admitting: Dermatology

## 2021-06-17 NOTE — Progress Notes (Signed)
   Follow-Up Visit   Subjective  Randall Chen is a 88 y.o. male who presents for the following: Annual Exam (6 month skin exam. History of non mole skin cancers. Patient does want left side side neck looked at. Its irritated. ).  Annual skin check, enlarging spot left back plus other crusts Location:  Duration:  Quality:  Associated Signs/Symptoms: Modifying Factors:  Severity:  Timing: Context:   Objective  Well appearing patient in no apparent distress; mood and affect are within normal limits. Mid Back, Right Anterior Neck Multiple 5 to 12 mm brown textured flattopped papules, compatible dermoscopy  Left Anterior Neck, Left Forearm - Anterior (3), Right Forehead Half dozen gritty and hornlike 2 to 5 mm pink crusts  Left Anterior Neck Waxy 9 mm crust, superficial carcinoma         All skin waist up examined.   Assessment & Plan    Seborrheic keratosis (2) Mid Back; Right Anterior Neck  Leave if stable  AK (actinic keratosis) (5) Left Forearm - Anterior (3); Left Anterior Neck; Right Forehead  Destruction of lesion - Left Anterior Neck, Left Forearm - Anterior, Right Forehead Complexity: simple   Destruction method: cryotherapy   Informed consent: discussed and consent obtained   Timeout:  patient name, date of birth, surgical site, and procedure verified Lesion destroyed using liquid nitrogen: Yes   Cryotherapy cycles:  5 Outcome: patient tolerated procedure well with no complications   Post-procedure details: wound care instructions given    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 Left Anterior Neck  Skin / nail biopsy Type of biopsy: tangential   Informed consent: discussed and consent obtained   Timeout: patient name, date of birth, surgical site, and procedure verified   Anesthesia: the lesion was anesthetized in a standard fashion   Anesthetic:  1% lidocaine w/ epinephrine 1-100,000 local infiltration Instrument used: flexible razor  blade   Hemostasis achieved with: ferric subsulfate   Outcome: patient tolerated procedure well   Post-procedure details: wound care instructions given    Destruction of lesion Complexity: simple   Destruction method: electrodesiccation and curettage   Informed consent: discussed and consent obtained   Timeout:  patient name, date of birth, surgical site, and procedure verified Anesthesia: the lesion was anesthetized in a standard fashion   Anesthetic:  1% lidocaine w/ epinephrine 1-100,000 local infiltration Curettage performed in three different directions: Yes   Curettage cycles:  3 Lesion length (cm):  1 Lesion width (cm):  1 Margin per side (cm):  0 Final wound size (cm):  1 Hemostasis achieved with:  aluminum chloride Outcome: patient tolerated procedure well with no complications   Post-procedure details: wound care instructions given    Specimen 1 - Surgical pathology Differential Diagnosis: scc vs bcc txpbx Check Margins: No  After shave biopsy the base was treated with curettage plus cautery      I, Lavonna Monarch, MD, have reviewed all documentation for this visit.  The documentation on 06/17/21 for the exam, diagnosis, procedures, and orders are all accurate and complete.

## 2021-06-18 DIAGNOSIS — H903 Sensorineural hearing loss, bilateral: Secondary | ICD-10-CM | POA: Diagnosis not present

## 2021-06-22 ENCOUNTER — Other Ambulatory Visit: Payer: Self-pay | Admitting: Internal Medicine

## 2021-07-01 ENCOUNTER — Encounter: Payer: Self-pay | Admitting: Cardiology

## 2021-07-03 DIAGNOSIS — M1711 Unilateral primary osteoarthritis, right knee: Secondary | ICD-10-CM | POA: Diagnosis not present

## 2021-07-10 ENCOUNTER — Encounter: Payer: Self-pay | Admitting: Internal Medicine

## 2021-07-10 DIAGNOSIS — M1711 Unilateral primary osteoarthritis, right knee: Secondary | ICD-10-CM | POA: Diagnosis not present

## 2021-07-10 DIAGNOSIS — M5416 Radiculopathy, lumbar region: Secondary | ICD-10-CM | POA: Diagnosis not present

## 2021-07-17 DIAGNOSIS — M1711 Unilateral primary osteoarthritis, right knee: Secondary | ICD-10-CM | POA: Diagnosis not present

## 2021-07-17 DIAGNOSIS — I482 Chronic atrial fibrillation, unspecified: Secondary | ICD-10-CM | POA: Diagnosis not present

## 2021-07-17 DIAGNOSIS — I2510 Atherosclerotic heart disease of native coronary artery without angina pectoris: Secondary | ICD-10-CM | POA: Diagnosis not present

## 2021-07-17 DIAGNOSIS — K219 Gastro-esophageal reflux disease without esophagitis: Secondary | ICD-10-CM | POA: Diagnosis not present

## 2021-07-17 DIAGNOSIS — E7800 Pure hypercholesterolemia, unspecified: Secondary | ICD-10-CM | POA: Diagnosis not present

## 2021-07-17 DIAGNOSIS — N400 Enlarged prostate without lower urinary tract symptoms: Secondary | ICD-10-CM | POA: Diagnosis not present

## 2021-07-17 DIAGNOSIS — I10 Essential (primary) hypertension: Secondary | ICD-10-CM | POA: Diagnosis not present

## 2021-07-18 ENCOUNTER — Ambulatory Visit: Payer: PPO | Admitting: Internal Medicine

## 2021-07-18 ENCOUNTER — Encounter: Payer: Self-pay | Admitting: Internal Medicine

## 2021-07-18 VITALS — BP 122/68 | HR 55 | Ht 74.0 in | Wt 248.4 lb

## 2021-07-18 DIAGNOSIS — I495 Sick sinus syndrome: Secondary | ICD-10-CM | POA: Diagnosis not present

## 2021-07-18 DIAGNOSIS — I480 Paroxysmal atrial fibrillation: Secondary | ICD-10-CM

## 2021-07-18 NOTE — Progress Notes (Signed)
HPI [facility] Chen returns today for followup of his atrial fib. He had done well with no symptoms on dofetilide since he cut back on the red wine. He denies medical non-compliance. He remains active though and he has not gained any weight since his last visit. No sob or chest Chen or edema. Allergies  Allergen Reactions   Prednisone Other (See Comments)    anxiety   Zocor [Simvastatin] Other (See Comments)    Muscle aches   Yellow Jacket Venom [Bee Venom] Swelling    Swelling and raised bumps at site   Hydrocodone Rash   Oxycodone Hcl Rash     Current Outpatient Medications  Medication Sig Dispense Refill   apixaban (ELIQUIS) 5 MG TABS tablet TAKE 1 TABLET(5 MG) BY MOUTH TWICE DAILY 60 tablet 5   dofetilide (TIKOSYN) 250 MCG capsule TAKE ONE CAPSULE BY MOUTH TWICE A DAY 180 capsule 3   ezetimibe (ZETIA) 10 MG tablet TAKE 1 TABLET(10 MG) BY MOUTH DAILY 90 tablet 3   fluticasone (FLONASE) 50 MCG/ACT nasal spray Place 1 spray into both nostrils daily as needed for allergies or rhinitis.     irbesartan (AVAPRO) 300 MG tablet Take 300 mg by mouth daily.     magnesium oxide (MAG-OX) 400 MG tablet Take 1 tablet (400 mg total) by mouth daily. 90 tablet 1   metoprolol succinate (TOPROL XL) 25 MG 24 hr tablet Take 1 tablet (25 mg total) by mouth every evening. 90 tablet 3   nitroGLYCERIN (NITROSTAT) 0.4 MG SL tablet Place 1 tablet (0.4 mg total) under the tongue every 5 (five) minutes as needed for chest Chen. 25 tablet 5   omeprazole (PRILOSEC) 40 MG capsule Take 40 mg by mouth daily as needed (heartburn).     PFIZER COVID-19 VAC BIVALENT injection      rosuvastatin (CRESTOR) 5 MG tablet TAKE 1 TABLET(5 MG) BY MOUTH 3 TIMES A WEEK. FOLLOW UP APPOINTMENT WITH DOCTOR TURNER FOR FURTHER REFILLS SECOND ATTEMPT 36 tablet 2   tamsulosin (FLOMAX) 0.4 MG CAPS capsule Take 0.4 mg by mouth daily.     traZODone (DESYREL) 50 MG tablet Take 50 mg by mouth at bedtime as needed.     triamcinolone cream  (KENALOG) 0.1 % APPLY TO THE AFFECTED AREA EVERY DAY AS NEEDED 454 g 1   vitamin B-12 (CYANOCOBALAMIN) 100 MCG tablet Take 100 mcg by mouth daily.     No current facility-administered medications for this visit.   Facility-Administered Medications Ordered in Other Visits  Medication Dose Route Frequency Provider Last Rate Last Admin   0.9 %  sodium chloride infusion    Continuous PRN [facility] Dell, CRNA   New Bag at 07/19/13 0754     Past Medical History:  Diagnosis Date   Aortic stenosis    mild by echo 09/2017   BCC (basal cell carcinoma of skin) 05/19/2001   Pigmented BCC Left Outer mouth   BCC (basal cell carcinoma of skin) 06/22/2008   BCC Upper Left Back   BCC (basal cell carcinoma) 11/04/2012   BCC Superficial Mid Forehead    BCC (basal cell carcinoma) 12/02/2013   BCC Mid Forehead   Coronary disease    2 Vessel,inferior wall MI june 2003, PTCA and Stent RCA June 2003, PTCA stent, Left Circumflex OM 02 Mar 2003, [facility] Chen   Dilated aortic root ([place])    29m by echo 09/2016   DJD (degenerative joint disease) of knee    LEFT  Dyslipidemia    GERD (gastroesophageal reflux disease)    History of rotator cuff syndrome    (Right) with biceps tendinitis   Hypertension    Melanoma ([place]) 30-Mar-1932   clark level 2 left post scalp tx with mohs [facility]   Nodular basal cell carcinoma (BCC) 10/13/2013   Nod BCC Mid Forehead   Nodular basal cell carcinoma (BCC) 11/14/2015   BCC Nod. Mid Forehead   Nodular basal cell carcinoma (BCC) 07/16/2016   Left Forehead Med   Nodular basal cell carcinoma (BCC) 07/16/2016   Left Forehead Lat   Obesity    OSA (obstructive sleep apnea)    presented with HA and drowsiness,mild sleep apnea with oxygen desats to 78%   Persistent atrial fibrillation (HCC)    Prostate cancer (HCC)    /BPH, radioactive seed 2007, [facility]    SCC (squamous cell carcinoma) 06/22/2008   SCC In Situ Left Sideburn   SCC (squamous cell carcinoma) 06/22/2008    SCC In Situ Left Preauricular   SCC (squamous cell carcinoma) 06/22/2008   SCC In Situ Below Right Ear   SCC (squamous cell carcinoma) 11/04/2012   SCC In Situ Lower Right Forearm   SCC (squamous cell carcinoma) 02/11/2014   SCC Left Nasal Blub   SCC (squamous cell carcinoma) 11/14/2015   SCC In Situ Left Side Neck   Visit for monitoring Tikosyn therapy 08/12/2017    ROS:   All systems reviewed and negative except as noted in the HPI.   Past Surgical History:  Procedure Laterality Date   CARDIAC CATHETERIZATION     CARDIOVERSION N/A 08/27/2013   Procedure: CARDIOVERSION;  Surgeon: [facility] Margarita, MD;  Location: [facility];  Service: Cardiovascular;  Laterality: N/A;   CARDIOVERSION N/A 03/15/2016   Procedure: CARDIOVERSION;  Surgeon: [facility] Spark, MD;  Location: [place];  Service: Cardiovascular;  Laterality: N/A;   CARDIOVERSION N/A 10/30/2016   Procedure: CARDIOVERSION;  Surgeon: [facility] Pain, MD;  Location: [facility] ENDOSCOPY;  Service: Cardiovascular;  Laterality: N/A;   CORONARY ANGIOPLASTY     JOINT REPLACEMENT     left knee replacement 2009   TEE WITHOUT CARDIOVERSION N/A 10/30/2016   Procedure: TRANSESOPHAGEAL ECHOCARDIOGRAM (TEE);  Surgeon: [facility] Pain, MD;  [facility]COPY;  Service: Cardiovascular;  Laterality: N/A;     Family History  Problem Relation Age of Onset   Diabetes Randall Chen    Peripheral vascular disease Randall Chen    Heart attack Randall Chen    Coronary artery disease Randall Chen    Liver cancer Randall Chen    Coronary artery disease Randall Chen    Heart attack Randall Chen    Cancer Randall Chen    Sudden death Randall Chen    Fainting Randall Chen      Social History   Socioeconomic History   Marital status: Married    Spouse name: Not on file   Number of children: Not on file   Years of education: Not on file   Highest education level: Not on file  Occupational History   Not on file  Tobacco Use   Smoking status: Former    Types: Cigarettes    Quit date:  01/28/1958    Years since quitting: 63.5   Smokeless tobacco: Never  Vaping Use   Vaping Use: Never used  Substance and Sexual Activity   Alcohol use: Yes    Comment: "a few a week"   Drug use: No   Sexual activity: Not on file  Other Topics Concern   Not on  file  Social History Narrative   Not on file   Social Determinants of Health   Financial Resource Strain: Not on file  Food Insecurity: Not on file  Transportation Needs: Not on file  Physical Activity: Not on file  Stress: Not on file  Social Connections: Not on file  Intimate Partner Violence: Not on file     BP 122/68   Pulse (!) 55   Ht '6\' 2"'$  (1.88 m)   Wt 248 lb 6.4 oz (112.7 kg)   SpO2 95%   BMI 31.89 kg/m   Physical Exam:  Elderly but well appearing NAD HEENT: Unremarkable Neck:  6 cm JVD, no thyromegally Lymphatics:  No adenopathy Back:  No CVA tenderness Lungs:  Clear with no wheezes HEART:  Regular rate rhythm, no murmurs, no rubs, no clicks Abd:  soft, positive bowel sounds, no organomegally, no rebound, no guarding Ext:  2 plus pulses, no edema, no cyanosis, no clubbing Skin:  No rashes no nodules Neuro:  CN II through XII intact, motor grossly intact  EKG - nsr  Assess/Plan:  PAF - he continues to maintain NSR very nicely. He will continue his current meds. Obesity - he is encouraged to lose weight with goal to get his bmi down to under 30. He has some difficulty with exercise due to spinal stenosis. Sinus node dysfunction - he is asymptomatic with no indication for PPM insertion. Dyslipidemia - he will continue crestor.  Randall Overlie Agripina Guyette,MD

## 2021-07-18 NOTE — Patient Instructions (Signed)
Medication Instructions:  ?Your physician recommends that you continue on your current medications as directed. Please refer to the Current Medication list given to you today. ? ?Labwork: ?None ordered. ? ?Testing/Procedures: ?None ordered. ? ?Follow-Up: ? ?Your physician wants you to follow-up in: one year with Gregg Taylor, MD or one of the following Advanced Practice Providers on your designated Care Team:   ?Renee Ursuy, PA-C ?Michael "Andy" Tillery, PA-C ? ? ?Any Other Special Instructions Will Be Listed Below (If Applicable). ? ?If you need a refill on your cardiac medications before your next appointment, please call your pharmacy.  ? ?Important Information About Sugar ? ? ? ? ? ? ? ?

## 2021-08-02 ENCOUNTER — Telehealth: Payer: Self-pay | Admitting: Internal Medicine

## 2021-08-02 NOTE — Telephone Encounter (Signed)
   Pre-operative Risk Assessment    Patient Name: [facility] Chen  DOB: 1932/02/06 MRN: [id number]      Request for Surgical Clearance    Procedure:   Root Canal therapy   Date of Surgery:  Clearance 10/07/21                                 Surgeon:  Dr. Carlyle Dolly Surgeon's Group or Practice Name:  [facility]  Phone number:  [phone] Fax number:  [phone]   Type of Clearance Requested:   - Medical  Pharmacy: not sure   Type of Anesthesia:  Local    Additional requests/questions:    SignedFoye Clock   08/02/2021, 3:52 PM

## 2021-08-03 NOTE — Telephone Encounter (Signed)
I s/w the DDS office to inquire if Eliquis needs to be held for procedure. Per Eliezer Lofts, she stated generally the DDS will hold blood thinners x 3 days, though DDS is not in the office today. Eliezer Lofts, stated she will call back on Monday after speaking with the DDS in regard to hold time for blood thinner if any needed.

## 2021-08-09 ENCOUNTER — Ambulatory Visit: Payer: PPO | Admitting: Internal Medicine

## 2021-08-27 NOTE — Telephone Encounter (Signed)
    Primary Cardiologist: Fransico Him, MD  Chart reviewed as part of pre-operative protocol coverage. Simple dental extractions are considered low risk procedures per guidelines and generally do not require any specific cardiac clearance. It is also generally accepted that for simple extractions and dental cleanings, there is no need to interrupt blood thinner therapy.   SBE prophylaxis is not required for the patient.  I will route this recommendation to the requesting party via Epic fax function and remove from pre-op pool.  Please call with questions.  Deberah Pelton, NP 08/27/2021, 11:48 AM

## 2021-09-03 ENCOUNTER — Encounter: Payer: Self-pay | Admitting: Internal Medicine

## 2021-09-03 MED ORDER — DOFETILIDE 250 MCG PO CAPS: ORAL_CAPSULE | ORAL | 3 refills | Status: DC

## 2021-09-18 DIAGNOSIS — M25512 Pain in left shoulder: Secondary | ICD-10-CM | POA: Diagnosis not present

## 2021-09-18 DIAGNOSIS — M542 Cervicalgia: Secondary | ICD-10-CM | POA: Diagnosis not present

## 2021-09-24 DIAGNOSIS — M19012 Primary osteoarthritis, left shoulder: Secondary | ICD-10-CM | POA: Diagnosis not present

## 2021-10-04 DIAGNOSIS — M19012 Primary osteoarthritis, left shoulder: Secondary | ICD-10-CM | POA: Diagnosis not present

## 2021-10-10 DIAGNOSIS — I10 Essential (primary) hypertension: Secondary | ICD-10-CM | POA: Diagnosis not present

## 2021-10-10 DIAGNOSIS — R911 Solitary pulmonary nodule: Secondary | ICD-10-CM | POA: Diagnosis not present

## 2021-10-10 DIAGNOSIS — I482 Chronic atrial fibrillation, unspecified: Secondary | ICD-10-CM | POA: Diagnosis not present

## 2021-10-10 DIAGNOSIS — D6869 Other thrombophilia: Secondary | ICD-10-CM | POA: Diagnosis not present

## 2021-10-10 DIAGNOSIS — I2510 Atherosclerotic heart disease of native coronary artery without angina pectoris: Secondary | ICD-10-CM | POA: Diagnosis not present

## 2021-10-10 DIAGNOSIS — N400 Enlarged prostate without lower urinary tract symptoms: Secondary | ICD-10-CM | POA: Diagnosis not present

## 2021-10-10 DIAGNOSIS — I700 Atherosclerosis of aorta: Secondary | ICD-10-CM | POA: Diagnosis not present

## 2021-10-10 DIAGNOSIS — N1831 Chronic kidney disease, stage 3a: Secondary | ICD-10-CM | POA: Diagnosis not present

## 2021-10-10 DIAGNOSIS — G4733 Obstructive sleep apnea (adult) (pediatric): Secondary | ICD-10-CM | POA: Diagnosis not present

## 2021-10-15 DIAGNOSIS — M19012 Primary osteoarthritis, left shoulder: Secondary | ICD-10-CM | POA: Diagnosis not present

## 2021-10-25 DIAGNOSIS — M19012 Primary osteoarthritis, left shoulder: Secondary | ICD-10-CM | POA: Diagnosis not present

## 2021-10-26 DIAGNOSIS — Z20822 Contact with and (suspected) exposure to covid-19: Secondary | ICD-10-CM | POA: Diagnosis not present

## 2021-10-27 DIAGNOSIS — G4733 Obstructive sleep apnea (adult) (pediatric): Secondary | ICD-10-CM | POA: Diagnosis not present

## 2021-11-30 ENCOUNTER — Other Ambulatory Visit: Payer: Self-pay | Admitting: Cardiology

## 2021-12-05 DIAGNOSIS — Z85820 Personal history of malignant melanoma of skin: Secondary | ICD-10-CM | POA: Diagnosis not present

## 2021-12-05 DIAGNOSIS — D1801 Hemangioma of skin and subcutaneous tissue: Secondary | ICD-10-CM | POA: Diagnosis not present

## 2021-12-05 DIAGNOSIS — L820 Inflamed seborrheic keratosis: Secondary | ICD-10-CM | POA: Diagnosis not present

## 2021-12-05 DIAGNOSIS — L538 Other specified erythematous conditions: Secondary | ICD-10-CM | POA: Diagnosis not present

## 2021-12-05 DIAGNOSIS — L298 Other pruritus: Secondary | ICD-10-CM | POA: Diagnosis not present

## 2021-12-05 DIAGNOSIS — R208 Other disturbances of skin sensation: Secondary | ICD-10-CM | POA: Diagnosis not present

## 2021-12-05 DIAGNOSIS — Z789 Other specified health status: Secondary | ICD-10-CM | POA: Diagnosis not present

## 2021-12-05 DIAGNOSIS — L814 Other melanin hyperpigmentation: Secondary | ICD-10-CM | POA: Diagnosis not present

## 2021-12-05 DIAGNOSIS — Z08 Encounter for follow-up examination after completed treatment for malignant neoplasm: Secondary | ICD-10-CM | POA: Diagnosis not present

## 2021-12-05 DIAGNOSIS — L570 Actinic keratosis: Secondary | ICD-10-CM | POA: Diagnosis not present

## 2021-12-05 DIAGNOSIS — L821 Other seborrheic keratosis: Secondary | ICD-10-CM | POA: Diagnosis not present

## 2021-12-06 DIAGNOSIS — H04123 Dry eye syndrome of bilateral lacrimal glands: Secondary | ICD-10-CM | POA: Diagnosis not present

## 2021-12-06 DIAGNOSIS — H524 Presbyopia: Secondary | ICD-10-CM | POA: Diagnosis not present

## 2021-12-06 DIAGNOSIS — H35371 Puckering of macula, right eye: Secondary | ICD-10-CM | POA: Diagnosis not present

## 2021-12-06 DIAGNOSIS — H532 Diplopia: Secondary | ICD-10-CM | POA: Diagnosis not present

## 2021-12-06 DIAGNOSIS — H35363 Drusen (degenerative) of macula, bilateral: Secondary | ICD-10-CM | POA: Diagnosis not present

## 2021-12-09 ENCOUNTER — Encounter: Payer: Self-pay | Admitting: Internal Medicine

## 2021-12-10 ENCOUNTER — Other Ambulatory Visit: Payer: Self-pay

## 2021-12-10 MED ORDER — DOFETILIDE 250 MCG PO CAPS: ORAL_CAPSULE | ORAL | 2 refills | Status: DC

## 2021-12-10 NOTE — Telephone Encounter (Signed)
[facility] on [address]  [place] prescription has been received, medication filled and ready for pick up per pharmacist.    Pt called and made aware of medication ready for him at [facility] on [address].   Pt understood and stated will pick up med.

## 2021-12-10 NOTE — Telephone Encounter (Signed)
Pt's medication was sent to pt's pharmacy as requested. Confirmation received.  °

## 2021-12-13 DIAGNOSIS — M545 Low back pain, unspecified: Secondary | ICD-10-CM | POA: Diagnosis not present

## 2021-12-25 ENCOUNTER — Other Ambulatory Visit: Payer: Self-pay

## 2021-12-25 DIAGNOSIS — I4819 Other persistent atrial fibrillation: Secondary | ICD-10-CM

## 2021-12-25 MED ORDER — APIXABAN 5 MG PO TABS: ORAL_TABLET | ORAL | 1 refills | Status: DC

## 2021-12-25 NOTE — Telephone Encounter (Signed)
Prescription refill request for Eliquis received. Indication: Afib  Last office visit: 07/18/21 Lovena Le)  Scr:  1.27 (06/11/21)  Age: 88 Weight: 112.8kg  Appropriate dose and refill sent to requested pharmacy.

## 2022-01-03 ENCOUNTER — Telehealth: Payer: Self-pay | Admitting: Internal Medicine

## 2022-01-03 NOTE — Telephone Encounter (Signed)
Patient's insurance, [employer] called stating prior-auth is not require for this medication as it's on the formulary.

## 2022-01-03 NOTE — Telephone Encounter (Signed)
**Note De-identified Fitzhugh Vizcarrondo Obfuscation** Noted! Thank you

## 2022-02-01 DIAGNOSIS — M25562 Pain in left knee: Secondary | ICD-10-CM | POA: Diagnosis not present

## 2022-02-01 DIAGNOSIS — M25561 Pain in right knee: Secondary | ICD-10-CM | POA: Diagnosis not present

## 2022-02-04 ENCOUNTER — Other Ambulatory Visit: Payer: Self-pay

## 2022-02-04 MED ORDER — DOFETILIDE 250 MCG PO CAPS: ORAL_CAPSULE | ORAL | 1 refills | Status: DC

## 2022-02-04 NOTE — Telephone Encounter (Signed)
Pt's medication was sent to pt's pharmacy as requested. Confirmation received.  °

## 2022-02-19 DIAGNOSIS — M1711 Unilateral primary osteoarthritis, right knee: Secondary | ICD-10-CM | POA: Diagnosis not present

## 2022-02-26 DIAGNOSIS — M1711 Unilateral primary osteoarthritis, right knee: Secondary | ICD-10-CM | POA: Diagnosis not present

## 2022-02-28 IMAGING — CT CT ANGIO CHEST
2 of 8 series · 16 of 46 positions shown · IV contrast (OMNIPAQUE 350)
Comparison: None.

CLINICAL DATA: 87-year-old male with a history of thoracic aortic
aneurysm

EXAM:
CT ANGIOGRAPHY CHEST WITH CONTRAST
TECHNIQUE: Multidetector CT imaging of the chest was performed using the
standard protocol during bolus administration of intravenous
contrast. Multiplanar CT image reconstructions and MIPs were
obtained to evaluate the vascular anatomy.
CONTRAST:  100mL OMNIPAQUE IOHEXOL 350 MG/ML SOLN

[Series 4: aorta 3.0 bf37 2 · axial · 0.76mm/px · z∈[-332,-44]mm · 13 of 112 slices shown]
[im 8/112  lung]
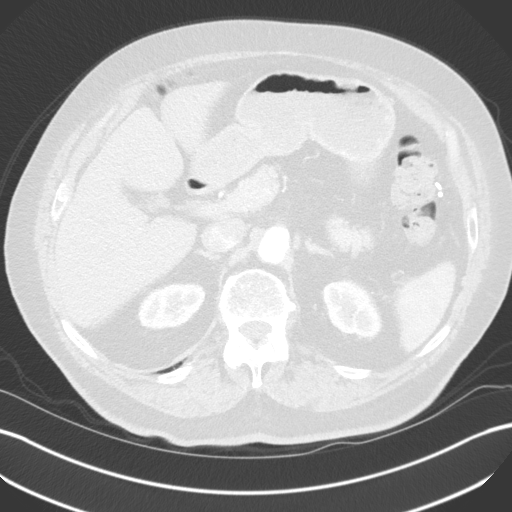
[im 16/112  soft-tissue]
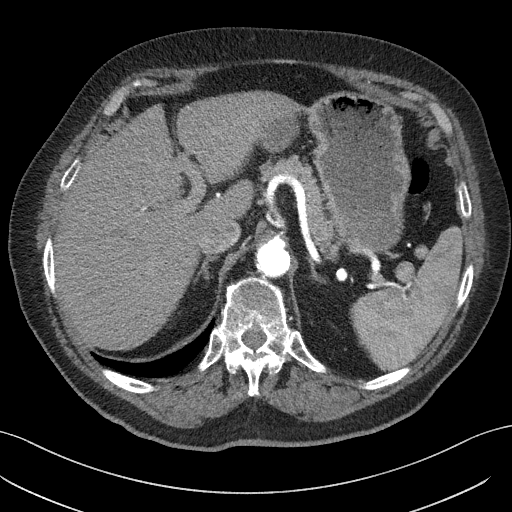
[im 24/112  lung]
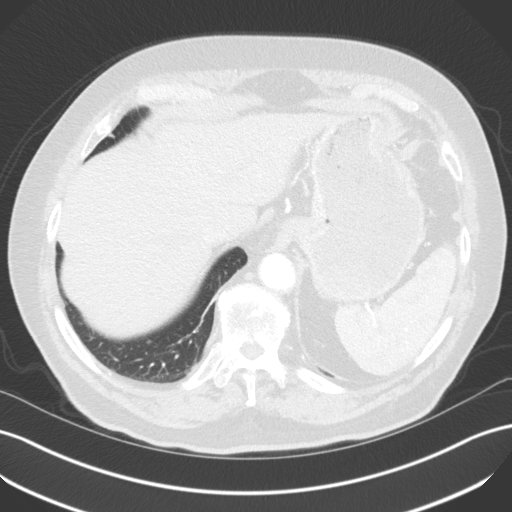
[im 32/112  soft-tissue]
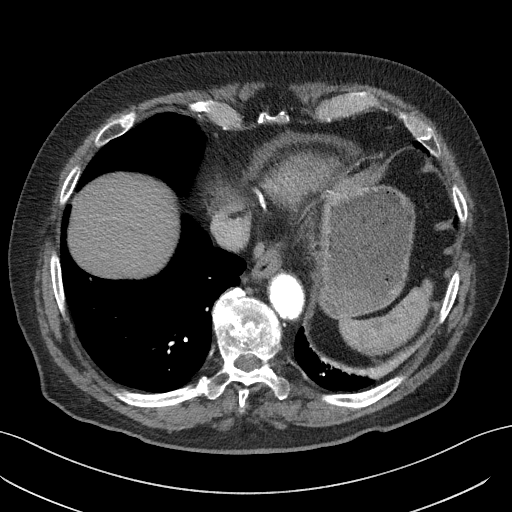
[im 40/112  lung]
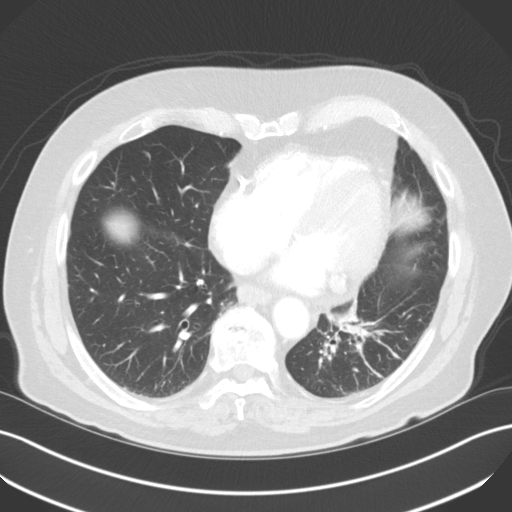
[im 48/112  soft-tissue]
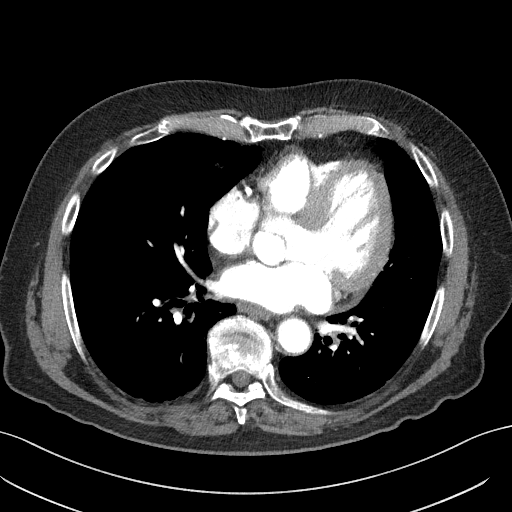
[im 56/112  lung]
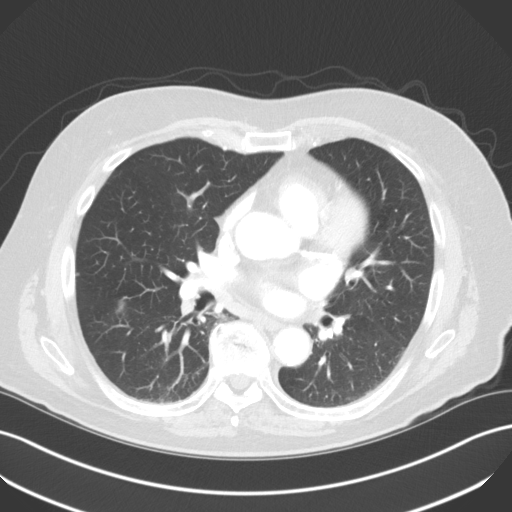
[im 64/112  soft-tissue]
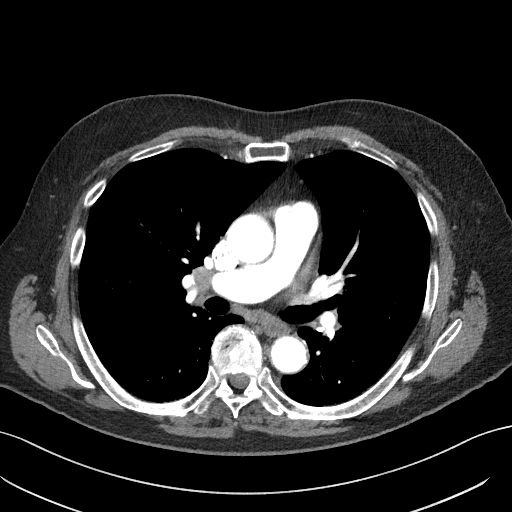
[im 72/112  lung]
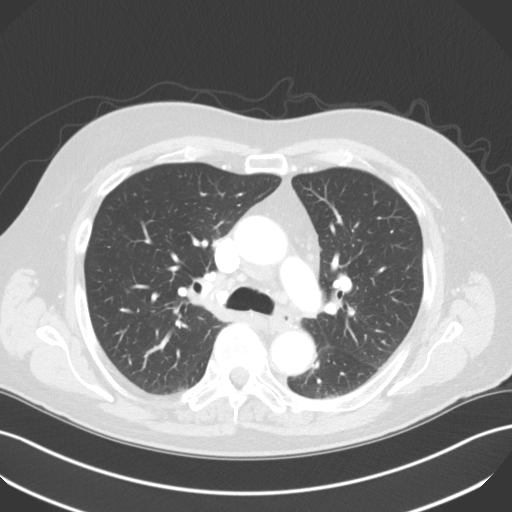
[im 80/112  soft-tissue]
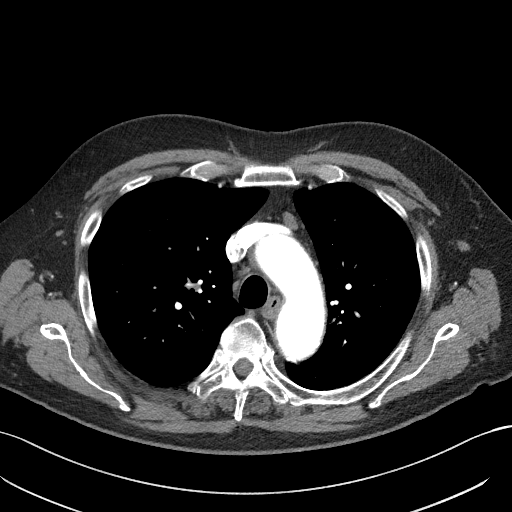
[im 88/112  lung]
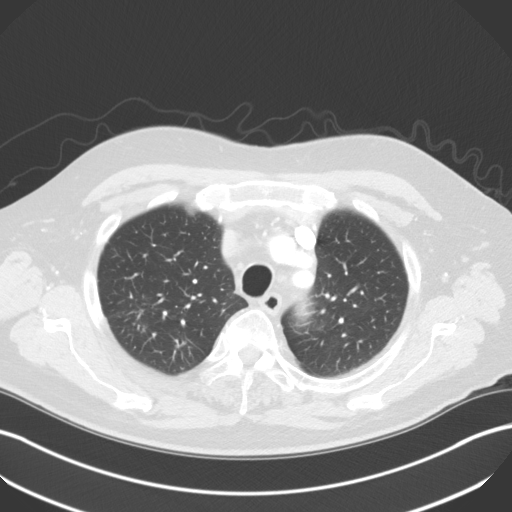
[im 96/112  soft-tissue]
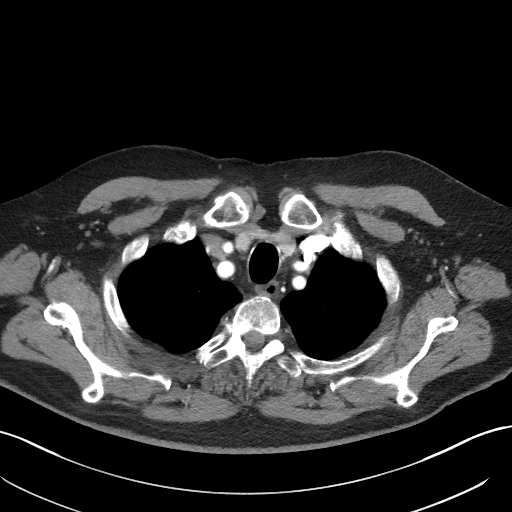
[im 104/112  lung]
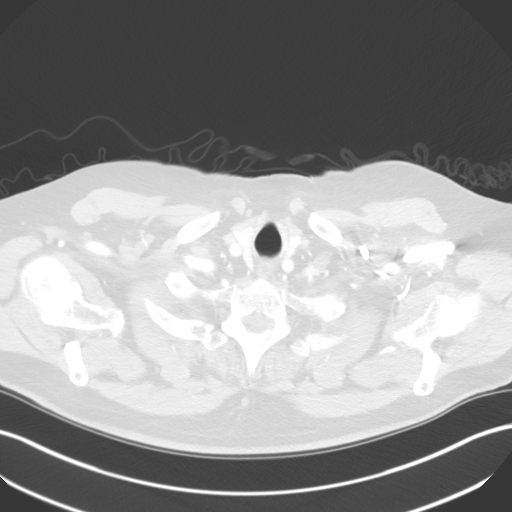

[Series 7: coronals · coronal · 0.67mm/px · 3 of 130 slices shown]
[im 33/130  soft-tissue]
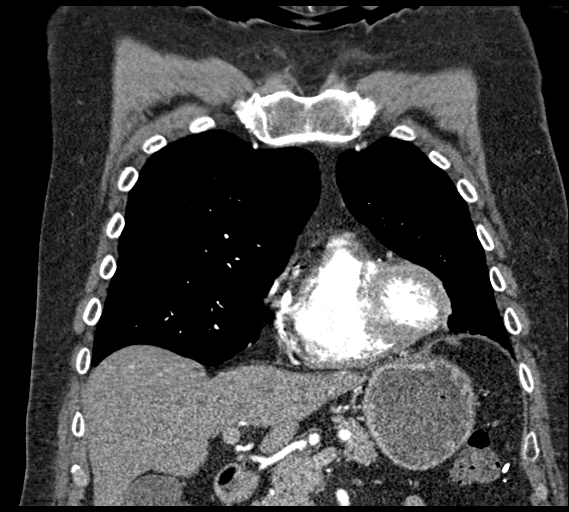
[im 65/130  soft-tissue]
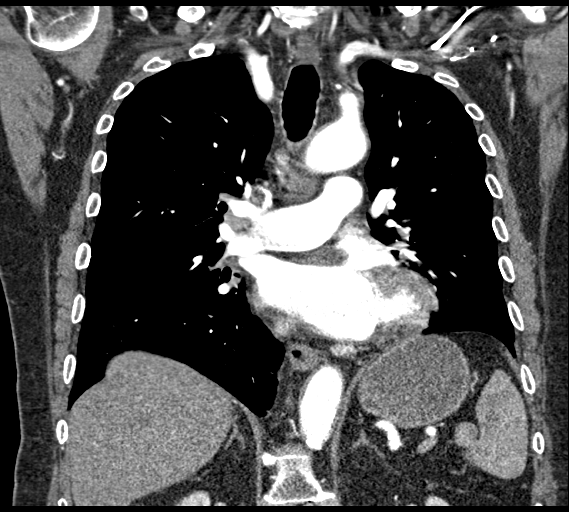
[im 97/130  soft-tissue]
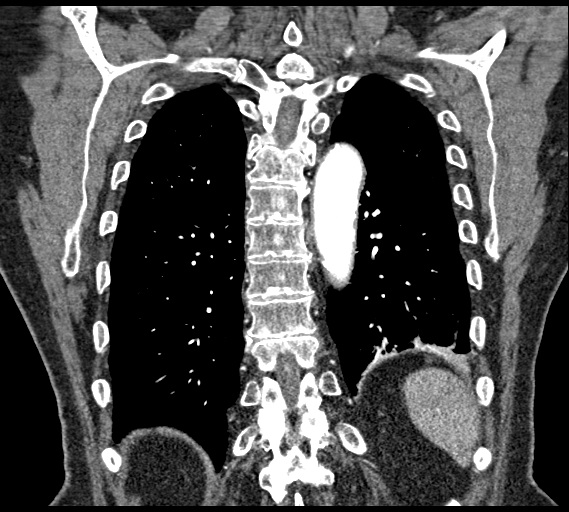

[16 of 46 positions shown; findings below may reference images not displayed]

FINDINGS: Cardiovascular:

Heart:

No cardiomegaly. No pericardial fluid/thickening. Calcifications of
the left main, left anterior descending, circumflex, right coronary
arteries.

Aorta:

Dense calcifications of the aortic valve. Greatest diameter of the
ascending aorta 3.5 cm. No dissection. Minimal atherosclerosis of
the aortic arch and descending thoracic aorta. No periaortic
inflammatory changes. Diameter at the hiatus measures 2.4 cm.

Common origin of the innominate artery an the left common carotid
artery. The cervical cerebral vessels are patent at the base of the
neck.

Pulmonary arteries:

Timing of the contrast bolus is not optimized for evaluation of
pulmonary artery filling defects.

Mediastinum/Nodes: Small lymph nodes of the mediastinum. Index node
in the prevascular station measures 7 mm. No comparison. Partially
calcified bilateral hilar and left peribronchial lymph nodes. Small
hiatal hernia. Circumferential thickening of the distal esophagus
with small hiatal hernia. There is a small lymph node adjacent to
distal esophagus measuring 7 mm.

Unremarkable appearance of the thoracic inlet.

Lungs/Pleura: Linear atelectasis/scarring of the left lower lobe,
associated with bronchiectasis. This is in a region of partially
calcified lymph nodes.

There are a few small scattered subpleural nodules/lymph nodes.

Index nodule of the right lower lobe on image 77 of series 5
measures 4 mm.

Region of scarring or potential early nodularity of the right upper
lobe on image 27 of series 5. This may be followed up on future CT
studies.

Upper Abdomen: No acute finding of the upper abdomen. Calcified
cholelithiasis.

Musculoskeletal: No acute displaced fracture. Degenerative changes
of the spine.

Review of the MIP images confirms the above findings.
IMPRESSION: Negative for aortic aneurysm.

Aortic atherosclerosis and coronary artery disease. Aortic
Atherosclerosis ([id number]).

Nodule of the right lower lobe measures 4 mm. Non-contrast chest CT
is recommended in 12 months, with additional attention to the vague
region of nodularity in the right upper lobe, favored to be
postinflammatory. This recommendation follows the consensus
statement: Guidelines for Management of Incidental Pulmonary Nodules
Detected on CT Images: From the [HOSPITAL] 6879; Radiology
6879; [DATE].

Partially calcified mediastinal lymph nodes, suggesting prior
granulomatous disease.

Additional ancillary findings as above.

## 2022-03-05 DIAGNOSIS — M1711 Unilateral primary osteoarthritis, right knee: Secondary | ICD-10-CM | POA: Diagnosis not present

## 2022-03-12 DIAGNOSIS — H903 Sensorineural hearing loss, bilateral: Secondary | ICD-10-CM | POA: Diagnosis not present

## 2022-03-19 DIAGNOSIS — H903 Sensorineural hearing loss, bilateral: Secondary | ICD-10-CM | POA: Diagnosis not present

## 2022-04-02 ENCOUNTER — Telehealth: Payer: Self-pay | Admitting: Cardiology

## 2022-04-02 DIAGNOSIS — M47817 Spondylosis without myelopathy or radiculopathy, lumbosacral region: Secondary | ICD-10-CM | POA: Diagnosis not present

## 2022-04-02 NOTE — Telephone Encounter (Signed)
[employer] is calling stating authorization needs to be sent in regards to his durable medical equipment.  New insurance for patient.   Call line for auth # [id number]  Fax # [phone]

## 2022-04-03 ENCOUNTER — Encounter (HOSPITAL_BASED_OUTPATIENT_CLINIC_OR_DEPARTMENT_OTHER): Payer: Self-pay | Admitting: Cardiology

## 2022-04-03 ENCOUNTER — Telehealth: Payer: Self-pay | Admitting: *Deleted

## 2022-04-03 DIAGNOSIS — I10 Essential (primary) hypertension: Secondary | ICD-10-CM

## 2022-04-03 DIAGNOSIS — G4733 Obstructive sleep apnea (adult) (pediatric): Secondary | ICD-10-CM

## 2022-04-03 NOTE — Telephone Encounter (Addendum)
Cpap supply order has been sent to [facility] via community message.

## 2022-04-09 NOTE — Telephone Encounter (Signed)
Reached out to patient who states he decided to go with [facility] instead of [facility]. [facility] does not take [employer] so patient is back at [facility]. Patient will bring his new insurance card by the office to be scanned in. I have sent the  number to [place] resupply to his mychart so he can call and order his supplies.

## 2022-04-09 NOTE — Telephone Encounter (Signed)
Upon patient request DME selection is [facility] Patient understands he will be contacted by [facility] to set up his cpap. Patient understands to call if [facility] does not contact him with new setup in a timely manner. Patient understands they will be called once confirmation has been received from [facility] that they have received their new machine to schedule 10 week follow up appointment. [facility] notified of new cpap order  Please add to airview Patient was grateful for the call and thanked me. 

## 2022-05-06 DIAGNOSIS — M1711 Unilateral primary osteoarthritis, right knee: Secondary | ICD-10-CM | POA: Diagnosis not present

## 2022-05-07 ENCOUNTER — Other Ambulatory Visit: Payer: Self-pay | Admitting: Cardiology

## 2022-05-09 ENCOUNTER — Encounter (HOSPITAL_BASED_OUTPATIENT_CLINIC_OR_DEPARTMENT_OTHER): Payer: Self-pay | Admitting: Cardiology

## 2022-05-13 ENCOUNTER — Telehealth: Payer: Self-pay

## 2022-05-13 NOTE — Telephone Encounter (Signed)
Called patient to clarify if he was asking about a yearly echocardiogram. Patient agrees he was. Advised patient he has an appt w/ Dr. Mayford Knife in June, patient was not aware of this and states he is content to wait until he sees Dr. Mayford Knife and do Echo then if that is what she advises.

## 2022-05-20 ENCOUNTER — Other Ambulatory Visit: Payer: Self-pay | Admitting: Cardiology

## 2022-06-03 ENCOUNTER — Ambulatory Visit: Payer: PPO | Admitting: Dermatology

## 2022-06-06 ENCOUNTER — Other Ambulatory Visit: Payer: Self-pay | Admitting: Cardiology

## 2022-06-06 DIAGNOSIS — Z789 Other specified health status: Secondary | ICD-10-CM | POA: Diagnosis not present

## 2022-06-06 DIAGNOSIS — L538 Other specified erythematous conditions: Secondary | ICD-10-CM | POA: Diagnosis not present

## 2022-06-06 DIAGNOSIS — Z08 Encounter for follow-up examination after completed treatment for malignant neoplasm: Secondary | ICD-10-CM | POA: Diagnosis not present

## 2022-06-06 DIAGNOSIS — D225 Melanocytic nevi of trunk: Secondary | ICD-10-CM | POA: Diagnosis not present

## 2022-06-06 DIAGNOSIS — L821 Other seborrheic keratosis: Secondary | ICD-10-CM | POA: Diagnosis not present

## 2022-06-06 DIAGNOSIS — L298 Other pruritus: Secondary | ICD-10-CM | POA: Diagnosis not present

## 2022-06-06 DIAGNOSIS — L820 Inflamed seborrheic keratosis: Secondary | ICD-10-CM | POA: Diagnosis not present

## 2022-06-06 DIAGNOSIS — L814 Other melanin hyperpigmentation: Secondary | ICD-10-CM | POA: Diagnosis not present

## 2022-06-06 DIAGNOSIS — Z85820 Personal history of malignant melanoma of skin: Secondary | ICD-10-CM | POA: Diagnosis not present

## 2022-06-18 DIAGNOSIS — M1711 Unilateral primary osteoarthritis, right knee: Secondary | ICD-10-CM | POA: Diagnosis not present

## 2022-07-04 DIAGNOSIS — I700 Atherosclerosis of aorta: Secondary | ICD-10-CM | POA: Diagnosis not present

## 2022-07-04 DIAGNOSIS — D649 Anemia, unspecified: Secondary | ICD-10-CM | POA: Diagnosis not present

## 2022-07-04 DIAGNOSIS — N400 Enlarged prostate without lower urinary tract symptoms: Secondary | ICD-10-CM | POA: Diagnosis not present

## 2022-07-04 DIAGNOSIS — I10 Essential (primary) hypertension: Secondary | ICD-10-CM | POA: Diagnosis not present

## 2022-07-04 DIAGNOSIS — I482 Chronic atrial fibrillation, unspecified: Secondary | ICD-10-CM | POA: Diagnosis not present

## 2022-07-04 DIAGNOSIS — G4733 Obstructive sleep apnea (adult) (pediatric): Secondary | ICD-10-CM | POA: Diagnosis not present

## 2022-07-04 DIAGNOSIS — N1831 Chronic kidney disease, stage 3a: Secondary | ICD-10-CM | POA: Diagnosis not present

## 2022-07-04 DIAGNOSIS — I25119 Atherosclerotic heart disease of native coronary artery with unspecified angina pectoris: Secondary | ICD-10-CM | POA: Diagnosis not present

## 2022-07-04 DIAGNOSIS — I2510 Atherosclerotic heart disease of native coronary artery without angina pectoris: Secondary | ICD-10-CM | POA: Diagnosis not present

## 2022-07-04 DIAGNOSIS — Z0000 Encounter for general adult medical examination without abnormal findings: Secondary | ICD-10-CM | POA: Diagnosis not present

## 2022-07-04 DIAGNOSIS — Z79899 Other long term (current) drug therapy: Secondary | ICD-10-CM | POA: Diagnosis not present

## 2022-07-06 ENCOUNTER — Other Ambulatory Visit: Payer: Self-pay | Admitting: Internal Medicine

## 2022-07-06 DIAGNOSIS — I4819 Other persistent atrial fibrillation: Secondary | ICD-10-CM

## 2022-07-08 NOTE — Telephone Encounter (Signed)
Eliquis 5mg  refill request received. Patient is 89 years old, weight-112.7kg, Crea-1.46 on 07/04/22 via [facility] from [place], [place], and last seen by Dr. Ladona Ridgel on 07/18/21 and pending an appt with Dr. Mayford Knife on 07/22/22. Dose is appropriate based on dosing criteria. Will send in refill to requested pharmacy.

## 2022-07-09 ENCOUNTER — Encounter: Payer: Self-pay | Admitting: Internal Medicine

## 2022-07-10 ENCOUNTER — Encounter: Payer: Self-pay | Admitting: Internal Medicine

## 2022-07-10 DIAGNOSIS — R911 Solitary pulmonary nodule: Secondary | ICD-10-CM

## 2022-07-11 ENCOUNTER — Other Ambulatory Visit: Payer: Self-pay | Admitting: Internal Medicine

## 2022-07-11 DIAGNOSIS — R911 Solitary pulmonary nodule: Secondary | ICD-10-CM

## 2022-07-22 ENCOUNTER — Ambulatory Visit: Payer: Medicare HMO | Attending: Cardiology | Admitting: Cardiology

## 2022-07-22 ENCOUNTER — Encounter: Payer: Self-pay | Admitting: Cardiology

## 2022-07-22 ENCOUNTER — Telehealth: Payer: Self-pay | Admitting: *Deleted

## 2022-07-22 DIAGNOSIS — I480 Paroxysmal atrial fibrillation: Secondary | ICD-10-CM | POA: Diagnosis not present

## 2022-07-22 DIAGNOSIS — I2510 Atherosclerotic heart disease of native coronary artery without angina pectoris: Secondary | ICD-10-CM

## 2022-07-22 DIAGNOSIS — I77810 Thoracic aortic ectasia: Secondary | ICD-10-CM | POA: Diagnosis not present

## 2022-07-22 DIAGNOSIS — I10 Essential (primary) hypertension: Secondary | ICD-10-CM

## 2022-07-22 DIAGNOSIS — G4733 Obstructive sleep apnea (adult) (pediatric): Secondary | ICD-10-CM

## 2022-07-22 DIAGNOSIS — E7800 Pure hypercholesterolemia, unspecified: Secondary | ICD-10-CM | POA: Diagnosis not present

## 2022-07-22 DIAGNOSIS — I350 Nonrheumatic aortic (valve) stenosis: Secondary | ICD-10-CM

## 2022-07-22 NOTE — Progress Notes (Signed)
Date:  07/22/2022   ID:  Fredderick Erb, DOB [birth year], MRN [id number]  PCP:  Thana Ates, MD  Sleep Medicine:  Armanda Magic, MD Electrophysiologist:  None   Chief Complaint:  OSA, HTN, CAD  History of Present Illness:    Randall Chen is a 89 y.o. male with a hx of  OSA on CPAP, HTN, ASCAD s/p inferior MI 2003 with PCI of RCA 6/03 and then PCI of left circ/OM 2/05, dyslipidemia, bicuspid AV with mild AS, mildly dilated aorta and PAF followed by Dr. Ladona Ridgel.  He had a reoccurrence of PAF in Feb 2018 and underwent successful DCCV to NSR.  He was seen in the afib clinic 10/14/2016 with complaints of palpitations for 1 week after a gambling weekend in [place] where he drank more ETOH than usual.  Due to subtherapeutic INR, he underwent TEE/DCCV to NSR on 10/30/2016. He was seen back in afib clinic in Feb 2019 with recurrent afib. ADD therapy was discussed with only option being Tikosyn.  He has bradycardia so Sotolol was not an option.  He was subsequently loaded on dofetilide.   He is here today for followup and is doing well.  He denies any chest pain or pressure, SOB, DOE, PND, orthopnea, LE edema, dizziness (except if he is gardening in the heat and stands up too fast), palpitations or syncope. He is compliant with his meds and is tolerating meds with no SE.    He is doing well with his PAP device and thinks that he has gotten used to it.  He tolerates the mask and feels the pressure is adequate.  Since going on PAP he feels rested in the am and has no significant daytime sleepiness.  He denies any significant mouth or nasal dryness or nasal congestion.  He does not think that he snores.    Prior CV studies:   The following studies were reviewed today:  PAP compliance download  Past Medical History:  Diagnosis Date   Aortic stenosis    mild by echo 09/2017   BCC (basal cell carcinoma of skin) 05/19/2001   Pigmented BCC Left Outer mouth   BCC (basal cell carcinoma of skin)  06/22/2008   BCC Upper Left Back   BCC (basal cell carcinoma) 11/04/2012   BCC Superficial Mid Forehead    BCC (basal cell carcinoma) 12/02/2013   BCC Mid Forehead   Coronary disease    2 Vessel,inferior wall MI june 2003, PTCA and Stent RCA June 2003, PTCA stent, Left Circumflex OM 02 Mar 2003, Dr Mayford Knife   Dilated aortic root (HCC)    42mm by echo 09/2016   DJD (degenerative joint disease) of knee    LEFT   Dyslipidemia    GERD (gastroesophageal reflux disease)    History of rotator cuff syndrome    (Right) with biceps tendinitis   Hypertension    Melanoma (HCC) 12-30-1934   clark level 2 left post scalp tx with mohs Dr Park Liter   Nodular basal cell carcinoma (BCC) 10/13/2013   Nod BCC Mid Forehead   Nodular basal cell carcinoma (BCC) 11/14/2015   BCC Nod. Mid Forehead   Nodular basal cell carcinoma (BCC) 07/16/2016   Left Forehead Med   Nodular basal cell carcinoma (BCC) 07/16/2016   Left Forehead Lat   Obesity    OSA (obstructive sleep apnea)    presented with HA and drowsiness,mild sleep apnea with oxygen desats to 78%   Persistent atrial fibrillation (HCC)  Prostate cancer (HCC)    /BPH, radioactive seed 2007, [place]    SCC (squamous cell carcinoma) 06/22/2008   SCC In Situ Left Sideburn   SCC (squamous cell carcinoma) 06/22/2008   SCC In Situ Left Preauricular   SCC (squamous cell carcinoma) 06/22/2008   SCC In Situ Below Right Ear   SCC (squamous cell carcinoma) 11/04/2012   SCC In Situ Lower Right Forearm   SCC (squamous cell carcinoma) 02/11/2014   SCC Left Nasal Blub   SCC (squamous cell carcinoma) 11/14/2015   SCC In Situ Left Side Neck   Visit for monitoring Tikosyn therapy 08/12/2017   Past Surgical History:  Procedure Laterality Date   CARDIAC CATHETERIZATION     CARDIOVERSION N/A 08/27/2013   Procedure: CARDIOVERSION;  Surgeon: Quintella Reichert, MD;  Location: [facility] ENDOSCOPY;  Service: Cardiovascular;  Laterality: N/A;   CARDIOVERSION N/A 03/15/2016    Procedure: CARDIOVERSION;  Surgeon: Lars Masson, MD;  Location: [facility] ENDOSCOPY;  Service: Cardiovascular;  Laterality: N/A;   CARDIOVERSION N/A 10/30/2016   Procedure: CARDIOVERSION;  Surgeon: Jake Bathe, MD;  Location: [facility] ENDOSCOPY;  Service: Cardiovascular;  Laterality: N/A;   CORONARY ANGIOPLASTY     JOINT REPLACEMENT     left knee replacement 2009   TEE WITHOUT CARDIOVERSION N/A 10/30/2016   Procedure: TRANSESOPHAGEAL ECHOCARDIOGRAM (TEE);  Surgeon: Jake Bathe, MD;  Location: [facility] ENDOSCOPY;  Service: Cardiovascular;  Laterality: N/A;     Current Meds  Medication Sig   dofetilide (TIKOSYN) 250 MCG capsule TAKE ONE CAPSULE BY MOUTH TWICE A DAY   ELIQUIS 5 MG TABS tablet TAKE 1 TABLET(5 MG) BY MOUTH TWICE DAILY   ezetimibe (ZETIA) 10 MG tablet TAKE 1 TABLET(10 MG) BY MOUTH DAILY   irbesartan (AVAPRO) 300 MG tablet Take 300 mg by mouth daily.   magnesium oxide (MAG-OX) 400 MG tablet Take 1 tablet (400 mg total) by mouth daily.   metoprolol succinate (TOPROL-XL) 25 MG 24 hr tablet TAKE 1 TABLET(25 MG) BY MOUTH EVERY EVENING   nitroGLYCERIN (NITROSTAT) 0.4 MG SL tablet Place 1 tablet (0.4 mg total) under the tongue every 5 (five) minutes as needed for chest pain.   omeprazole (PRILOSEC) 40 MG capsule Take 40 mg by mouth daily as needed (heartburn).   PFIZER COVID-19 VAC BIVALENT injection    rosuvastatin (CRESTOR) 5 MG tablet TAKE 1 TABLET(5 MG) BY MOUTH 3 TIMES A WEEK.   tamsulosin (FLOMAX) 0.4 MG CAPS capsule Take 0.4 mg by mouth daily.   traZODone (DESYREL) 50 MG tablet Take 50 mg by mouth at bedtime as needed.   triamcinolone cream (KENALOG) 0.1 % APPLY TO THE AFFECTED AREA EVERY DAY AS NEEDED   vitamin B-12 (CYANOCOBALAMIN) 100 MCG tablet Take 100 mcg by mouth daily.     Allergies:   Prednisone, Zocor [simvastatin], Yellow jacket venom [bee venom], Hydrocodone, and Oxycodone hcl   Social History   Tobacco Use   Smoking status: Former    Types: Cigarettes    Quit date:  01/28/1958    Years since quitting: 64.5   Smokeless tobacco: Never  Vaping Use   Vaping Use: Never used  Substance Use Topics   Alcohol use: Yes    Comment: "a few a week"   Drug use: No     Family Hx: The patient's family history includes Cancer in his brother; Coronary artery disease in his brother and brother; Diabetes in his mother; Fainting in his brother; Heart attack in his brother and father; Liver cancer in  his brother; Peripheral vascular disease in his father; Sudden death in his brother.  ROS:   Please see the history of present illness.     All other systems reviewed and are negative.   Labs/Other Tests and Data Reviewed:   EKG Interpretation  Date/Time:  Monday July 22 2022 14:45:11 EDT Ventricular Rate:  57 PR Interval:  218 QRS Duration: 98 QT Interval:  446 QTC Calculation: 434 R Axis:   -44 Text Interpretation: Sinus bradycardia with 1st degree A-V block Left axis deviation Possible Lateral infarct , age undetermined When compared with ECG of 21-Aug-2017 09:07, No significant change was found Confirmed by Armanda Magic ([id number]) on 07/22/2022 3:19:26 PM     Recent Labs: No results found for requested labs within last 365 days.   Recent Lipid Panel Lab Results  Component Value Date/Time   CHOL 116 10/15/2019 07:38 AM   CHOL 101 06/17/2013 08:21 AM   TRIG 87 10/15/2019 07:38 AM   TRIG 75 06/17/2013 08:21 AM   HDL 41 10/15/2019 07:38 AM   HDL 42 06/17/2013 08:21 AM   CHOLHDL 2.8 10/15/2019 07:38 AM   CHOLHDL 2.7 09/18/2015 09:02 AM   LDLCALC 58 10/15/2019 07:38 AM   LDLCALC 44 06/17/2013 08:21 AM    Wt Readings from Last 3 Encounters:  07/22/22 247 lb 3.2 oz (112.1 kg)  07/18/21 248 lb 6.4 oz (112.7 kg)  05/29/21 242 lb (109.8 kg)     Objective:    Vital Signs:  BP (!) 142/78   Pulse (!) 57   Ht 6\' 1"  (1.854 m)   Wt 247 lb 3.2 oz (112.1 kg)   SpO2 96%   BMI 32.61 kg/m   GEN: Well nourished, well developed in no acute distress HEENT:  Normal NECK: No JVD; No carotid bruits LYMPHATICS: No lymphadenopathy CARDIAC:RRR, no  rubs, gallops 2/6 SM at RUSB to LLSB RESPIRATORY:  Clear to auscultation without rales, wheezing or rhonchi  ABDOMEN: Soft, non-tender, non-distended MUSCULOSKELETAL:  No edema; No deformity  SKIN: Warm and dry NEUROLOGIC:  Alert and oriented x 3 PSYCHIATRIC:  Normal affect  ASSESSMENT & PLAN:    1.  OSA - The patient is tolerating PAP therapy well without any problems. The PAP download performed by his DME was personally reviewed and interpreted by me today and showed an AHI of 7/hr on 7 cm H2O with 100 % compliance in using more than 4 hours nightly.  The patient has been using and benefiting from PAP use and will continue to benefit from therapy.  -I will increase his CPAP to 8 cm H2O and repeat a download in 4 weeks to see if we can get his AHI below 5  2.  HTN -His BP goal is < 130/62mmHg -BP is adequately controlled on exam -Continue prescription drug management with irbesartan 300 mg every morning and Toprol XL 25 mg every afternoon with as needed refills   3.  ASCAD  -s/p inferior MI 2003 with PCI of RCA 6/03 and then PCI of left circ/OM 2/05.   -He has not had any anginal chest pain or shortness of breath since I saw him last -Continue prescription drug manage meant with Crestor 5 mg daily, Toprol-XL 25 mg daily with as needed refills -No ASA due to DOAC   4.  Mild AS  - stable by echo 09/2018 -echo 9/22 showed no AS with mean AVG -repeat 2D echo for AS   5.  Hyperlipidemia  -LDL goal is less than  70.   -I have personally reviewed and interpreted outside labs performed by patient's PCP which showed LDL 48 and HDL 41 on 07/04/2022  -Continue prescription drug management with Zetia 10 mg daily and Crestor 5 mg daily with as needed refills  6.  Paroxysmal atrial fibrillation  -He is maintaining normal sinus rhythm and denies any palpitations -Followed by EP>>QTC on EKG today is  -Continue prescription management with Tikosyn 250 mcg twice daily, Toprol-XL 25 mg daily, Eliquis 5 mg twice daily with as needed refills -I have personally reviewed and interpreted outside labs performed by patient's PCP which showed serum creatinine 1.46, potassium 4.9 on 07/04/2022   7. Dilated aortic root  -Echo 09/2019  Aortic root increased from 38 to 42mm -Continue statin and BP control -Chest CTA 3/22 showed normal dimensions  Medication Adjustments/Labs and Tests Ordered: Current medicines are reviewed at length with the patient today.  Concerns regarding medicines are outlined above.  Tests Ordered: Orders Placed This Encounter  Procedures   EKG 12-Lead   Medication Changes: No orders of the defined types were placed in this encounter.   Disposition:  Follow up in 1 year(s)  Signed, Armanda Magic, MD  07/22/2022 3:25 PM    [facility]

## 2022-07-22 NOTE — Telephone Encounter (Signed)
Order placed to [facility] via community message. 

## 2022-07-22 NOTE — Patient Instructions (Signed)
Medication Instructions:  Your physician recommends that you continue on your current medications as directed. Please refer to the Current Medication list given to you today.  *If you need a refill on your cardiac medications before your next appointment, please call your pharmacy*   Lab Work: None.  If you have labs (blood work) drawn today and your tests are completely normal, you will receive your results only by: MyChart Message (if you have MyChart) OR A paper copy in the mail If you have any lab test that is abnormal or we need to change your treatment, we will call you to review the results.   Testing/Procedures: Your physician has requested that you have an echocardiogram. Echocardiography is a painless test that uses sound waves to create images of your heart. It provides your doctor with information about the size and shape of your heart and how well your heart's chambers and valves are working. This procedure takes approximately one hour. There are no restrictions for this procedure. Please do NOT wear cologne, perfume, aftershave, or lotions (deodorant is allowed). Please arrive 15 minutes prior to your appointment time.    Follow-Up: At [facility], you and your health needs are our priority.  As part of our continuing mission to provide you with exceptional heart care, we have created designated Provider Care Teams.  These Care Teams include your primary Cardiologist (physician) and Advanced Practice Providers (APPs -  Physician Assistants and Nurse Practitioners) who all work together to provide you with the care you need, when you need it.  We recommend signing up for the patient portal called "MyChart".  Sign up information is provided on this After Visit Summary.  MyChart is used to connect with patients for Virtual Visits (Telemedicine).  Patients are able to view lab/test results, encounter notes, upcoming appointments, etc.  Non-urgent messages can be sent to  your provider as well.   To learn more about what you can do with MyChart, go to [email or web address].    Your next appointment:   1 year(s)  Provider:   Armanda Magic, MD     Other Instructions Dr. Mayford Knife has changed your cpap settings to 8 cm H20. Your DME company may contact you in regards to this change.

## 2022-07-22 NOTE — Telephone Encounter (Signed)
-----   Message from Luellen Pucker, RN sent at 07/22/2022  3:42 PM EDT ----- Regarding: cpap settings Dr. Mayford Knife would like to change his cpap settings to 8 cm H20.  Thanks, Alcario Drought

## 2022-07-22 NOTE — Addendum Note (Signed)
Addended by: Luellen Pucker on: 07/22/2022 03:42 PM   Modules accepted: Orders

## 2022-07-25 ENCOUNTER — Ambulatory Visit
Admission: RE | Admit: 2022-07-25 | Discharge: 2022-07-25 | Disposition: A | Discharge status: Home / Self Care | Payer: Medicare HMO | Source: Ambulatory Visit | Attending: Internal Medicine | Admitting: Internal Medicine

## 2022-07-25 DIAGNOSIS — I700 Atherosclerosis of aorta: Secondary | ICD-10-CM | POA: Diagnosis not present

## 2022-07-25 DIAGNOSIS — R911 Solitary pulmonary nodule: Secondary | ICD-10-CM

## 2022-07-26 ENCOUNTER — Encounter: Payer: Self-pay | Admitting: Cardiology

## 2022-07-26 MED ORDER — NITROGLYCERIN 0.4 MG SL SUBL: 0.4000 mg | SUBLINGUAL_TABLET | SUBLINGUAL | 3 refills | Status: AC | PRN

## 2022-07-31 ENCOUNTER — Encounter: Payer: Self-pay | Admitting: Cardiology

## 2022-08-05 ENCOUNTER — Other Ambulatory Visit: Payer: Self-pay | Admitting: Cardiology

## 2022-08-05 MED ORDER — DOFETILIDE 250 MCG PO CAPS: ORAL_CAPSULE | ORAL | 0 refills | Status: DC

## 2022-08-06 DIAGNOSIS — M1711 Unilateral primary osteoarthritis, right knee: Secondary | ICD-10-CM | POA: Diagnosis not present

## 2022-08-29 DIAGNOSIS — D649 Anemia, unspecified: Secondary | ICD-10-CM | POA: Diagnosis not present

## 2022-09-02 ENCOUNTER — Other Ambulatory Visit: Payer: Self-pay | Admitting: Internal Medicine

## 2022-09-03 ENCOUNTER — Other Ambulatory Visit: Payer: Self-pay

## 2022-09-03 DIAGNOSIS — I4819 Other persistent atrial fibrillation: Secondary | ICD-10-CM

## 2022-09-03 MED ORDER — APIXABAN 5 MG PO TABS: 5.0000 mg | ORAL_TABLET | Freq: Two times a day (BID) | ORAL | 1 refills | Status: DC

## 2022-09-03 NOTE — Telephone Encounter (Signed)
Prescription refill request for Eliquis received. Indication:afib Last office visit:6/24 Scr:1.4  6/24 Age: 89 Weight:112.1  kg  Prescription refilled

## 2022-09-04 ENCOUNTER — Other Ambulatory Visit: Payer: Self-pay | Admitting: Internal Medicine

## 2022-09-16 ENCOUNTER — Other Ambulatory Visit: Payer: Self-pay | Admitting: Internal Medicine

## 2022-09-17 ENCOUNTER — Encounter: Payer: Self-pay | Admitting: Podiatry

## 2022-09-17 ENCOUNTER — Ambulatory Visit: Payer: Medicare HMO | Admitting: Podiatry

## 2022-09-17 DIAGNOSIS — L600 Ingrowing nail: Secondary | ICD-10-CM | POA: Diagnosis not present

## 2022-09-17 NOTE — Patient Instructions (Signed)

## 2022-09-17 NOTE — Progress Notes (Signed)
    Subjective:  Patient ID: Randall Chen, male    DOB: 20-Feb-1932,  MRN: [id number]  Fredderick Erb presents to clinic today for:  Chief Complaint  Patient presents with   Nail Problem    Room 19: Patient is here for nail fungus bilateral   Patient notes he has a chronic ingrown nail of the right great toe along the medial border.  States this has been an ongoing issue for him.  He sometimes gets pedicures at the nail salon and occasionally they will cut the area back to give him some temporary relief.  He denies any recent pus drainage from the area.  He is also interested in getting on a regular schedule with [place] for at risk footcare.  He does take long-term anticoagulant therapy.  PCP is Thana Ates, MD.  Allergies  Allergen Reactions   Prednisone Other (See Comments)    anxiety   Zocor [Simvastatin] Other (See Comments)    Muscle aches   Yellow Jacket Venom [Bee Venom] Swelling    Swelling and raised bumps at site   Hydrocodone Rash   Oxycodone Hcl Rash   Review of Systems: Negative except as noted in the HPI.  Objective:  Randall Chen is a pleasant 89 y.o. male in NAD. AAO x 3.  Vascular Examination: Capillary refill time is 3-5 seconds to toes bilateral. Palpable pedal pulses b/l LE. Digital hair sparse b/l. No ankle edema b/l. Skin temperature gradient WNL b/l.   Dermatological Examination: There is incurvation of the right hallux medial nail border.  There is pain on palpation of the affected nail border.  There is localized edema but no erythema or drainage.  The nail is 4 mm thick with yellow discoloration, subungual debris, and distal onycholysis consistent with onychomycosis.  Neurological Examination: Epicritic sensation intact right foot  Assessment/Plan: 1. Ingrown toenail    Discussed patient's condition today.  After obtaining patient consent, the right hallux was anesthetized with a 50:50 mixture of 1% lidocaine plain and 0.5% bupivacaine plain for  a total of 3cc's administered.  Upon confirmation of anesthesia, a freer elevator was utilized to free the right hallux medial nail border from the nail bed.  The nail border was then avulsed proximal to the eponychium and removed in toto.  The area was inspected for any remaining spicules.  A chemical matrixectomy was performed with phenol and neutralized with alcohol solution.  Antibiotic ointment and a DSD were applied, followed by a Coban dressing.  Patient tolerated the anesthetic and procedure well and will f/u in 2-3 weeks for recheck.  Patient given post-procedure instructions for Epsom salt soaks, antibiotic ointment and daily use of Bandaids until toe starts to dry / form eschar.   Return in about 2 weeks (around 10/01/2022) for PNA recheck & RFC.  Will also discussed treatment of the onychomycosis at that visit as well.  Patient is not interested in oral medication at this time so we briefly discussed topicals and laser options.   Clerance Lav, DPM, FACFAS [facility]     [address] [place], [place] [address]                Office [phone]  Fax [phone]

## 2022-09-22 ENCOUNTER — Other Ambulatory Visit: Payer: Self-pay | Admitting: Internal Medicine

## 2022-09-23 ENCOUNTER — Telehealth: Payer: Self-pay | Admitting: Internal Medicine

## 2022-09-23 MED ORDER — DOFETILIDE 250 MCG PO CAPS: ORAL_CAPSULE | ORAL | 0 refills | Status: DC

## 2022-09-23 NOTE — Telephone Encounter (Signed)
 *  STAT* If patient is at the pharmacy, call can be transferred to refill team.   1. Which medications need to be refilled? (please list name of each medication and dose if known) dofetilide (TIKOSYN) 250 MCG capsule    2. Would you like to learn more about the convenience, safety, & potential cost savings by using the [facility]? No      3. Are you open to using the [facility] (Type [facility]. No    4. Which pharmacy/location (including street and city if local pharmacy) is medication to be sent to? [id number] - [place], [place] - [address] AT [facility] [address]     5. Do they need a 30 day or 90 day supply? 90 days  Pt made an appt with Otilio Saber on 10/17/22 at 8 am. Pt is out of meds and need refills today

## 2022-09-23 NOTE — Telephone Encounter (Signed)
Pt has appointment with Otilio Saber, PA-C, 11/13/22.  30 day refill of Tikosyn has been sent to [facility].

## 2022-10-01 ENCOUNTER — Encounter: Payer: Self-pay | Admitting: Cardiology

## 2022-10-01 ENCOUNTER — Ambulatory Visit (HOSPITAL_COMMUNITY): Payer: Medicare HMO | Attending: Cardiology

## 2022-10-01 DIAGNOSIS — I350 Nonrheumatic aortic (valve) stenosis: Secondary | ICD-10-CM | POA: Diagnosis not present

## 2022-10-01 LAB — ECHOCARDIOGRAM COMPLETE
AR max vel: 1.68 cm2
AV Area VTI: 1.98 cm2
AV Area mean vel: 1.8 cm2
AV Mean grad: 13 mmHg
AV Peak grad: 21.5 mmHg
Ao pk vel: 2.32 m/s
Area-P 1/2: 2.66 cm2
S' Lateral: 2.2 cm

## 2022-10-02 ENCOUNTER — Telehealth: Payer: Self-pay | Admitting: Cardiology

## 2022-10-02 DIAGNOSIS — I350 Nonrheumatic aortic (valve) stenosis: Secondary | ICD-10-CM

## 2022-10-02 NOTE — Telephone Encounter (Signed)
-----   Message from Armanda Magic sent at 10/01/2022  8:13 PM EDT ----- Echo with normal EF 60-65% with mildly thickened heart muscle from HTN, increased stiffness of heart muscle called diastolic dysfunction, mild AS, mildly enlarged aortic root at 40mm >>stable from prior echo>> repeat echo in 1 year for dilated aortic root and mild AS

## 2022-10-02 NOTE — Telephone Encounter (Signed)
Discussed echo results with patient.  Per Dr. Mayford Knife: Echo with normal EF 60-65% with mildly thickened heart muscle from HTN, increased stiffness of heart muscle called diastolic dysfunction, mild AS, mildly enlarged aortic root at 40mm >>stable from prior echo>> repeat echo in 1 year for dilated aortic root and mild AS   Patient verbalized understanding and expressed appreciation for call. Echo ordered for September 2025.

## 2022-10-05 ENCOUNTER — Other Ambulatory Visit: Payer: Self-pay | Admitting: Internal Medicine

## 2022-10-07 MED ORDER — DOFETILIDE 250 MCG PO CAPS: ORAL_CAPSULE | ORAL | 0 refills | Status: DC

## 2022-10-08 ENCOUNTER — Ambulatory Visit: Payer: Medicare HMO | Admitting: Podiatry

## 2022-10-08 DIAGNOSIS — M79675 Pain in left toe(s): Secondary | ICD-10-CM

## 2022-10-08 DIAGNOSIS — B351 Tinea unguium: Secondary | ICD-10-CM | POA: Diagnosis not present

## 2022-10-08 DIAGNOSIS — L600 Ingrowing nail: Secondary | ICD-10-CM | POA: Diagnosis not present

## 2022-10-08 DIAGNOSIS — M79674 Pain in right toe(s): Secondary | ICD-10-CM

## 2022-10-08 NOTE — Progress Notes (Signed)
Subjective:  Patient ID: Randall Chen, male    DOB: 1932/08/11,  MRN: [id number]   Fredderick Erb presents to clinic today for: No chief complaint on file. . Patient notes nails are thick, discolored, elongated and painful in shoegear when trying to ambulate.  He also presents for a PNA recheck of the left hallux medial nail border.  Denies pain.  Has been soaking up through this morning.  Denies current drainage.    PCP is Thana Ates, MD.  Past Medical History:  Diagnosis Date   Aortic stenosis    mild by echo 09/2022 with mean AVG   BCC (basal cell carcinoma of skin) 05/19/2001   Pigmented BCC Left Outer mouth   BCC (basal cell carcinoma of skin) 06/22/2008   BCC Upper Left Back   BCC (basal cell carcinoma) 11/04/2012   BCC Superficial Mid Forehead    BCC (basal cell carcinoma) 12/02/2013   BCC Mid Forehead   Coronary disease    2 Vessel,inferior wall MI june 2003, PTCA and Stent RCA June 2003, PTCA stent, Left Circumflex OM 02 Mar 2003, Dr Mayford Knife   Dilated aortic root (HCC)    40mm by echo 09/2022   DJD (degenerative joint disease) of knee    LEFT   Dyslipidemia    GERD (gastroesophageal reflux disease)    History of rotator cuff syndrome    (Right) with biceps tendinitis   Hypertension    Melanoma (HCC) 03-Mar-1934   clark level 2 left post scalp tx with mohs Dr Park Liter   Nodular basal cell carcinoma (BCC) 10/13/2013   Nod BCC Mid Forehead   Nodular basal cell carcinoma (BCC) 11/14/2015   BCC Nod. Mid Forehead   Nodular basal cell carcinoma (BCC) 07/16/2016   Left Forehead Med   Nodular basal cell carcinoma (BCC) 07/16/2016   Left Forehead Lat   Obesity    OSA (obstructive sleep apnea)    presented with HA and drowsiness,mild sleep apnea with oxygen desats to 78%   Persistent atrial fibrillation (HCC)    Prostate cancer (HCC)    /BPH, radioactive seed 2007, Nesi    SCC (squamous cell carcinoma) 06/22/2008   SCC In Situ Left Sideburn   SCC  (squamous cell carcinoma) 06/22/2008   SCC In Situ Left Preauricular   SCC (squamous cell carcinoma) 06/22/2008   SCC In Situ Below Right Ear   SCC (squamous cell carcinoma) 11/04/2012   SCC In Situ Lower Right Forearm   SCC (squamous cell carcinoma) 02/11/2014   SCC Left Nasal Blub   SCC (squamous cell carcinoma) 11/14/2015   SCC In Situ Left Side Neck   Visit for monitoring Tikosyn therapy 08/12/2017    Past Surgical History:  Procedure Laterality Date   CARDIAC CATHETERIZATION     CARDIOVERSION N/A 08/27/2013   Procedure: CARDIOVERSION;  Surgeon: Quintella Reichert, MD;  Location: [facility] ENDOSCOPY;  Service: Cardiovascular;  Laterality: N/A;   CARDIOVERSION N/A 03/15/2016   Procedure: CARDIOVERSION;  Surgeon: Lars Masson, MD;  Location: [facility] ENDOSCOPY;  Service: Cardiovascular;  Laterality: N/A;   CARDIOVERSION N/A 10/30/2016   Procedure: CARDIOVERSION;  Surgeon: Jake Bathe, MD;  Location: [facility] ENDOSCOPY;  Service: Cardiovascular;  Laterality: N/A;   CORONARY ANGIOPLASTY     JOINT REPLACEMENT     left knee replacement 2009   TEE WITHOUT CARDIOVERSION N/A 10/30/2016   Procedure: TRANSESOPHAGEAL ECHOCARDIOGRAM (TEE);  Surgeon: Jake Bathe, MD;  Location: [facility] ENDOSCOPY;  Service:  Cardiovascular;  Laterality: N/A;    Allergies  Allergen Reactions   Prednisone Other (See Comments)    anxiety   Zocor [Simvastatin] Other (See Comments)    Muscle aches   Yellow Jacket Venom [Bee Venom] Swelling    Swelling and raised bumps at site   Hydrocodone Rash   Oxycodone Hcl Rash    Review of Systems: Negative except as noted in the HPI.  Objective:  There were no vitals filed for this visit.  Randall Chen is a pleasant 89 y.o. male in NAD. AAO x 3.  Vascular Examination: Capillary refill time is 3-5 seconds to toes bilateral. Palpable pedal pulses b/l LE. Digital hair present b/l.  Skin temperature gradient WNL b/l. No varicosities b/l. No cyanosis noted b/l.   Dermatological  Examination: Pedal skin with normal turgor, texture and tone b/l. No open wounds. No interdigital macerations b/l. Toenails x10 are 3mm thick, discolored, dystrophic with subungual debris. There is pain with compression of the nail plates.  They are elongated x10.  The PNA site at the right hallux medial nail border shows no clinical signs of infection.  There is no active drainage.  There is slight hyperpigmentation but no erythema or calor noted.  No pain on palpation.  The area is dry  Assessment/Plan: 1. Pain due to onychomycosis of toenails of both feet   2. Ingrown toenail    The mycotic toenails were sharply debrided x10 with sterile nail nippers and a power debriding burr to decrease bulk/thickness and length.    Will follow-up in approximately 3 months for at risk footcare but will continue to get pedicures approximately every 6 weeks between his scheduled visits here.  He was informed he does a great job moisturizing his feet daily and his skin is in very good condition.  He stays active and this is good for circulation to the lower extremity.  Return in about 3 months (around 01/07/2023) for RFC.   Clerance Lav, DPM, FACFAS [facility]     [address] [place], [place] [address]                Office [phone]  Fax [phone]

## 2022-10-16 NOTE — Progress Notes (Unsigned)
Electrophysiology Office Note:   Date:  10/17/2022  ID:  Randall Chen, DOB October 22, 1932, MRN [id number]  Primary Cardiologist: Armanda Magic, MD Electrophysiologist: Lewayne Bunting, MD      History of Present Illness:   Randall Chen is a 89 y.o. male with h/o PAF, Obesity, SND, and HLD seen today for routine electrophysiology followup.   Since last being seen in our clinic the patient reports doing well overall. Mild lightheadedness with rapid standing, especially when bent down gardening. He denies chest pain, palpitations, dyspnea, PND, orthopnea, nausea, vomiting, dizziness, syncope, edema, weight gain, or early satiety.   Review of systems complete and found to be negative unless listed in HPI.   EP Information / Studies Reviewed:    EKG is not ordered today. EKG from 07/22/22 reviewed which showed sinus bradycardia at 57 bpm         Physical Exam:   VS:  BP 132/66   Pulse (!) 59   Ht 6\' 1"  (1.854 m)   Wt 252 lb (114.3 kg)   SpO2 95%   BMI 33.25 kg/m    Wt Readings from Last 3 Encounters:  10/17/22 252 lb (114.3 kg)  07/22/22 247 lb 3.2 oz (112.1 kg)  07/18/21 248 lb 6.4 oz (112.7 kg)     GEN: Well nourished, well developed in no acute distress NECK: No JVD; No carotid bruits CARDIAC: Regular rate and rhythm, no murmurs, rubs, gallops RESPIRATORY:  Clear to auscultation without rales, wheezing or rhonchi  ABDOMEN: Soft, non-tender, non-distended EXTREMITIES:  No edema; No deformity   ASSESSMENT AND PLAN:    Paroxysmal Atrial Fibrillation  EKG today shows NSR/SB with stable intervals Continue Eliquis 5mg  BID for CHA2DS2VASC of at least 4 Continue Tikosyn 250 mcg BID Continue Toprol 25 mg daily      SND Asymptomatic Follow  HLD Continue statin  Mild AS Mildly enlarged aortic root Echo 10/01/2022 LVEF 60-65%, mild AS, aortic root 40 mm  Follow up with Dr. Ladona Ridgel in 6 months  Signed, Graciella Freer, PA-C

## 2022-10-17 ENCOUNTER — Encounter: Payer: Self-pay | Admitting: Student

## 2022-10-17 ENCOUNTER — Ambulatory Visit: Payer: Medicare HMO | Attending: Student | Admitting: Student

## 2022-10-17 VITALS — BP 132/66 | HR 59 | Ht 73.0 in | Wt 252.0 lb

## 2022-10-17 DIAGNOSIS — I10 Essential (primary) hypertension: Secondary | ICD-10-CM

## 2022-10-17 DIAGNOSIS — I480 Paroxysmal atrial fibrillation: Secondary | ICD-10-CM

## 2022-10-17 DIAGNOSIS — G4733 Obstructive sleep apnea (adult) (pediatric): Secondary | ICD-10-CM

## 2022-10-17 MED ORDER — DOFETILIDE 250 MCG PO CAPS: ORAL_CAPSULE | ORAL | 3 refills | Status: DC

## 2022-10-17 NOTE — Patient Instructions (Signed)
Medication Instructions:  Your physician recommends that you continue on your current medications as directed. Please refer to the Current Medication list given to you today.  *If you need a refill on your cardiac medications before your next appointment, please call your pharmacy*  Lab Work: BMET, MAG--TODAY If you have labs (blood work) drawn today and your tests are completely normal, you will receive your results only by: MyChart Message (if you have MyChart) OR A paper copy in the mail If you have any lab test that is abnormal or we need to change your treatment, we will call you to review the results.  Follow-Up: At [facility], you and your health needs are our priority.  As part of our continuing mission to provide you with exceptional heart care, we have created designated Provider Care Teams.  These Care Teams include your primary Cardiologist (physician) and Advanced Practice Providers (APPs -  Physician Assistants and Nurse Practitioners) who all work together to provide you with the care you need, when you need it.  Your next appointment:   6 month(s)  Provider:   Lewayne Bunting, MD

## 2022-10-18 LAB — BASIC METABOLIC PANEL
BUN/Creatinine Ratio: 23 (ref 10–24)
BUN: 28 mg/dL — ABNORMAL HIGH (ref 8–27)
CO2: 21 mmol/L (ref 20–29)
Calcium: 9.2 mg/dL (ref 8.6–10.2)
Chloride: 112 mmol/L — ABNORMAL HIGH (ref 96–106)
Creatinine, Ser: 1.23 mg/dL (ref 0.76–1.27)
Glucose: 92 mg/dL (ref 70–99)
Potassium: 4.9 mmol/L (ref 3.5–5.2)
Sodium: 147 mmol/L — ABNORMAL HIGH (ref 134–144)
eGFR: 56 mL/min/{1.73_m2} — ABNORMAL LOW (ref >59)

## 2022-10-18 LAB — MAGNESIUM: Magnesium: 1.9 mg/dL (ref 1.6–2.3)

## 2022-11-02 ENCOUNTER — Other Ambulatory Visit: Payer: Self-pay | Admitting: Student

## 2022-11-11 DIAGNOSIS — M1711 Unilateral primary osteoarthritis, right knee: Secondary | ICD-10-CM | POA: Diagnosis not present

## 2022-12-03 ENCOUNTER — Other Ambulatory Visit: Payer: Self-pay | Admitting: Internal Medicine

## 2022-12-10 DIAGNOSIS — Z08 Encounter for follow-up examination after completed treatment for malignant neoplasm: Secondary | ICD-10-CM | POA: Diagnosis not present

## 2022-12-10 DIAGNOSIS — C44619 Basal cell carcinoma of skin of left upper limb, including shoulder: Secondary | ICD-10-CM | POA: Diagnosis not present

## 2022-12-10 DIAGNOSIS — Z85820 Personal history of malignant melanoma of skin: Secondary | ICD-10-CM | POA: Diagnosis not present

## 2022-12-10 DIAGNOSIS — C44319 Basal cell carcinoma of skin of other parts of face: Secondary | ICD-10-CM | POA: Diagnosis not present

## 2022-12-10 DIAGNOSIS — L2989 Other pruritus: Secondary | ICD-10-CM | POA: Diagnosis not present

## 2022-12-10 DIAGNOSIS — L538 Other specified erythematous conditions: Secondary | ICD-10-CM | POA: Diagnosis not present

## 2022-12-10 DIAGNOSIS — L820 Inflamed seborrheic keratosis: Secondary | ICD-10-CM | POA: Diagnosis not present

## 2022-12-10 DIAGNOSIS — L814 Other melanin hyperpigmentation: Secondary | ICD-10-CM | POA: Diagnosis not present

## 2022-12-10 DIAGNOSIS — D225 Melanocytic nevi of trunk: Secondary | ICD-10-CM | POA: Diagnosis not present

## 2022-12-10 DIAGNOSIS — L821 Other seborrheic keratosis: Secondary | ICD-10-CM | POA: Diagnosis not present

## 2022-12-10 DIAGNOSIS — Z789 Other specified health status: Secondary | ICD-10-CM | POA: Diagnosis not present

## 2022-12-31 DIAGNOSIS — N3941 Urge incontinence: Secondary | ICD-10-CM | POA: Diagnosis not present

## 2022-12-31 DIAGNOSIS — K5909 Other constipation: Secondary | ICD-10-CM | POA: Diagnosis not present

## 2022-12-31 DIAGNOSIS — N1831 Chronic kidney disease, stage 3a: Secondary | ICD-10-CM | POA: Diagnosis not present

## 2022-12-31 DIAGNOSIS — M25511 Pain in right shoulder: Secondary | ICD-10-CM | POA: Diagnosis not present

## 2022-12-31 DIAGNOSIS — M1711 Unilateral primary osteoarthritis, right knee: Secondary | ICD-10-CM | POA: Diagnosis not present

## 2022-12-31 DIAGNOSIS — D649 Anemia, unspecified: Secondary | ICD-10-CM | POA: Diagnosis not present

## 2022-12-31 DIAGNOSIS — Z9989 Dependence on other enabling machines and devices: Secondary | ICD-10-CM | POA: Diagnosis not present

## 2022-12-31 DIAGNOSIS — I482 Chronic atrial fibrillation, unspecified: Secondary | ICD-10-CM | POA: Diagnosis not present

## 2022-12-31 DIAGNOSIS — I10 Essential (primary) hypertension: Secondary | ICD-10-CM | POA: Diagnosis not present

## 2023-01-07 ENCOUNTER — Ambulatory Visit: Payer: Medicare HMO | Admitting: Podiatry

## 2023-01-07 ENCOUNTER — Encounter: Payer: Self-pay | Admitting: Podiatry

## 2023-01-07 DIAGNOSIS — M79675 Pain in left toe(s): Secondary | ICD-10-CM | POA: Diagnosis not present

## 2023-01-07 DIAGNOSIS — M7741 Metatarsalgia, right foot: Secondary | ICD-10-CM

## 2023-01-07 DIAGNOSIS — B351 Tinea unguium: Secondary | ICD-10-CM | POA: Diagnosis not present

## 2023-01-07 DIAGNOSIS — M79674 Pain in right toe(s): Secondary | ICD-10-CM

## 2023-01-07 NOTE — Progress Notes (Signed)
Subjective:  Patient ID: Randall Chen, male    DOB: Jun 17, 1932,  MRN: [id number]  Randall Chen presents to clinic today for:  Chief Complaint  Patient presents with   RFC    Nail trim and routine foot care. No concerns.   Patient notes nails are thick, discolored, elongated and painful in shoegear when trying to ambulate.  He also notes some pain along the ball of the right foot, pointing toward the 3rd metatarsal head area, denying injury.  Pain aggravated with walking. Denies stepping on foreign object or any drainage.  PCP is Thana Ates, MD.  Past Medical History:  Diagnosis Date   Aortic stenosis    mild by echo 09/2022 with mean AVG   BCC (basal cell carcinoma of skin) 05/19/2001   Pigmented BCC Left Outer mouth   BCC (basal cell carcinoma of skin) 06/22/2008   BCC Upper Left Back   BCC (basal cell carcinoma) 11/04/2012   BCC Superficial Mid Forehead    BCC (basal cell carcinoma) 12/02/2013   BCC Mid Forehead   Coronary disease    2 Vessel,inferior wall MI june 2003, PTCA and Stent RCA June 2003, PTCA stent, Left Circumflex OM 02 Mar 2003, Dr Mayford Knife   Dilated aortic root (HCC)    40mm by echo 09/2022   DJD (degenerative joint disease) of knee    LEFT   Dyslipidemia    GERD (gastroesophageal reflux disease)    History of rotator cuff syndrome    (Right) with biceps tendinitis   Hypertension    Melanoma (HCC) October [birth year] 1934   clark level 2 left post scalp tx with mohs Dr Park Liter   Nodular basal cell carcinoma (BCC) 10/13/2013   Nod BCC Mid Forehead   Nodular basal cell carcinoma (BCC) 11/14/2015   BCC Nod. Mid Forehead   Nodular basal cell carcinoma (BCC) 07/16/2016   Left Forehead Med   Nodular basal cell carcinoma (BCC) 07/16/2016   Left Forehead Lat   Obesity    OSA (obstructive sleep apnea)    presented with HA and drowsiness,mild sleep apnea with oxygen desats to 78%   Persistent atrial fibrillation (HCC)    Prostate cancer (HCC)    /BPH,  radioactive seed 2007, Nesi    SCC (squamous cell carcinoma) 06/22/2008   SCC In Situ Left Sideburn   SCC (squamous cell carcinoma) 06/22/2008   SCC In Situ Left Preauricular   SCC (squamous cell carcinoma) 06/22/2008   SCC In Situ Below Right Ear   SCC (squamous cell carcinoma) 11/04/2012   SCC In Situ Lower Right Forearm   SCC (squamous cell carcinoma) 02/11/2014   SCC Left Nasal Blub   SCC (squamous cell carcinoma) 11/14/2015   SCC In Situ Left Side Neck   Visit for monitoring Tikosyn therapy 08/12/2017    Past Surgical History:  Procedure Laterality Date   CARDIAC CATHETERIZATION     CARDIOVERSION N/A 08/27/2013   Procedure: CARDIOVERSION;  Surgeon: Quintella Reichert, MD;  Location: [facility] ENDOSCOPY;  Service: Cardiovascular;  Laterality: N/A;   CARDIOVERSION N/A 03/15/2016   Procedure: CARDIOVERSION;  Surgeon: Lars Masson, MD;  Location: [facility] ENDOSCOPY;  Service: Cardiovascular;  Laterality: N/A;   CARDIOVERSION N/A 10/30/2016   Procedure: CARDIOVERSION;  Surgeon: Jake Bathe, MD;  Location: [facility] ENDOSCOPY;  Service: Cardiovascular;  Laterality: N/A;   CORONARY ANGIOPLASTY     JOINT REPLACEMENT     left knee replacement 2009   TEE WITHOUT CARDIOVERSION N/A  10/30/2016   Procedure: TRANSESOPHAGEAL ECHOCARDIOGRAM (TEE);  Surgeon: Jake Bathe, MD;  Location: [facility] ENDOSCOPY;  Service: Cardiovascular;  Laterality: N/A;    Allergies  Allergen Reactions   Prednisone Other (See Comments)    anxiety   Zocor [Simvastatin] Other (See Comments)    Muscle aches   Diphenhydramine     Other Reaction(s): contraindicated w/Tykosin   Yellow Jacket Venom [Bee Venom] Swelling    Swelling and raised bumps at site   Hydrocodone Rash   Oxycodone Hcl Rash    Review of Systems: Negative except as noted in the HPI.  Objective:  [name] is a pleasant 89 y.o. male in NAD. AAO x 3.  Vascular Examination: Capillary refill time is 3-5 seconds to toes bilateral. Palpable pedal pulses b/l  LE. Digital hair present b/l.  Skin temperature gradient WNL b/l. No varicosities b/l. No cyanosis noted b/l.   Dermatological Examination: Pedal skin with normal turgor, texture and tone b/l. No open wounds. No interdigital macerations b/l. Toenails x10 are 3mm thick, discolored, dystrophic with subungual debris. There is pain with compression of the nail plates.  They are elongated x10  Musculoskeletal Examination: Pain on palpation just proximal to right 3rd metatarsal head.  Contracture of right 2nd toe at PIPJ.  No pain on palpation of plantar fascia right foot.  Assessment/Plan: 1. Pain due to onychomycosis of toenails of both feet   2. Metatarsalgia of right foot    The mycotic toenails were sharply debrided x10 with sterile nail nippers and a power debriding burr to decrease bulk/thickness and length.    A felt metatarsal pad was placed underneath his right shoe insole, just proximal to the met. Heads to try and off-load the tender area.  He was instructed to remove this if it aggravates his pain.  If still no improvement, make separate appointment for x-ray and further evaluation.  Return in about 3 months (around 04/07/2023) for RFC.   Clerance Lav, DPM, FACFAS [facility]     [address] [place], [place] [address]                Office [phone]  Fax [phone]

## 2023-01-11 DIAGNOSIS — H532 Diplopia: Secondary | ICD-10-CM | POA: Diagnosis not present

## 2023-01-11 DIAGNOSIS — H35371 Puckering of macula, right eye: Secondary | ICD-10-CM | POA: Diagnosis not present

## 2023-01-11 DIAGNOSIS — H04123 Dry eye syndrome of bilateral lacrimal glands: Secondary | ICD-10-CM | POA: Diagnosis not present

## 2023-01-11 DIAGNOSIS — H35363 Drusen (degenerative) of macula, bilateral: Secondary | ICD-10-CM | POA: Diagnosis not present

## 2023-02-25 DIAGNOSIS — C44319 Basal cell carcinoma of skin of other parts of face: Secondary | ICD-10-CM | POA: Diagnosis not present

## 2023-02-25 DIAGNOSIS — L905 Scar conditions and fibrosis of skin: Secondary | ICD-10-CM | POA: Diagnosis not present

## 2023-02-25 DIAGNOSIS — C44619 Basal cell carcinoma of skin of left upper limb, including shoulder: Secondary | ICD-10-CM | POA: Diagnosis not present

## 2023-03-11 DIAGNOSIS — D485 Neoplasm of uncertain behavior of skin: Secondary | ICD-10-CM | POA: Diagnosis not present

## 2023-03-11 DIAGNOSIS — C44311 Basal cell carcinoma of skin of nose: Secondary | ICD-10-CM | POA: Diagnosis not present

## 2023-03-11 DIAGNOSIS — C44722 Squamous cell carcinoma of skin of right lower limb, including hip: Secondary | ICD-10-CM | POA: Diagnosis not present

## 2023-03-18 DIAGNOSIS — C44311 Basal cell carcinoma of skin of nose: Secondary | ICD-10-CM | POA: Diagnosis not present

## 2023-03-18 DIAGNOSIS — C44722 Squamous cell carcinoma of skin of right lower limb, including hip: Secondary | ICD-10-CM | POA: Diagnosis not present

## 2023-04-03 ENCOUNTER — Encounter: Payer: Self-pay | Admitting: Cardiology

## 2023-04-03 DIAGNOSIS — I10 Essential (primary) hypertension: Secondary | ICD-10-CM

## 2023-04-03 DIAGNOSIS — I480 Paroxysmal atrial fibrillation: Secondary | ICD-10-CM

## 2023-04-03 DIAGNOSIS — I77810 Thoracic aortic ectasia: Secondary | ICD-10-CM

## 2023-04-03 DIAGNOSIS — I350 Nonrheumatic aortic (valve) stenosis: Secondary | ICD-10-CM

## 2023-04-03 DIAGNOSIS — G4733 Obstructive sleep apnea (adult) (pediatric): Secondary | ICD-10-CM

## 2023-04-03 DIAGNOSIS — I2510 Atherosclerotic heart disease of native coronary artery without angina pectoris: Secondary | ICD-10-CM

## 2023-04-08 ENCOUNTER — Encounter: Payer: Self-pay | Admitting: Internal Medicine

## 2023-04-08 ENCOUNTER — Ambulatory Visit: Payer: Medicare HMO | Admitting: Podiatry

## 2023-04-08 ENCOUNTER — Ambulatory Visit: Payer: Medicare HMO | Attending: Internal Medicine | Admitting: Internal Medicine

## 2023-04-08 VITALS — BP 136/74 | HR 55 | Ht 73.0 in | Wt 252.0 lb

## 2023-04-08 DIAGNOSIS — B351 Tinea unguium: Secondary | ICD-10-CM | POA: Diagnosis not present

## 2023-04-08 DIAGNOSIS — M79675 Pain in left toe(s): Secondary | ICD-10-CM | POA: Diagnosis not present

## 2023-04-08 DIAGNOSIS — M79674 Pain in right toe(s): Secondary | ICD-10-CM | POA: Diagnosis not present

## 2023-04-08 DIAGNOSIS — I480 Paroxysmal atrial fibrillation: Secondary | ICD-10-CM | POA: Diagnosis not present

## 2023-04-08 NOTE — Telephone Encounter (Signed)
 Nasal Pillows Mask order sent to [facility] today 04/08/23

## 2023-04-08 NOTE — Patient Instructions (Signed)
 Medication Instructions:  Your physician recommends that you continue on your current medications as directed. Please refer to the Current Medication list given to you today.  *If you need a refill on your cardiac medications before your next appointment, please call your pharmacy*  Lab Work: None ordered.  You may go to any [facility] Location for your lab work:  [facility] - [address] ([facility]) - [address] [phone] [address]  [place] - [address]   [place]  - [address]   [place] - [address] - [address] [employer]  - [address] [place] - [address] ([facility]   If you have labs (blood work) drawn today and your tests are completely normal, you will receive your results only by: [employer] (if you have MyChart)  If you have any lab test that is abnormal or we need to change your treatment, we will call you or send a MyChart message to review the results.  Testing/Procedures: None ordered.  Follow-Up: At [facility], you and your health needs are our priority.  As part of our continuing mission to provide you with exceptional heart care, we have created designated Provider Care Teams.  These Care Teams include your primary Cardiologist (physician) and Advanced Practice Providers (APPs -  Physician Assistants and Nurse Practitioners) who all work together to provide you with the care you need, when you need it.  Your next appointment:   1 year(s)  The format for your next appointment:   In Person  Provider:   Lewayne Bunting, MD{or one of the following Advanced Practice Providers on your designated Care Team:   Francis Dowse, [place] Casimiro Needle "Mardelle Matte" [place], [place] Earnest Rosier, NP  Note: Remote monitoring is used to monitor your Pacemaker/ ICD from home. This monitoring reduces the number of office visits required to check your  device to one time per year. It allows [place] to keep an eye on the functioning of your device to ensure it is working properly.            Valet parking services will be available as well.

## 2023-04-08 NOTE — Progress Notes (Signed)
 Subjective:  Patient ID: Randall Chen, male    DOB: 02/10/1932,  MRN: [id number]  Patient notes nails are thick, discolored, elongated and painful in shoegear when trying to ambulate.    PCP is Thana Ates, MD.  Past Medical History:  Diagnosis Date   Aortic stenosis    mild by echo 09/2022 with mean AVG   BCC (basal cell carcinoma of skin) 05/19/2001   Pigmented BCC Left Outer mouth   BCC (basal cell carcinoma of skin) 06/22/2008   BCC Upper Left Back   BCC (basal cell carcinoma) 11/04/2012   BCC Superficial Mid Forehead    BCC (basal cell carcinoma) 12/02/2013   BCC Mid Forehead   Coronary disease    2 Vessel,inferior wall MI june 2003, PTCA and Stent RCA June 2003, PTCA stent, Left Circumflex OM 02 Mar 2003, Dr Mayford Knife   Dilated aortic root (HCC)    40mm by echo 09/2022   DJD (degenerative joint disease) of knee    LEFT   Dyslipidemia    GERD (gastroesophageal reflux disease)    History of rotator cuff syndrome    (Right) with biceps tendinitis   Hypertension    Melanoma (HCC) 10-22-34   clark level 2 left post scalp tx with mohs Dr Park Liter   Nodular basal cell carcinoma (BCC) 10/13/2013   Nod BCC Mid Forehead   Nodular basal cell carcinoma (BCC) 11/14/2015   BCC Nod. Mid Forehead   Nodular basal cell carcinoma (BCC) 07/16/2016   Left Forehead Med   Nodular basal cell carcinoma (BCC) 07/16/2016   Left Forehead Lat   Obesity    OSA (obstructive sleep apnea)    presented with HA and drowsiness,mild sleep apnea with oxygen desats to 78%   Persistent atrial fibrillation (HCC)    Prostate cancer (HCC)    /BPH, radioactive seed 2007, Nesi    SCC (squamous cell carcinoma) 06/22/2008   SCC In Situ Left Sideburn   SCC (squamous cell carcinoma) 06/22/2008   SCC In Situ Left Preauricular   SCC (squamous cell carcinoma) 06/22/2008   SCC In Situ Below Right Ear   SCC (squamous cell carcinoma) 11/04/2012   SCC In Situ Lower Right Forearm   SCC (squamous  cell carcinoma) 02/11/2014   SCC Left Nasal Blub   SCC (squamous cell carcinoma) 11/14/2015   SCC In Situ Left Side Neck   Visit for monitoring Tikosyn therapy 08/12/2017    Past Surgical History:  Procedure Laterality Date   CARDIAC CATHETERIZATION     CARDIOVERSION N/A 08/27/2013   Procedure: CARDIOVERSION;  Surgeon: Quintella Reichert, MD;  Location: [facility] ENDOSCOPY;  Service: Cardiovascular;  Laterality: N/A;   CARDIOVERSION N/A 03/15/2016   Procedure: CARDIOVERSION;  Surgeon: Lars Masson, MD;  Location: [facility] ENDOSCOPY;  Service: Cardiovascular;  Laterality: N/A;   CARDIOVERSION N/A 10/30/2016   Procedure: CARDIOVERSION;  Surgeon: Jake Bathe, MD;  Location: [facility] ENDOSCOPY;  Service: Cardiovascular;  Laterality: N/A;   CORONARY ANGIOPLASTY     JOINT REPLACEMENT     left knee replacement 2009   TEE WITHOUT CARDIOVERSION N/A 10/30/2016   Procedure: TRANSESOPHAGEAL ECHOCARDIOGRAM (TEE);  Surgeon: Jake Bathe, MD;  Location: [facility] ENDOSCOPY;  Service: Cardiovascular;  Laterality: N/A;    Allergies  Allergen Reactions   Prednisone Other (See Comments)    anxiety   Zocor [Simvastatin] Other (See Comments)    Muscle aches   Diphenhydramine     Other Reaction(s): contraindicated w/Tykosin  Yellow Jacket Venom [Bee Venom] Swelling    Swelling and raised bumps at site   Hydrocodone Rash   Oxycodone Hcl Rash    Review of Systems: Negative except as noted in the HPI.  Objective:  Randall Chen is a pleasant 89 y.o. male in NAD. AAO x 3.  Vascular Examination: Capillary refill time is 3-5 seconds to toes bilateral. Palpable pedal pulses b/l LE. Digital hair present b/l.  Skin temperature gradient WNL b/l. No varicosities b/l. No cyanosis noted b/l.   Dermatological Examination: Pedal skin with normal turgor, texture and tone b/l. No open wounds. No interdigital macerations b/l. Toenails x10 are 3mm thick, discolored, dystrophic with subungual debris. There is pain with compression  of the nail plates.  They are elongated x10  Assessment/Plan: 1. Pain due to onychomycosis of toenails of both feet    The mycotic toenails were sharply debrided x10 with sterile nail nippers and a power debriding burr to decrease bulk/thickness and length.    Patient was first to follow-up on an as-needed basis.  States that he feels that he might be clogging up someone else is appointment option that might really need footcare.  He will follow-up as needed.   Clerance Lav, DPM, FACFAS [facility]     [address] [place], [place] [address]                Office [phone]  Fax [phone]

## 2023-04-08 NOTE — Progress Notes (Signed)
 HPI [name] returns today for followup of his atrial fib. He had done well with no symptoms on dofetilide since he cut back on the red wine. He denies medical non-compliance. He remains active though but he has gained  weight since his last visit. No sob or chest pain or edema.  Allergies  Allergen Reactions   Prednisone Other (See Comments)    anxiety   Zocor [Simvastatin] Other (See Comments)    Muscle aches   Diphenhydramine     Other Reaction(s): contraindicated w/Tykosin   Yellow Jacket Venom [Bee Venom] Swelling    Swelling and raised bumps at site   Hydrocodone Rash   Oxycodone Hcl Rash     Current Outpatient Medications  Medication Sig Dispense Refill   apixaban (ELIQUIS) 5 MG TABS tablet Take 1 tablet (5 mg total) by mouth 2 (two) times daily. 180 tablet 1   dofetilide (TIKOSYN) 250 MCG capsule TAKE 1 CAPSULE BY MOUTH TWICE DAILY 180 capsule 3   ezetimibe (ZETIA) 10 MG tablet TAKE 1 TABLET(10 MG) BY MOUTH DAILY 90 tablet 3   fluticasone (FLONASE) 50 MCG/ACT nasal spray Place 1 spray into both nostrils daily as needed for allergies or rhinitis.     irbesartan (AVAPRO) 300 MG tablet Take 300 mg by mouth daily.     magnesium oxide (MAG-OX) 400 MG tablet Take 1 tablet (400 mg total) by mouth daily. 90 tablet 1   metoprolol succinate (TOPROL-XL) 25 MG 24 hr tablet Take 1 tablet (25 mg total) by mouth daily. 90 tablet 3   nitroGLYCERIN (NITROSTAT) 0.4 MG SL tablet Place 1 tablet (0.4 mg total) under the tongue every 5 (five) minutes as needed for chest pain. 25 tablet 3   omeprazole (PRILOSEC) 40 MG capsule Take 40 mg by mouth daily as needed (heartburn).     PFIZER COVID-19 VAC BIVALENT injection      rosuvastatin (CRESTOR) 5 MG tablet TAKE 1 TABLET(5 MG) BY MOUTH 3 TIMES A WEEK 108 tablet 3   tamsulosin (FLOMAX) 0.4 MG CAPS capsule Take 0.4 mg by mouth daily.     traZODone (DESYREL) 50 MG tablet Take 50 mg by mouth at bedtime as needed.     triamcinolone cream  (KENALOG) 0.1 % APPLY TO THE AFFECTED AREA EVERY DAY AS NEEDED 454 g 1   vitamin B-12 (CYANOCOBALAMIN) 100 MCG tablet Take 100 mcg by mouth daily.     No current facility-administered medications for this visit.   Facility-Administered Medications Ordered in Other Visits  Medication Dose Route Frequency Provider Last Rate Last Admin   0.9 %  sodium chloride infusion    Continuous PRN Demetrio Lapping, CRNA   New Bag at 07/19/13 1610     Past Medical History:  Diagnosis Date   Aortic stenosis    mild by echo 09/2022 with mean AVG   BCC (basal cell carcinoma of skin) 05/19/2001   Pigmented BCC Left Outer mouth   BCC (basal cell carcinoma of skin) 06/22/2008   BCC Upper Left Back   BCC (basal cell carcinoma) 11/04/2012   BCC Superficial Mid Forehead    BCC (basal cell carcinoma) 12/02/2013   BCC Mid Forehead   Coronary disease    2 Vessel,inferior wall MI june 2003, PTCA and Stent RCA June 2003, PTCA stent, Left Circumflex OM 02 Mar 2003, Dr Mayford Knife   Dilated aortic root (HCC)    40mm by echo 09/2022   DJD (degenerative joint disease) of knee  LEFT   Dyslipidemia    GERD (gastroesophageal reflux disease)    History of rotator cuff syndrome    (Right) with biceps tendinitis   Hypertension    Melanoma (HCC) [birth year]-1934   clark level 2 left post scalp tx with mohs Dr Park Liter   Nodular basal cell carcinoma (BCC) 10/13/2013   Nod BCC Mid Forehead   Nodular basal cell carcinoma (BCC) 11/14/2015   BCC Nod. Mid Forehead   Nodular basal cell carcinoma (BCC) 07/16/2016   Left Forehead Med   Nodular basal cell carcinoma (BCC) 07/16/2016   Left Forehead Lat   Obesity    OSA (obstructive sleep apnea)    presented with HA and drowsiness,mild sleep apnea with oxygen desats to 78%   Persistent atrial fibrillation (HCC)    Prostate cancer (HCC)    /BPH, radioactive seed 2007, Nesi    SCC (squamous cell carcinoma) 06/22/2008   SCC In Situ Left Sideburn   SCC (squamous cell  carcinoma) 06/22/2008   SCC In Situ Left Preauricular   SCC (squamous cell carcinoma) 06/22/2008   SCC In Situ Below Right Ear   SCC (squamous cell carcinoma) 11/04/2012   SCC In Situ Lower Right Forearm   SCC (squamous cell carcinoma) 02/11/2014   SCC Left Nasal Blub   SCC (squamous cell carcinoma) 11/14/2015   SCC In Situ Left Side Neck   Visit for monitoring Tikosyn therapy 08/12/2017    ROS:   All systems reviewed and negative except as noted in the HPI.   Past Surgical History:  Procedure Laterality Date   CARDIAC CATHETERIZATION     CARDIOVERSION N/A 08/27/2013   Procedure: CARDIOVERSION;  Surgeon: Quintella Reichert, MD;  Location: [facility] ENDOSCOPY;  Service: Cardiovascular;  Laterality: N/A;   CARDIOVERSION N/A 03/15/2016   Procedure: CARDIOVERSION;  Surgeon: Lars Masson, MD;  Location: [facility] ENDOSCOPY;  Service: Cardiovascular;  Laterality: N/A;   CARDIOVERSION N/A 10/30/2016   Procedure: CARDIOVERSION;  Surgeon: Jake Bathe, MD;  Location: [facility] ENDOSCOPY;  Service: Cardiovascular;  Laterality: N/A;   CORONARY ANGIOPLASTY     JOINT REPLACEMENT     left knee replacement 2009   TEE WITHOUT CARDIOVERSION N/A 10/30/2016   Procedure: TRANSESOPHAGEAL ECHOCARDIOGRAM (TEE);  Surgeon: Jake Bathe, MD;  Location: [facility] ENDOSCOPY;  Service: Cardiovascular;  Laterality: N/A;     Family History  Problem Relation Age of Onset   Diabetes Mother    Peripheral vascular disease Father    Heart attack Father    Coronary artery disease Brother    Liver cancer Brother    Coronary artery disease Brother    Heart attack Brother    Cancer Brother    Sudden death Brother    Fainting Brother      Social History   Socioeconomic History   Marital status: Married    Spouse name: Not on file   Number of children: Not on file   Years of education: Not on file   Highest education level: Not on file  Occupational History   Not on file  Tobacco Use   Smoking status: Former    Current  packs/day: 0.00    Types: Cigarettes    Quit date: 01/28/1958    Years since quitting: 65.2   Smokeless tobacco: Never  Vaping Use   Vaping status: Never Used  Substance and Sexual Activity   Alcohol use: Yes    Comment: "a few a week"   Drug use: No   Sexual activity: Not on  file  Other Topics Concern   Not on file  Social History Narrative   Not on file   Social Drivers of Health   Financial Resource Strain: Not on file  Food Insecurity: Not on file  Transportation Needs: Not on file  Physical Activity: Not on file  Stress: Not on file  Social Connections: Not on file  Intimate Partner Violence: Not on file     BP 136/74   Pulse (!) 55   Ht 6\' 1"  (1.854 m)   Wt 252 lb (114.3 kg)   SpO2 95%   BMI 33.25 kg/m   Physical Exam:  Elderly but well appearing NAD HEENT: Unremarkable Neck:  No JVD, no thyromegally Lymphatics:  No adenopathy Back:  No CVA tenderness Lungs:  Clear HEART:  Regular rate rhythm, no murmurs, no rubs, no clicks Abd:  soft, positive bowel sounds, no organomegally, no rebound, no guarding Ext:  2 plus pulses, no edema, no cyanosis, no clubbing Skin:  No rashes no nodules Neuro:  CN II through XII intact, motor grossly intact  EKG - nsr  DEVICE  Normal device function.  See PaceArt for details.   Assess/Plan:  PAF - he continues to maintain NSR very nicely. He will continue his current meds. Obesity - he is encouraged to lose weight with goal to get his bmi down to under 30. He has some difficulty with exercise due to spinal stenosis and osteoarthritis. Sinus node dysfunction - he is asymptomatic with no indication for PPM insertion. Dyslipidemia - he will continue crestor.   Randall Gowda Dejanay Wamboldt,MD

## 2023-04-11 NOTE — Telephone Encounter (Signed)
 Pt called in to inform that he is upset nobody responded to his message. He states "this is bad service, I expect some type of response so I know what's going on".   I apologized and told him that they sent an order for Nasal Pillow Mask to [facility] on 04/08/23 and to reach out if any other questions or concerns.

## 2023-04-14 DIAGNOSIS — M1711 Unilateral primary osteoarthritis, right knee: Secondary | ICD-10-CM | POA: Diagnosis not present

## 2023-04-14 DIAGNOSIS — Z48817 Encounter for surgical aftercare following surgery on the skin and subcutaneous tissue: Secondary | ICD-10-CM | POA: Diagnosis not present

## 2023-04-14 DIAGNOSIS — C44311 Basal cell carcinoma of skin of nose: Secondary | ICD-10-CM | POA: Diagnosis not present

## 2023-04-16 NOTE — Telephone Encounter (Addendum)
 Spoke with patient. Apologized for his experience and for the delay in our initial responses.   Pt has an appointment with [facility] next Tuesday to pick up a new mask. Explained that I will reach out to [facility] representatives to confirm that they are in-network with his insurance. Advised pt that I will reach back out to him by the end of the week. Pt verbalizes understanding and agreement with plan.

## 2023-04-18 NOTE — Telephone Encounter (Signed)
 Spoke with patient to advise that [facility] representative has requested an insurance review to determine if [facility] is in-network. Advised pt that I will call him back on Monday with an update. Pt verbalizes understanding and appreciation of call.

## 2023-04-22 NOTE — Telephone Encounter (Signed)
 Per Nida Boatman with [facility], patient's insurance is in-network and he should keep his appointment today. Pt made aware and is agreeable to plan. He denies questions at this time and is aware to call back with any new concerns.

## 2023-04-25 DIAGNOSIS — G629 Polyneuropathy, unspecified: Secondary | ICD-10-CM | POA: Diagnosis not present

## 2023-05-21 ENCOUNTER — Encounter: Payer: Self-pay | Admitting: Cardiology

## 2023-06-02 MED ORDER — METOPROLOL SUCCINATE ER 25 MG PO TB24: 12.5000 mg | ORAL_TABLET | Freq: Every day | ORAL | 3 refills | Status: DC

## 2023-06-02 NOTE — Addendum Note (Signed)
 Addended by: Lonnie Roberts R on: 06/02/2023 09:11 AM   Modules accepted: Orders

## 2023-06-10 DIAGNOSIS — R208 Other disturbances of skin sensation: Secondary | ICD-10-CM | POA: Diagnosis not present

## 2023-06-10 DIAGNOSIS — Z85820 Personal history of malignant melanoma of skin: Secondary | ICD-10-CM | POA: Diagnosis not present

## 2023-06-10 DIAGNOSIS — L2989 Other pruritus: Secondary | ICD-10-CM | POA: Diagnosis not present

## 2023-06-10 DIAGNOSIS — C44311 Basal cell carcinoma of skin of nose: Secondary | ICD-10-CM | POA: Diagnosis not present

## 2023-06-10 DIAGNOSIS — L538 Other specified erythematous conditions: Secondary | ICD-10-CM | POA: Diagnosis not present

## 2023-06-10 DIAGNOSIS — Z789 Other specified health status: Secondary | ICD-10-CM | POA: Diagnosis not present

## 2023-06-10 DIAGNOSIS — L821 Other seborrheic keratosis: Secondary | ICD-10-CM | POA: Diagnosis not present

## 2023-06-10 DIAGNOSIS — Z08 Encounter for follow-up examination after completed treatment for malignant neoplasm: Secondary | ICD-10-CM | POA: Diagnosis not present

## 2023-06-10 DIAGNOSIS — D225 Melanocytic nevi of trunk: Secondary | ICD-10-CM | POA: Diagnosis not present

## 2023-06-10 DIAGNOSIS — Z85828 Personal history of other malignant neoplasm of skin: Secondary | ICD-10-CM | POA: Diagnosis not present

## 2023-06-10 DIAGNOSIS — L820 Inflamed seborrheic keratosis: Secondary | ICD-10-CM | POA: Diagnosis not present

## 2023-06-10 DIAGNOSIS — L814 Other melanin hyperpigmentation: Secondary | ICD-10-CM | POA: Diagnosis not present

## 2023-06-11 ENCOUNTER — Encounter: Payer: Self-pay | Admitting: Internal Medicine

## 2023-06-11 NOTE — Telephone Encounter (Signed)
 Attempted to contact patient, currently not at home. Wife states he has been taking medication as prescribed, unsure about any symptoms from lower readings of BP (dizziness). According to wife, patient felt well this morning and currently at his water aerobics class. Instructed wife that I would forward his message to Dr Micael Adas and her nurse to touch base at a later time but to contact our office for any changes/symptoms.

## 2023-06-12 ENCOUNTER — Encounter: Payer: Self-pay | Admitting: Internal Medicine

## 2023-06-12 ENCOUNTER — Telehealth: Payer: Self-pay | Admitting: Cardiology

## 2023-06-12 ENCOUNTER — Other Ambulatory Visit: Payer: Self-pay

## 2023-06-12 NOTE — Telephone Encounter (Signed)
 OK with me.

## 2023-06-12 NOTE — Telephone Encounter (Signed)
 Pt called in requesting to switch to Dr. Theodis Fiscal, is this switch okay?

## 2023-06-12 NOTE — Telephone Encounter (Signed)
 Error

## 2023-06-12 NOTE — Telephone Encounter (Signed)
 Pt called in about this mychart, he is ready to get EKG set up. Please advise.

## 2023-06-13 NOTE — Telephone Encounter (Signed)
 Pt called in stating he wants to switch to first available at [facility] because that it closer to his home. Dr. Theodis Fiscal is booked out right now and pt declined seeing APP. Dr. Renna Cary, are you okay with taking patient on to your care?

## 2023-06-16 ENCOUNTER — Other Ambulatory Visit: Payer: Self-pay

## 2023-06-16 ENCOUNTER — Ambulatory Visit: Attending: Cardiovascular Disease

## 2023-06-16 VITALS — HR 67 | Ht 73.0 in | Wt 239.6 lb

## 2023-06-16 DIAGNOSIS — I480 Paroxysmal atrial fibrillation: Secondary | ICD-10-CM

## 2023-06-16 NOTE — Progress Notes (Unsigned)
   Nurse Visit   Date of Encounter: 06/16/2023 ID: Randall Chen, DOB 24-Sep-1932, MRN [id number]  PCP:  Randall Feeling, MD   [facility] Providers Cardiologist:  Randall Keas, MD Electrophysiologist:  Randall Sells, MD { Click to update primary MD,subspecialty MD or APP then REFRESH:1}     Visit Details   VS:  There were no vitals taken for this visit. , BMI There is no height or weight on file to calculate BMI.  Wt Readings from Last 3 Encounters:  04/08/23 252 lb (114.3 kg)  10/17/22 252 lb (114.3 kg)  07/22/22 247 lb 3.2 oz (112.1 kg)     Reason for visit: EKG for assessment of recurrent afib Performed today: Vitals, EKG, Provider consulted:Dr. Micael Chen, and Education Changes (medications, testing, etc.) : Stop Metoprolol  Succinate (Toprol -XL), referral to afib clinic placed Length of Visit: 10 minutes    Medications Adjustments/Labs and Tests Ordered: No orders of the defined types were placed in this encounter.  No orders of the defined types were placed in this encounter.    Randall Ming, RN  06/16/2023 2:09 PM

## 2023-06-16 NOTE — Patient Instructions (Signed)
 Medication Instructions:  Your physician has recommended you make the following change in your medication:   STOP Metoprolol  Succinate (Toprol -XL)    *If you need a refill on your cardiac medications before your next appointment, please call your pharmacy*  Lab Work: None ordered today. If you have labs (blood work) drawn today and your tests are completely normal, you will receive your results only by: MyChart Message (if you have MyChart) OR A paper copy in the mail If you have any lab test that is abnormal or we need to change your treatment, we will call you to review the results.  Testing/Procedures: You had a nurse visit today 06/16/23 to have an EKG to assess for afib.  Follow-Up: At [facility], you and your health needs are our priority.  As part of our continuing mission to provide you with exceptional heart care, our providers are all part of one team.  This team includes your primary Cardiologist (physician) and Advanced Practice Providers or APPs (Physician Assistants and Nurse Practitioners) who all work together to provide you with the care you need, when you need it.  Your next appointment:   Someone from our office will call you to set up an appointment with afib clinic.

## 2023-06-19 NOTE — Telephone Encounter (Signed)
 I believe he sees Dr. Micael Adas.   Tailey Top Harpster, DO, FACC

## 2023-06-20 ENCOUNTER — Encounter: Payer: Self-pay | Admitting: Internal Medicine

## 2023-07-01 ENCOUNTER — Ambulatory Visit (HOSPITAL_COMMUNITY)
Admission: RE | Admit: 2023-07-01 | Discharge: 2023-07-01 | Disposition: A | Discharge status: Home / Self Care | Source: Ambulatory Visit | Attending: Internal Medicine | Admitting: Internal Medicine

## 2023-07-01 VITALS — BP 132/64 | Ht 73.0 in | Wt 237.4 lb

## 2023-07-01 DIAGNOSIS — Z5181 Encounter for therapeutic drug level monitoring: Secondary | ICD-10-CM | POA: Diagnosis not present

## 2023-07-01 DIAGNOSIS — I480 Paroxysmal atrial fibrillation: Secondary | ICD-10-CM

## 2023-07-01 DIAGNOSIS — Z79899 Other long term (current) drug therapy: Secondary | ICD-10-CM

## 2023-07-01 DIAGNOSIS — I4891 Unspecified atrial fibrillation: Secondary | ICD-10-CM

## 2023-07-01 DIAGNOSIS — D6869 Other thrombophilia: Secondary | ICD-10-CM

## 2023-07-01 NOTE — Progress Notes (Signed)
 Primary Care Physician: Tena Feeling, MD Primary Cardiologist: Gaylyn Keas, MD Electrophysiologist: Manya Sells, MD     Referring Physician:      GUSTAVO DISPENZA is a 89 y.o. male with a history of CAD, OSA, aortic root dilatation, dyslipidemia, mild aortic stenosis, HTN, sinus node dysfunction, and atrial fibrillation who presents for consultation in the [facility]. Patient noted he was in Afib on 5/14 and back in normal rhythm on 5/23. Patient is on Eliquis  5 mg BID for a CHADS2VASC score of 4.  On evaluation today, he is currently in NSR. He is on Tikosyn  250 mcg BID. No missed doses of Eliquis  5 mg BID. He did not make any changes to his Avapro  as noted in phone messages. He has a Kardiamobile device and notes he has not had Afib since 5/23. Overall, review of home BP recordings show previously some low BP with metoprolol  and avapro  300 mg daily. Recent BP readings overall stable with a few high readings noted (150-160 systolic occasionally).  Today, he denies symptoms of palpitations, chest pain, shortness of breath, orthopnea, PND, lower extremity edema, dizziness, presyncope, syncope, snoring, daytime somnolence, bleeding, or neurologic sequela. The patient is tolerating medications without difficulties and is otherwise without complaint today.    Atrial Fibrillation Risk Factors:  he does have symptoms or diagnosis of sleep apnea. he is compliant with CPAP therapy.  he has a BMI of Body mass index is 31.32 kg/m.Aaron Aas Filed Weights   07/01/23 1128  Weight: 107.7 kg    Current Outpatient Medications  Medication Sig Dispense Refill   apixaban  (ELIQUIS ) 5 MG TABS tablet Take 1 tablet (5 mg total) by mouth 2 (two) times daily. 180 tablet 1   dofetilide  (TIKOSYN ) 250 MCG capsule TAKE 1 CAPSULE BY MOUTH TWICE DAILY 180 capsule 3   ezetimibe  (ZETIA ) 10 MG tablet TAKE 1 TABLET(10 MG) BY MOUTH DAILY 90 tablet 3   fluticasone  (FLONASE ) 50 MCG/ACT nasal  spray Place 1 spray into both nostrils daily as needed for allergies or rhinitis.     irbesartan  (AVAPRO ) 300 MG tablet Take 1 tablet by mouth daily.     magnesium  oxide (MAG-OX) 400 MG tablet Take 1 tablet (400 mg total) by mouth daily. 90 tablet 1   nitroGLYCERIN  (NITROSTAT ) 0.4 MG SL tablet Place 1 tablet (0.4 mg total) under the tongue every 5 (five) minutes as needed for chest pain. 25 tablet 3   rosuvastatin  (CRESTOR ) 5 MG tablet TAKE 1 TABLET(5 MG) BY MOUTH 3 TIMES A WEEK 108 tablet 3   tamsulosin  (FLOMAX ) 0.4 MG CAPS capsule Take 0.4 mg by mouth daily.     traZODone (DESYREL) 50 MG tablet Take 50 mg by mouth at bedtime as needed.     triamcinolone  cream (KENALOG ) 0.1 % APPLY TO THE AFFECTED AREA EVERY DAY AS NEEDED 454 g 1   vitamin B-12 (CYANOCOBALAMIN ) 100 MCG tablet Take 100 mcg by mouth daily.     No current facility-administered medications for this encounter.   Facility-Administered Medications Ordered in Other Encounters  Medication Dose Route Frequency Provider Last Rate Last Admin   0.9 %  sodium chloride  infusion    Continuous PRN Glorya Larsson, CRNA   New Bag at 07/19/13 5784    Atrial Fibrillation Management history:  Previous antiarrhythmic drugs: tikosyn  Previous cardioversions: 2015, 02/2016, 10/2016 Previous ablations: none Anticoagulation history: Eliquis    ROS- All systems are reviewed and negative except as per the HPI above.  Physical Exam: BP 132/64   Ht 6\' 1"  (1.854 m)   Wt 107.7 kg   BMI 31.32 kg/m   GEN: Well nourished, well developed in no acute distress NECK: No JVD; No carotid bruits CARDIAC: Regular bradycardic rate and rhythm, no murmurs, rubs, gallops RESPIRATORY:  Clear to auscultation without rales, wheezing or rhonchi  ABDOMEN: Soft, non-tender, non-distended EXTREMITIES:  No edema; No deformity   EKG today demonstrates  Vent. rate 55 BPM PR interval 216 ms QRS duration 96 ms QT/QTcB 456/436 ms P-R-T axes 24 -55 9 Sinus  bradycardia with 1st degree A-V block Left anterior fascicular block Possible Lateral infarct (cited on or before 08-Apr-2023) Abnormal ECG When compared with ECG of 16-Jun-2023 14:21, Sinus rhythm has replaced Atrial fibrillation Nonspecific T wave abnormality no longer evident in Lateral leads  Echo 10/01/2022 demonstrated  1. Left ventricular ejection fraction, by estimation, is 60 to 65%. The  left ventricle has normal function. The left ventricle has no regional  wall motion abnormalities. There is mild left ventricular hypertrophy.  Left ventricular diastolic parameters  are consistent with Grade I diastolic dysfunction (impaired relaxation).  Elevated left atrial pressure.   2. Right ventricular systolic function is normal. The right ventricular  size is normal.   3. The mitral valve is normal in structure. Trivial mitral valve  regurgitation. No evidence of mitral stenosis.   4. The aortic valve is tricuspid. Aortic valve regurgitation is trivial.  Mild aortic valve stenosis.   5. Aortic dilatation noted. There is mild dilatation of the aortic root,  measuring 40 mm.   6. The inferior vena cava is normal in size with greater than 50%  respiratory variability, suggesting right atrial pressure of 3 mmHg.    ASSESSMENT & PLAN CHA2DS2-VASc Score = 4  The patient's score is based upon: CHF History: 0 HTN History: 1 Diabetes History: 0 Stroke History: 0 Vascular Disease History: 1 Age Score: 2 Gender Score: 0      Chads 4 with HTN CAD  ASSESSMENT AND PLAN: Paroxysmal Atrial Fibrillation (ICD10:  I48.0) The patient's CHA2DS2-VASc score is 4, indicating a 4.8% annual risk of stroke.    He is currently in NSR. We discussed management of Afib with continuation of Tikosyn  and whether to add back metoprolol  or not. After discussion, he has had no further episodes since 5/23. Will continue with current regiment of avapro  300 mg daily and no metoprolol . I advised patient if he  notes increased Afib burden via Kardiamobile device checks, will go ahead and start Toprol  12.5 mg daily and decrease avapro  to 150 mg daily. He is planning on transitioning to a different cardiologist.  Secondary Hypercoagulable State (ICD10:  [phone]) The patient is at significant risk for stroke/thromboembolism based upon his CHA2DS2-VASc Score of 4.  Continue Apixaban  (Eliquis ).  Continue Eliquis  without interruption.   High risk medication monitoring (ICD10: [id number]) Patient requires ongoing monitoring for anti-arrhythmic medication which has the potential to cause life threatening arrhythmias or AV block. Qtc stable. Continue Tikosyn  250 mcg BID. Bmet and mag drawn today.    Follow up as scheduled with Dr. Carolynne Citron, January 2026 with Afib clinic for Tikosyn  surveillance.    Minnie Amber, PA-C  Afib Clinic [facility] [address] [place], [place] [address] [phone]

## 2023-07-01 NOTE — Telephone Encounter (Signed)
 Pt called in for an update on this request. This pt would like really to switch to someone at [facility] office and get something scheduled, would you be willing to accept him as a pt?

## 2023-07-02 ENCOUNTER — Ambulatory Visit (HOSPITAL_COMMUNITY): Payer: Self-pay | Admitting: Internal Medicine

## 2023-07-02 LAB — BASIC METABOLIC PANEL WITH GFR
BUN/Creatinine Ratio: 23 (ref 10–24)
BUN: 27 mg/dL (ref 8–27)
CO2: 20 mmol/L (ref 20–29)
Calcium: 9.5 mg/dL (ref 8.6–10.2)
Chloride: 103 mmol/L (ref 96–106)
Creatinine, Ser: 1.15 mg/dL (ref 0.76–1.27)
Glucose: 94 mg/dL (ref 70–99)
Potassium: 5.2 mmol/L (ref 3.5–5.2)
Sodium: 139 mmol/L (ref 134–144)
eGFR: 61 mL/min/{1.73_m2} (ref >59)

## 2023-07-02 LAB — MAGNESIUM: Magnesium: 2 mg/dL (ref 1.6–2.3)

## 2023-07-02 NOTE — Telephone Encounter (Signed)
 Called pt, scheduled 10/21/23 w/ Dr. Veryl Gottron. Declined APP visit.

## 2023-07-08 DIAGNOSIS — R911 Solitary pulmonary nodule: Secondary | ICD-10-CM | POA: Diagnosis not present

## 2023-07-08 DIAGNOSIS — D649 Anemia, unspecified: Secondary | ICD-10-CM | POA: Diagnosis not present

## 2023-07-08 DIAGNOSIS — Z23 Encounter for immunization: Secondary | ICD-10-CM | POA: Diagnosis not present

## 2023-07-08 DIAGNOSIS — G4733 Obstructive sleep apnea (adult) (pediatric): Secondary | ICD-10-CM | POA: Diagnosis not present

## 2023-07-08 DIAGNOSIS — I480 Paroxysmal atrial fibrillation: Secondary | ICD-10-CM | POA: Diagnosis not present

## 2023-07-08 DIAGNOSIS — Z79899 Other long term (current) drug therapy: Secondary | ICD-10-CM | POA: Diagnosis not present

## 2023-07-08 DIAGNOSIS — N1831 Chronic kidney disease, stage 3a: Secondary | ICD-10-CM | POA: Diagnosis not present

## 2023-07-08 DIAGNOSIS — I10 Essential (primary) hypertension: Secondary | ICD-10-CM | POA: Diagnosis not present

## 2023-07-08 DIAGNOSIS — M1711 Unilateral primary osteoarthritis, right knee: Secondary | ICD-10-CM | POA: Diagnosis not present

## 2023-07-08 DIAGNOSIS — Z0000 Encounter for general adult medical examination without abnormal findings: Secondary | ICD-10-CM | POA: Diagnosis not present

## 2023-07-08 DIAGNOSIS — I2510 Atherosclerotic heart disease of native coronary artery without angina pectoris: Secondary | ICD-10-CM | POA: Diagnosis not present

## 2023-07-08 DIAGNOSIS — D6869 Other thrombophilia: Secondary | ICD-10-CM | POA: Diagnosis not present

## 2023-07-22 ENCOUNTER — Ambulatory Visit: Admitting: Podiatry

## 2023-07-22 ENCOUNTER — Encounter: Payer: Self-pay | Admitting: Podiatry

## 2023-07-22 DIAGNOSIS — M79675 Pain in left toe(s): Secondary | ICD-10-CM

## 2023-07-22 DIAGNOSIS — M79674 Pain in right toe(s): Secondary | ICD-10-CM | POA: Diagnosis not present

## 2023-07-22 DIAGNOSIS — B351 Tinea unguium: Secondary | ICD-10-CM | POA: Diagnosis not present

## 2023-07-22 NOTE — Progress Notes (Signed)
 Subjective:  Patient ID: Randall Chen, male    DOB: 08-06-1932,  MRN: [id number]  Patient notes nails are thick, discolored, elongated and painful in shoegear when trying to ambulate.    PCP is Dwight Trula SQUIBB, MD.  Past Medical History:  Diagnosis Date   Aortic stenosis    mild by echo 09/2022 with mean AVG   BCC (basal cell carcinoma of skin) 05/19/2001   Pigmented BCC Left Outer mouth   BCC (basal cell carcinoma of skin) 06/22/2008   BCC Upper Left Back   BCC (basal cell carcinoma) 11/04/2012   BCC Superficial Mid Forehead    BCC (basal cell carcinoma) 12/02/2013   BCC Mid Forehead   Coronary disease    2 Vessel,inferior wall MI june 2003, PTCA and Stent RCA June 2003, PTCA stent, Left Circumflex OM 02 Mar 2003, Dr Shlomo   Dilated aortic root (HCC)    40mm by echo 09/2022   DJD (degenerative joint disease) of knee    LEFT   Dyslipidemia    GERD (gastroesophageal reflux disease)    History of rotator cuff syndrome    (Right) with biceps tendinitis   Hypertension    Melanoma (HCC) 09[birth year]29[birth year]1934   clark level 2 left post scalp tx with mohs Dr Anniece   Nodular basal cell carcinoma (BCC) 10/13/2013   Nod BCC Mid Forehead   Nodular basal cell carcinoma (BCC) 11/14/2015   BCC Nod. Mid Forehead   Nodular basal cell carcinoma (BCC) 07/16/2016   Left Forehead Med   Nodular basal cell carcinoma (BCC) 07/16/2016   Left Forehead Lat   Obesity    OSA (obstructive sleep apnea)    presented with HA and drowsiness,mild sleep apnea with oxygen desats to 78%   Persistent atrial fibrillation (HCC)    Prostate cancer (HCC)    /BPH, radioactive seed 2007, Nesi    SCC (squamous cell carcinoma) 06/22/2008   SCC In Situ Left Sideburn   SCC (squamous cell carcinoma) 06/22/2008   SCC In Situ Left Preauricular   SCC (squamous cell carcinoma) 06/22/2008   SCC In Situ Below Right Ear   SCC (squamous cell carcinoma) 11/04/2012   SCC In Situ Lower Right Forearm   SCC (squamous  cell carcinoma) 02/11/2014   SCC Left Nasal Blub   SCC (squamous cell carcinoma) 11/14/2015   SCC In Situ Left Side Neck   Visit for monitoring Tikosyn  therapy 08/12/2017    Past Surgical History:  Procedure Laterality Date   CARDIAC CATHETERIZATION     CARDIOVERSION N/A 08/27/2013   Procedure: CARDIOVERSION;  Surgeon: Wilbert JONELLE Shlomo, MD;  Location: [facility] ENDOSCOPY;  Service: Cardiovascular;  Laterality: N/A;   CARDIOVERSION N/A 03/15/2016   Procedure: CARDIOVERSION;  Surgeon: Leim VEAR Moose, MD;  Location: [facility] ENDOSCOPY;  Service: Cardiovascular;  Laterality: N/A;   CARDIOVERSION N/A 10/30/2016   Procedure: CARDIOVERSION;  Surgeon: Jeffrie Oneil BROCKS, MD;  Location: [facility] ENDOSCOPY;  Service: Cardiovascular;  Laterality: N/A;   CORONARY ANGIOPLASTY     JOINT REPLACEMENT     left knee replacement 2009   TEE WITHOUT CARDIOVERSION N/A 10/30/2016   Procedure: TRANSESOPHAGEAL ECHOCARDIOGRAM (TEE);  Surgeon: Jeffrie Oneil BROCKS, MD;  [facility] END[facility];  Service: Cardiovascular;  Laterality: N/A;    Allergies  Allergen Reactions   Prednisone Other (See Comments)    anxiety   Zocor [Simvastatin] Other (See Comments)    Muscle aches   Diphenhydramine     Other Reaction(s): contraindicated w/Tykosin  Yellow Jacket Venom [Bee Venom] Swelling    Swelling and raised bumps at site   Hydrocodone Rash   Oxycodone Hcl Rash    Review of Systems: Negative except as noted in the HPI.  Objective:  Randall Chen is a very pleasant 89 y.o. male in NAD. AAO x 3.  Vascular Examination: Capillary refill time is 3-5 seconds to toes bilateral. Palpable pedal pulses b/l LE. Digital hair present b/l.  Skin temperature gradient WNL b/l. No varicosities b/l. No cyanosis noted b/l.   Dermatological Examination: Pedal skin with normal turgor, texture and tone b/l. No open wounds. No interdigital macerations b/l. Toenails x10 are 3mm thick, discolored, dystrophic with subungual debris. There is pain with  compression of the nail plates.  They are elongated x10  Assessment/Plan: 1. Pain due to onychomycosis of toenails of both feet    The mycotic toenails were sharply debrided x10 with sterile nail nippers and a power debriding burr to decrease bulk/thickness and length.     Awanda CHARM Imperial, DPM, FACFAS [facility]     [address] [place], [place] [address]                Office [phone]  Fax [phone]

## 2023-07-28 ENCOUNTER — Other Ambulatory Visit: Payer: Self-pay | Admitting: Internal Medicine

## 2023-07-28 DIAGNOSIS — I4819 Other persistent atrial fibrillation: Secondary | ICD-10-CM

## 2023-07-28 NOTE — Telephone Encounter (Signed)
 Prescription refill request for Eliquis  received. Indication:afib Last office visit:6/25 Scr:1.15  6/25 Age: 89 Weight:107.7  kg  Prescription refilled

## 2023-08-20 ENCOUNTER — Ambulatory Visit: Attending: Internal Medicine | Admitting: Internal Medicine

## 2023-08-20 ENCOUNTER — Encounter: Payer: Self-pay | Admitting: Internal Medicine

## 2023-08-20 VITALS — BP 124/60 | HR 54 | Ht 73.0 in | Wt 241.0 lb

## 2023-08-20 DIAGNOSIS — I4891 Unspecified atrial fibrillation: Secondary | ICD-10-CM | POA: Diagnosis not present

## 2023-08-20 MED ORDER — IRBESARTAN 300 MG PO TABS: 150.0000 mg | ORAL_TABLET | Freq: Every day | ORAL | 3 refills | Status: DC

## 2023-08-20 MED ORDER — METOPROLOL SUCCINATE ER 25 MG PO TB24: 25.0000 mg | ORAL_TABLET | Freq: Every day | ORAL | 3 refills | Status: AC

## 2023-08-20 NOTE — Progress Notes (Signed)
 HPI Randall Chen returns today for followup of his atrial fib. He had done well with no symptoms on dofetilide  since he cut back on the red wine. He denies medical non-compliance. He remains active though and has lost  weight since his last visit. No sob or chest pain or edema. His bp had been low and he had his beta blocker stopped. He had 5 days of afib after he stopped the beta blocker.  Allergies  Allergen Reactions   Prednisone Other (See Comments)    anxiety   Zocor [Simvastatin] Other (See Comments)    Muscle aches   Diphenhydramine     Other Reaction(s): contraindicated w/Tykosin   Yellow Jacket Venom [Bee Venom] Swelling    Swelling and raised bumps at site   Hydrocodone Rash   Oxycodone Hcl Rash     Current Outpatient Medications  Medication Sig Dispense Refill   dofetilide  (TIKOSYN ) 250 MCG capsule TAKE 1 CAPSULE BY MOUTH TWICE DAILY 180 capsule 3   ELIQUIS  5 MG TABS tablet TAKE 1 TABLET(5 MG) BY MOUTH TWICE DAILY 180 tablet 1   ezetimibe  (ZETIA ) 10 MG tablet TAKE 1 TABLET(10 MG) BY MOUTH DAILY 90 tablet 3   fluticasone  (FLONASE ) 50 MCG/ACT nasal spray Place 1 spray into both nostrils daily as needed for allergies or rhinitis.     irbesartan  (AVAPRO ) 300 MG tablet Take 1 tablet by mouth daily.     magnesium  oxide (MAG-OX) 400 MG tablet Take 1 tablet (400 mg total) by mouth daily. 90 tablet 1   nitroGLYCERIN  (NITROSTAT ) 0.4 MG SL tablet Place 1 tablet (0.4 mg total) under the tongue every 5 (five) minutes as needed for chest pain. 25 tablet 3   rosuvastatin  (CRESTOR ) 5 MG tablet TAKE 1 TABLET(5 MG) BY MOUTH 3 TIMES A WEEK 108 tablet 3   tamsulosin  (FLOMAX ) 0.4 MG CAPS capsule Take 0.4 mg by mouth daily.     traZODone (DESYREL) 50 MG tablet Take 50 mg by mouth at bedtime as needed.     triamcinolone  cream (KENALOG ) 0.1 % APPLY TO THE AFFECTED AREA EVERY DAY AS NEEDED 454 g 1   vitamin B-12 (CYANOCOBALAMIN ) 100 MCG tablet Take 100 mcg by mouth daily.     No current  facility-administered medications for this visit.   Facility-Administered Medications Ordered in Other Visits  Medication Dose Route Frequency Provider Last Rate Last Admin   0.9 %  sodium chloride  infusion    Continuous PRN Claudene Delon SQUIBB, CRNA   New Bag at 07/19/13 9245     Past Medical History:  Diagnosis Date   Aortic stenosis    mild by echo 09/2022 with mean AVG   BCC (basal cell carcinoma of skin) 05/19/2001   Pigmented BCC Left Outer mouth   BCC (basal cell carcinoma of skin) 06/22/2008   BCC Upper Left Back   BCC (basal cell carcinoma) 11/04/2012   BCC Superficial Mid Forehead    BCC (basal cell carcinoma) 12/02/2013   BCC Mid Forehead   Coronary disease    2 Vessel,inferior wall MI june 2003, PTCA and Stent RCA June 2003, PTCA stent, Left Circumflex OM 02 Mar 2003, Dr Shlomo   Dilated aortic root (HCC)    40mm by echo 09/2022   DJD (degenerative joint disease) of knee    LEFT   Dyslipidemia    GERD (gastroesophageal reflux disease)    History of rotator cuff syndrome    (Right) with biceps tendinitis   Hypertension  Melanoma (HCC) 03/20/1932   clark level 2 left post scalp tx with mohs Dr Anniece   Nodular basal cell carcinoma (BCC) 10/13/2013   Nod BCC Mid Forehead   Nodular basal cell carcinoma (BCC) 11/14/2015   BCC Nod. Mid Forehead   Nodular basal cell carcinoma (BCC) 07/16/2016   Left Forehead Med   Nodular basal cell carcinoma (BCC) 07/16/2016   Left Forehead Lat   Obesity    OSA (obstructive sleep apnea)    presented with HA and drowsiness,mild sleep apnea with oxygen desats to 78%   Persistent atrial fibrillation (HCC)    Prostate cancer (HCC)    /BPH, radioactive seed 2007, Nesi    SCC (squamous cell carcinoma) 06/22/2008   SCC In Situ Left Sideburn   SCC (squamous cell carcinoma) 06/22/2008   SCC In Situ Left Preauricular   SCC (squamous cell carcinoma) 06/22/2008   SCC In Situ Below Right Ear   SCC (squamous cell carcinoma)  11/04/2012   SCC In Situ Lower Right Forearm   SCC (squamous cell carcinoma) 02/11/2014   SCC Left Nasal Blub   SCC (squamous cell carcinoma) 11/14/2015   SCC In Situ Left Side Neck   Visit for monitoring Tikosyn  therapy 08/12/2017    ROS:   All systems reviewed and negative except as noted in the HPI.   Past Surgical History:  Procedure Laterality Date   CARDIAC CATHETERIZATION     CARDIOVERSION N/A 08/27/2013   Procedure: CARDIOVERSION;  Surgeon: Wilbert JONELLE Bihari, MD;  Location: [facility] ENDOSCOPY;  Service: Cardiovascular;  Laterality: N/A;   CARDIOVERSION N/A 03/15/2016   Procedure: CARDIOVERSION;  Surgeon: Leim VEAR Moose, MD;  Location: [facility] ENDOSCOPY;  Service: Cardiovascular;  Laterality: N/A;   CARDIOVERSION N/A 10/30/2016   Procedure: CARDIOVERSION;  Surgeon: Jeffrie Oneil BROCKS, MD;  Location: [facility] ENDOSCOPY;  Service: Cardiovascular;  Laterality: N/A;   CORONARY ANGIOPLASTY     JOINT REPLACEMENT     left knee replacement 2009   TEE WITHOUT CARDIOVERSION N/A 10/30/2016   Procedure: TRANSESOPHAGEAL ECHOCARDIOGRAM (TEE);  Surgeon: Jeffrie Oneil BROCKS, MD;  Location: [facility] ENDOSCOPY;  Service: Cardiovascular;  Laterality: N/A;     Family History  Problem Relation Age of Onset   Diabetes Mother    Peripheral vascular disease Father    Heart attack Father    Coronary artery disease Brother    Liver cancer Brother    Coronary artery disease Brother    Heart attack Brother    Cancer Brother    Sudden death Brother    Fainting Brother      Social History   Socioeconomic History   Marital status: Married    Spouse name: Not on file   Number of children: Not on file   Years of education: Not on file   Highest education level: Not on file  Occupational History   Not on file  Tobacco Use   Smoking status: Former    Current packs/day: 0.00    Types: Cigarettes    Quit date: 01/28/1958    Years since quitting: 65.6   Smokeless tobacco: Never  Vaping Use   Vaping status: Never Used   Substance and Sexual Activity   Alcohol use: Yes    Comment: a few a week   Drug use: No   Sexual activity: Not on file  Other Topics Concern   Not on file  Social History Narrative   Not on file   Social Drivers of Health   Financial Resource Strain: Not on  file  Food Insecurity: Not on file  Transportation Needs: Not on file  Physical Activity: Not on file  Stress: Not on file  Social Connections: Not on file  Intimate Partner Violence: Not on file     BP 124/60   Pulse (!) 54   Ht 6' 1 (1.854 m)   Wt 241 lb (109.3 kg)   SpO2 96%   BMI 31.80 kg/m   Physical Exam:  Well appearing 89 yo man, NAD HEENT: Unremarkable Neck:  No JVD, no thyromegally Lymphatics:  No adenopathy Back:  No CVA tenderness Lungs:  Clear with no wheezes HEART:  Regular rate rhythm, no murmurs, no rubs, no clicks Abd:  soft, positive bowel sounds, no organomegally, no rebound, no guarding Ext:  2 plus pulses, no edema, no cyanosis, no clubbing Skin:  No rashes no nodules Neuro:  CN II through XII intact, motor grossly intact  EKG - nsr  DEVICE  Normal device function.  See PaceArt for details.   Assess/Plan:  PAF - he continues to maintain NSR very nicely. He will continue his current meds. I asked him to restart the toprol .  HTN - his bp had been low. I have asked him to restart the beta blocker and decrease the dose of avaprol to 150 daily.  Sinus node dysfunction - he is asymptomatic with no indication for PPM insertion. Dyslipidemia - he will continue crestor .   Danelle Waddell COME

## 2023-08-20 NOTE — Patient Instructions (Signed)
 Medication Instructions:  Your physician has recommended you make the following change in your medication:   Restart Troprol XL 25 mg daily.  Decrease Avapro  1/2 tablet daily. If top number of blood pressure is below 100 hold Avapro .   Lab Work: None ordered.  You may go to any [facility] Location for your lab work:  [facility] - [address] ([facility]) - [address] [phone] [address]  [place] - [address]   [place]  - [address]   [place] - [address] - [address] [employer]  - [address] [place] - [address] ([facility]   If you have labs (blood work) drawn today and your tests are completely normal, you will receive your results only by: [employer] (if you have MyChart)  If you have any lab test that is abnormal or we need to change your treatment, we will call you or send a MyChart message to review the results.  Testing/Procedures: None ordered.  Follow-Up: At [facility], you and your health needs are our priority.  As part of our continuing mission to provide you with exceptional heart care, we have created designated Provider Care Teams.  These Care Teams include your primary Cardiologist (physician) and Advanced Practice Providers (APPs -  Physician Assistants and Nurse Practitioners) who all work together to provide you with the care you need, when you need it.  We recommend signing up for the patient portal called MyChart.  Sign up information is provided on this After Visit Summary.  MyChart is used to connect with patients for Virtual Visits (Telemedicine).  Patients are able to view lab/test results, encounter notes, upcoming appointments, etc.  Non-urgent messages can be sent to your provider as well.   To learn more about what you can do with MyChart, go to [email or web address].    Your next appointment:   1  year(s)  The format for your next appointment:   In Person  Provider:   Donnice Primus, MD or one of the following Advanced Practice Providers on your designated Care Team:   Charlies Arthur, [place] Ozell Jodie Passey, [place] Leotis Barrack, NP  Note: Remote monitoring is used to monitor your Pacemaker/ ICD from home. This monitoring reduces the number of office visits required to check your device to one time per year. It allows us  to keep an eye on the functioning of your device to ensure it is working properly.

## 2023-08-26 ENCOUNTER — Encounter: Payer: Self-pay | Admitting: Internal Medicine

## 2023-08-28 DIAGNOSIS — I482 Chronic atrial fibrillation, unspecified: Secondary | ICD-10-CM | POA: Diagnosis not present

## 2023-08-28 DIAGNOSIS — I10 Essential (primary) hypertension: Secondary | ICD-10-CM | POA: Diagnosis not present

## 2023-08-28 DIAGNOSIS — N1831 Chronic kidney disease, stage 3a: Secondary | ICD-10-CM | POA: Diagnosis not present

## 2023-08-28 DIAGNOSIS — M1711 Unilateral primary osteoarthritis, right knee: Secondary | ICD-10-CM | POA: Diagnosis not present

## 2023-09-04 ENCOUNTER — Ambulatory Visit: Admitting: Cardiology

## 2023-09-28 DIAGNOSIS — N1831 Chronic kidney disease, stage 3a: Secondary | ICD-10-CM | POA: Diagnosis not present

## 2023-09-28 DIAGNOSIS — I10 Essential (primary) hypertension: Secondary | ICD-10-CM | POA: Diagnosis not present

## 2023-09-28 DIAGNOSIS — I482 Chronic atrial fibrillation, unspecified: Secondary | ICD-10-CM | POA: Diagnosis not present

## 2023-09-28 DIAGNOSIS — M1711 Unilateral primary osteoarthritis, right knee: Secondary | ICD-10-CM | POA: Diagnosis not present

## 2023-09-30 ENCOUNTER — Other Ambulatory Visit: Payer: Self-pay | Admitting: Cardiology

## 2023-10-01 ENCOUNTER — Telehealth: Payer: Self-pay | Admitting: Cardiology

## 2023-10-01 MED ORDER — EZETIMIBE 10 MG PO TABS: 10.0000 mg | ORAL_TABLET | Freq: Every day | ORAL | 0 refills | Status: DC

## 2023-10-01 NOTE — Telephone Encounter (Signed)
*  STAT* If patient is at the pharmacy, call can be transferred to refill team.   1. Which medications need to be refilled? (please list name of each medication and dose if known) ezetimibe  (ZETIA ) 10 MG tablet    2. Would you like to learn more about the convenience, safety, & potential cost savings by using the [facility]?      3. Are you open to using the [facility] (Type [facility].  ).   4. Which pharmacy/location (including street and city if local pharmacy) is medication to be sent to? [id number] - , [place] - [address] AT [address] OF [address] [address]    5. Do they need a 30 day or 90 day supply? 90 day

## 2023-10-01 NOTE — Telephone Encounter (Signed)
 RX sent in

## 2023-10-06 DIAGNOSIS — M25551 Pain in right hip: Secondary | ICD-10-CM | POA: Diagnosis not present

## 2023-10-08 ENCOUNTER — Encounter: Payer: Self-pay | Admitting: Cardiology

## 2023-10-08 ENCOUNTER — Ambulatory Visit: Payer: Self-pay | Admitting: Cardiology

## 2023-10-08 ENCOUNTER — Ambulatory Visit (HOSPITAL_COMMUNITY)
Admission: RE | Admit: 2023-10-08 | Discharge: 2023-10-08 | Disposition: A | Discharge status: Home / Self Care | Source: Ambulatory Visit | Attending: Cardiovascular Disease | Admitting: Cardiovascular Disease

## 2023-10-08 DIAGNOSIS — Z01812 Encounter for preprocedural laboratory examination: Secondary | ICD-10-CM

## 2023-10-08 DIAGNOSIS — Z79899 Other long term (current) drug therapy: Secondary | ICD-10-CM

## 2023-10-08 DIAGNOSIS — I77810 Thoracic aortic ectasia: Secondary | ICD-10-CM

## 2023-10-08 DIAGNOSIS — I350 Nonrheumatic aortic (valve) stenosis: Secondary | ICD-10-CM | POA: Insufficient documentation

## 2023-10-08 LAB — ECHOCARDIOGRAM COMPLETE
AR max vel: 1.57 cm2
AV Area VTI: 1.58 cm2
AV Area mean vel: 1.66 cm2
AV Mean grad: 11 mmHg
AV Peak grad: 21 mmHg
Ao pk vel: 2.29 m/s
S' Lateral: 3.23 cm

## 2023-10-08 MED ORDER — PERFLUTREN LIPID MICROSPHERE
1.0000 mL | INTRAVENOUS | Status: AC | PRN
  Administered 2023-10-08: 2 mL via INTRAVENOUS

## 2023-10-08 NOTE — Telephone Encounter (Signed)
 Call to patient to review that echo showed normal normal pumping function of the heart EF 55 to 60% with mildly thickened heart muscle and increase stiffness of the heart muscle called diastolic dysfunction. Patient verbalizes understanding that The RV is mildly enlarged with normal RV function. Aortic valve is calcified with mild aortic stenosis mean aortic valve gradient 11 mmHg.  Mild dilatation of the aortic root measuring 44 mm and of ascending aorta measuring 40 mm. Patient agrees to chest CTA with contrast to reassess aortic dimensions and to repeat echo in 1 year.

## 2023-10-08 NOTE — Telephone Encounter (Signed)
-----   Message from Wilbert Bihari sent at 10/08/2023  2:06 PM EDT ----- 2D echo showed normal normal pumping function of the heart EF 55 to 60% with mildly thickened heart muscle and increase stiffness of the heart muscle called diastolic dysfunction.  The RV is mildly  enlarged with normal RV function.  Aortic valve is calcified with mild aortic stenosis mean aortic valve gradient 11 mmHg.  Mild dilatation of the aortic root measuring 44 mm and of ascending aorta  measuring 40 mm.  Please get chest CTA with contrast to reassess aortic dimensions.  Repeat 2D echo in 1 year ----- Message ----- From: Interface, Three One Seven Sent: 10/08/2023  11:20 AM EDT To: Wilbert JONELLE Bihari, MD

## 2023-10-11 LAB — BASIC METABOLIC PANEL WITH GFR
BUN/Creatinine Ratio: 23 (ref 10–24)
BUN: 28 mg/dL — ABNORMAL HIGH (ref 8–27)
CO2: 22 mmol/L (ref 20–29)
Calcium: 9.4 mg/dL (ref 8.6–10.2)
Chloride: 102 mmol/L (ref 96–106)
Creatinine, Ser: 1.23 mg/dL (ref 0.76–1.27)
Glucose: 91 mg/dL (ref 70–99)
Potassium: 5.4 mmol/L — ABNORMAL HIGH (ref 3.5–5.2)
Sodium: 136 mmol/L (ref 134–144)
eGFR: 56 mL/min/1.73 — ABNORMAL LOW (ref >59)

## 2023-10-20 ENCOUNTER — Encounter (HOSPITAL_BASED_OUTPATIENT_CLINIC_OR_DEPARTMENT_OTHER): Payer: Self-pay

## 2023-10-21 ENCOUNTER — Ambulatory Visit (HOSPITAL_BASED_OUTPATIENT_CLINIC_OR_DEPARTMENT_OTHER): Admitting: Cardiology

## 2023-10-21 ENCOUNTER — Encounter (HOSPITAL_BASED_OUTPATIENT_CLINIC_OR_DEPARTMENT_OTHER): Payer: Self-pay | Admitting: Cardiology

## 2023-10-21 VITALS — BP 122/64 | HR 52 | Ht 73.0 in | Wt 243.0 lb

## 2023-10-21 DIAGNOSIS — I350 Nonrheumatic aortic (valve) stenosis: Secondary | ICD-10-CM

## 2023-10-21 DIAGNOSIS — D6869 Other thrombophilia: Secondary | ICD-10-CM

## 2023-10-21 DIAGNOSIS — I10 Essential (primary) hypertension: Secondary | ICD-10-CM | POA: Diagnosis not present

## 2023-10-21 DIAGNOSIS — I77810 Thoracic aortic ectasia: Secondary | ICD-10-CM | POA: Diagnosis not present

## 2023-10-21 DIAGNOSIS — Z7901 Long term (current) use of anticoagulants: Secondary | ICD-10-CM | POA: Diagnosis not present

## 2023-10-21 DIAGNOSIS — I2510 Atherosclerotic heart disease of native coronary artery without angina pectoris: Secondary | ICD-10-CM | POA: Diagnosis not present

## 2023-10-21 DIAGNOSIS — I480 Paroxysmal atrial fibrillation: Secondary | ICD-10-CM | POA: Diagnosis not present

## 2023-10-21 MED ORDER — IRBESARTAN 150 MG PO TABS: 150.0000 mg | ORAL_TABLET | Freq: Every day | ORAL | 3 refills | Status: AC

## 2023-10-21 NOTE — Progress Notes (Signed)
 Cardiology Office Note:  .   Date:  10/21/2023  ID:  Randall Chen, DOB 08-23-1932, MRN [id number] PCP: Randall Trula SQUIBB, MD  [facility] Providers Cardiologist:  Randall Bruckner, MD Electrophysiologist:  Randall Birmingham, MD {  History of Present Illness: .   Randall Chen is a 89 y.o. male with PMH CAD with inferior MI 2003 s/p PCI of RCA, PCI to Lcx/OM 2005, bicuspid aortic valve with mild AS, paroxysmal atrial fibrillation, OSA on CPAP, hypertension. He was previously followed by Randall Chen and established care with me on 10/21/23  Today: Brings a list of questions today. First is related to blood pressure. He is now taking half dose of ARB after low blood pressure readings over the summer. He brings a blood pressure log today. Symptomatic hypotension was in July, was 84/42. Since cutting dose, range has been 96/43-148/68, averages ~120/60. Asymptomatic.   Next question is re: afib. Saw Randall Chen, has been maintained on dofetilide . Only one episode of afib in the last 3-4 years, which occurred over the summer, lasted about a week. Has a kardia mobile that he checks every day. He would like to just follow with me as long as management is stable.  Third question is regarding aortic dilation. We discussed at length today, see below.   ROS: Denies chest pain, shortness of breath at rest or with normal exertion. No PND, orthopnea, LE edema or unexpected weight gain. No syncope or palpitations. ROS otherwise negative except as noted.   Studies Reviewed: SABRA    EKG:       Physical Exam:   VS:  BP 122/64   Pulse (!) 52   Ht 6' 1 (1.854 m)   Wt 243 lb (110.2 kg)   SpO2 94%   BMI 32.06 kg/m    Wt Readings from Last 3 Encounters:  10/21/23 243 lb (110.2 kg)  08/20/23 241 lb (109.3 kg)  07/01/23 237 lb 6.4 oz (107.7 kg)    GEN: Well nourished, well developed in no acute distress HEENT: Normal, moist mucous membranes NECK: No JVD CARDIAC: regular rhythm, normal S1 and S2, no  rubs or gallops. 2/6 early peaking systolic murmur. VASCULAR: Radial and DP pulses 2+ bilaterally. No carotid bruits RESPIRATORY:  Clear to auscultation without rales, wheezing or rhonchi  ABDOMEN: Soft, non-tender, non-distended MUSCULOSKELETAL:  Ambulates independently SKIN: Warm and dry, no edema NEUROLOGIC:  Alert and oriented x 3. No focal neuro deficits noted. PSYCHIATRIC:  Normal affect    ASSESSMENT AND PLAN: .    CAD with prior MI and stenting Hypercholesterolemia -continue metoprolol  succinate 25 mg daily, rosuvastatin  5 mg daily -last LDL 48 at [facility] 06/2023 -no aspirin as he is on apixaban   Bicuspid aortic valve with mild aortic stenosis Dilated aortic root -reviewed echo results today. EF 55-60%. Aortic valve thickened/calcified but AVA 1.58, mean gradient 11, DI 0.46, not severe (likely mild to at most moderate based on DI) -aortic root 4.4, ascending aorta 4.0 on echo 09/2023. Ordered for CT angio by Randall Chen. After shared decision making, we will not pursue CT angio and will instead monitor with annual echo, pursuing CT only if there is significant change by echo. We discussed that his CT measurements have been better than his echo measurements.  Hypertension -well controlled -continue irbesartan  at lower dose (150 mg daily), will send in new script  Paroxysmal atrial fibrillation -CHA2DS2/VAS Stroke Risk Points = 4   -on tikosyn , currently followed by Randall Chen until he retires,  then wishes to follow with me -continue apixaban , full dose given weight and renal function -follow for tikosyn  monitoring with labs and ECG every 6 mos (he wants to follow with me)  OSA on CPAP -continue CPAP  CV risk counseling and prevention -recommend heart healthy/Mediterranean diet, with whole grains, fruits, vegetable, fish, lean meats, nuts, and olive oil. Limit salt. -recommend moderate walking, 3-5 times/week for 30-50 minutes each session. Aim for at least 150 minutes/week.  Goal should be pace of 3 miles/hours, or walking 1.5 miles in 30 minutes -recommend avoidance of tobacco products. Avoid excess alcohol.  Dispo: 6 mos for tikosyn  monitoring  Total time of encounter: I spent 41 minutes dedicated to the care of this patient on the date of this encounter to include pre-visit review of records, face-to-face time with the patient discussing conditions above, and clinical documentation with the electronic health record. We specifically spent time today discussing blood pressure management, afib history, review of echo, discussion of aorta management.   Signed, Randall Bruckner, MD   Randall Bruckner, MD, PhD, [facility]  [facility]  [place]  [facility] at [facility] at [facility] [address] [place], [place] [address] [phone]

## 2023-10-21 NOTE — Patient Instructions (Signed)

## 2023-10-23 ENCOUNTER — Other Ambulatory Visit: Payer: Self-pay | Admitting: Student

## 2023-10-24 ENCOUNTER — Other Ambulatory Visit: Payer: Self-pay

## 2023-10-24 DIAGNOSIS — M25551 Pain in right hip: Secondary | ICD-10-CM | POA: Diagnosis not present

## 2023-10-24 MED ORDER — DOFETILIDE 250 MCG PO CAPS: 250.0000 ug | ORAL_CAPSULE | Freq: Two times a day (BID) | ORAL | 2 refills | Status: AC

## 2023-10-27 DIAGNOSIS — M1711 Unilateral primary osteoarthritis, right knee: Secondary | ICD-10-CM | POA: Diagnosis not present

## 2023-10-28 ENCOUNTER — Telehealth: Payer: Self-pay | Admitting: Internal Medicine

## 2023-10-28 ENCOUNTER — Encounter: Payer: Self-pay | Admitting: Podiatry

## 2023-10-28 ENCOUNTER — Ambulatory Visit (INDEPENDENT_AMBULATORY_CARE_PROVIDER_SITE_OTHER): Admitting: Podiatry

## 2023-10-28 DIAGNOSIS — M79674 Pain in right toe(s): Secondary | ICD-10-CM | POA: Diagnosis not present

## 2023-10-28 DIAGNOSIS — N1831 Chronic kidney disease, stage 3a: Secondary | ICD-10-CM | POA: Diagnosis not present

## 2023-10-28 DIAGNOSIS — B351 Tinea unguium: Secondary | ICD-10-CM | POA: Diagnosis not present

## 2023-10-28 DIAGNOSIS — M79675 Pain in left toe(s): Secondary | ICD-10-CM

## 2023-10-28 DIAGNOSIS — I482 Chronic atrial fibrillation, unspecified: Secondary | ICD-10-CM | POA: Diagnosis not present

## 2023-10-28 DIAGNOSIS — R234 Changes in skin texture: Secondary | ICD-10-CM

## 2023-10-28 DIAGNOSIS — M1711 Unilateral primary osteoarthritis, right knee: Secondary | ICD-10-CM | POA: Diagnosis not present

## 2023-10-28 DIAGNOSIS — I10 Essential (primary) hypertension: Secondary | ICD-10-CM | POA: Diagnosis not present

## 2023-10-28 MED ORDER — GENTAMICIN SULFATE 0.1 % EX OINT: 1.0000 | TOPICAL_OINTMENT | Freq: Every day | CUTANEOUS | 0 refills | Status: AC

## 2023-10-28 NOTE — Telephone Encounter (Signed)
 Patient came in to drop off gift for Dr Waddell 10/28/2023. Given to his nurse Stevie.

## 2023-10-28 NOTE — Progress Notes (Signed)
 Subjective:  Patient ID: Randall Chen, male    DOB: [birth year],  MRN: [id number]  Patient notes nails are thick, discolored, elongated and painful in shoegear when trying to ambulate.  He is noting tenderness along with a skin crack located on the medial aspect of the right heel.  He has been applying Vaseline to the area.  Denies any drainage.  Denies injury.  PCP is Dwight Trula SQUIBB, MD.  Past Medical History:  Diagnosis Date   Aortic stenosis    Mild by echo 09/2023 with mean aortic valve gradient 11 mmHg   Ascending aorta dilatation    40 mm by echo 09/2023   BCC (basal cell carcinoma of skin) 05/19/2001   Pigmented BCC Left Outer mouth   BCC (basal cell carcinoma of skin) 06/22/2008   BCC Upper Left Back   BCC (basal cell carcinoma) 11/04/2012   BCC Superficial Mid Forehead    BCC (basal cell carcinoma) 12/02/2013   BCC Mid Forehead   Coronary disease    2 Vessel,inferior wall MI june 2003, PTCA and Stent RCA June 2003, PTCA stent, Left Circumflex OM 02 Mar 2003, Dr Shlomo   Dilated aortic root    44 mm by 2D echo 09/2023   DJD (degenerative joint disease) of knee    LEFT   Dyslipidemia    GERD (gastroesophageal reflux disease)    History of rotator cuff syndrome    (Right) with biceps tendinitis   Hypertension    Melanoma (HCC) 1934[birth year]   clark level 2 left post scalp tx with mohs Dr Anniece   Nodular basal cell carcinoma (BCC) 10/13/2013   Nod BCC Mid Forehead   Nodular basal cell carcinoma (BCC) 11/14/2015   BCC Nod. Mid Forehead   Nodular basal cell carcinoma (BCC) 07/16/2016   Left Forehead Med   Nodular basal cell carcinoma (BCC) 07/16/2016   Left Forehead Lat   Obesity    OSA (obstructive sleep apnea)    presented with HA and drowsiness,mild sleep apnea with oxygen desats to 78%   Persistent atrial fibrillation (HCC)    Prostate cancer (HCC)    /BPH, radioactive seed 2007, Nesi    SCC (squamous cell carcinoma) 06/22/2008   SCC In Situ Left Sideburn    SCC (squamous cell carcinoma) 06/22/2008   SCC In Situ Left Preauricular   SCC (squamous cell carcinoma) 06/22/2008   SCC In Situ Below Right Ear   SCC (squamous cell carcinoma) 11/04/2012   SCC In Situ Lower Right Forearm   SCC (squamous cell carcinoma) 02/11/2014   SCC Left Nasal Blub   SCC (squamous cell carcinoma) 11/14/2015   SCC In Situ Left Side Neck   Visit for monitoring Tikosyn  therapy 08/12/2017    Past Surgical History:  Procedure Laterality Date   CARDIAC CATHETERIZATION     CARDIOVERSION N/A 08/27/2013   Procedure: CARDIOVERSION;  Surgeon: Wilbert JONELLE Shlomo, MD;  Location: [facility] ENDOSCOPY;  Service: Cardiovascular;  Laterality: N/A;   CARDIOVERSION N/A 03/15/2016   Procedure: CARDIOVERSION;  Surgeon: Leim VEAR Moose, MD;  Location: [facility] ENDOSCOPY;  Service: Cardiovascular;  Laterality: N/A;   CARDIOVERSION N/A 10/30/2016   Procedure: CARDIOVERSION;  Surgeon: Jeffrie Oneil BROCKS, MD;  Location: [facility] ENDOSCOPY;  Service: Cardiovascular;  Laterality: N/A;   CORONARY ANGIOPLASTY     JOINT REPLACEMENT     left knee replacement 2009   TEE WITHOUT CARDIOVERSION N/A 10/30/2016   Procedure: TRANSESOPHAGEAL ECHOCARDIOGRAM (TEE);  Surgeon: Jeffrie Oneil BROCKS, MD;  Location: [facility] ENDOSCOPY;  Service: Cardiovascular;  Laterality: N/A;    Allergies  Allergen Reactions   Hydrocodone Rash and Hives   Prednisone Other (See Comments)    anxiety   Simvastatin Other (See Comments)    Muscle aches  Other Reaction(s): Not available  simvastatin   Bee Venom Swelling    Swelling and raised bumps at site  honey bee venom   Diphenhydramine     Other Reaction(s): contraindicated w/Tykosin  Other Reaction(s): Not available  diphenhydramine hydrochloride   Oxycodone Hcl Rash and Dermatitis    Review of Systems: Negative except as noted in the HPI.  Objective:  Randall Chen is a very pleasant 89 y.o. male in NAD. AAO x 3.  Vascular Examination: Capillary refill time is 3-5 seconds to toes  bilateral. Palpable pedal pulses b/l LE. Digital hair present b/l.  Skin temperature gradient WNL b/l. No varicosities b/l. No cyanosis noted b/l.   Dermatological Examination: Pedal skin with normal turgor, texture and tone b/l.  There is a vertical skin fissure, superficial in nature, on the medial aspect of the right heel.  No clinical signs of infection are noted.  No active drainage is noted.  There is tenderness on palpation to the area.  No interdigital macerations b/l. Toenails x10 are 3mm thick, discolored, dystrophic with subungual debris. There is pain with compression of the nail plates.  They are elongated x10  Assessment/Plan: 1. Pain due to onychomycosis of toenails of both feet   2. Fissure in skin of right foot    The mycotic toenails were sharply debrided x10 with sterile nail nippers and a power debriding burr to decrease bulk/thickness and length.    Steri-Strips along with antibiotic ointment and a light gauze dressing were applied to the area.  Will send in gentamicin ointment 0.1% prescription to his pharmacy for him to begin applying across the fissure.  He can either use a Band-Aid to help keep the area closed after applying the ointment or he can purchase over-the-counter Steri-Strips or butterfly adhesive to maintain closure while it heals.  Follow-up 3 months   Keean Wilmeth DSABRA Imperial, DPM, FACFAS [facility]     [address] [place], [place] [address]                Office [phone]  Fax [phone]

## 2023-11-03 DIAGNOSIS — M25551 Pain in right hip: Secondary | ICD-10-CM | POA: Diagnosis not present

## 2023-11-06 ENCOUNTER — Encounter (HOSPITAL_BASED_OUTPATIENT_CLINIC_OR_DEPARTMENT_OTHER): Payer: Self-pay

## 2024-01-11 ENCOUNTER — Other Ambulatory Visit: Payer: Self-pay | Admitting: Cardiology

## 2024-02-03 ENCOUNTER — Ambulatory Visit: Admitting: Podiatry

## 2024-02-05 ENCOUNTER — Other Ambulatory Visit: Payer: Self-pay | Admitting: Student

## 2024-02-24 ENCOUNTER — Encounter (HOSPITAL_BASED_OUTPATIENT_CLINIC_OR_DEPARTMENT_OTHER): Payer: Self-pay | Admitting: Cardiology

## 2024-02-24 ENCOUNTER — Telehealth: Payer: Self-pay | Admitting: Cardiology

## 2024-02-24 NOTE — Telephone Encounter (Signed)
" ° °  Pt c/o of Chest Pain: STAT if active CP, including tightness, pressure, jaw pain, radiating pain to shoulder/upper arm/back, CP unrelieved by Nitro. Symptoms reported of SOB, nausea, vomiting, sweating.  1. Are you having CP right now? Yes, has subsided but he still feels pressure   2. Are you experiencing any other symptoms (ex. SOB, nausea, vomiting, sweating)? No   3. Is your CP continuous or coming and going? Continuous   4. Have you taken Nitroglycerin ? Yes   5. How long have you been experiencing CP? Started today    6. If NO CP at time of call then end call with telling Pt to call back or call 911 if Chest pain returns prior to return call from triage team.  "

## 2024-02-24 NOTE — Telephone Encounter (Signed)
 See phone note

## 2024-02-24 NOTE — Progress Notes (Unsigned)
 " Cardiology Office Note   Date:  02/25/2024  ID:  Randall Chen, DOB 1932-03-19, MRN [id number] PCP: Dwight Trula SQUIBB, MD  [facility] Providers Cardiologist:  Shelda Bruckner, MD Electrophysiologist:  Danelle Birmingham, MD     [facility] Hypertension Hyperlipidemia CAD Inferior MI 2003 s/p PCI of RCA PCI to LCx/OM 2005 PAF on chronic anticoagulation Bicuspid aortic valve Aortic stenosis OSA on CPAP Thoracic aortic dilatation  Previously followed by Dr. Shlomo, he established care with Dr. Bruckner on 10/21/2023.  He had symptomatic hypotension July 2025 with BP 84/42.  Since cutting his ARB dose in half, BP range has been 96/43 - 148/68 with average around 120/60.  He is asymptomatic.  He has been maintained on dofetilide  for management of A-fib.  Only 1 episode of a fib in the previous 3 to 4 years which occurred over the summer and lasted for about a week.  He has a Optician, Dispensing mobile and he checks it every day.  Echo 10/08/23 revealed LVEF 55 to 60%, no RWMA, mild LVH, G1 DD, mildly enlarged RV, mild aortic valve stenosis with mean gradient 11 mmHg, mild dilatation of the aortic root measuring 44 mm, mild dilatation of the ascending aorta measuring 40 mm.  CT measurements in the past have been better than echo measurements.  He was advised to continue irbesartan  150 mg daily.  He requested to follow-up with Dr. Bruckner for management of A-fib given Dr. Iven upcoming retirement.  He called the office on 02/24/2024 to report 2 episodes of chest pain on that day and the day prior.  He took 1 nitroglycerin  tablet in the pain resolved.  He was exercising at the time.  ER precautions were advised and he was scheduled for an in office appointment.  History of Present Illness Discussed the use of AI scribe software for clinical note transcription with the patient, who gave verbal consent to proceed.  History of Present Illness Randall Chen is a very pleasant 89 year old man who is here  today for evaluation of chest pain.  He developed a burning chest discomfort across his chest while shoveling ice and carrying heavy buckets of hot water. Nitroglycerin  relieved the pain. The next day, the same activity caused similar pain. It resolved after two nitroglycerin  doses, but he feels one dose was not fully absorbed. He has had no chest pain since. He denies nausea, shortness of breath, or lightheadedness with the pain, though he did feel lightheaded after taking nitroglycerin . He attends vigorous water exercise classes three times weekly without symptoms.  He does not get short of breath or have any chest discomfort or lightheadedness with general activity.  History of coronary artery disease with stents placed in 2003 and 2005. BP is generally well controlled. He denies bleeding concerns. He checks his rhythm on Kardia monitor at home weekly and reports sinus rhythm, no new episodes of a fib. He reports a stable aneurysm last measured at 44 mm, a longstanding heart murmur, aortic calcification and plaque, and well-controlled cholesterol with LDL of 48.  ROS: + chest pain  Studies Reviewed EKG Interpretation Date/Time:  Wednesday February 25 2024 11:00:21 EST Ventricular Rate:  57 PR Interval:  216 QRS Duration:  106 QT Interval:  434 QTC Calculation: 422 R Axis:   -53  Text Interpretation: Sinus bradycardia with 1st degree A-V block Left anterior fascicular block Moderate voltage criteria for LVH, may be normal variant ( R in aVL , Cornell product ) Possible Lateral infarct (cited  on or before 15-Aug-2017) When compared with ECG of 20-Aug-2023 11:39, No significant change was found Confirmed by Percy Browning [phone]) on 02/25/2024 11:12:03 AM     No results found for: LIPOA  Risk Assessment/Calculations  CHA2DS2-VASc Score = 4   This indicates a 4.8% annual risk of stroke. The patient's score is based upon: CHF History: 0 HTN History: 1 Diabetes History: 0 Stroke History:  0 Vascular Disease History: 1 Age Score: 2 Gender Score: 0            Physical Exam VS:  BP 120/62 (BP Location: Left Arm, Patient Position: Sitting, Cuff Size: Normal)   Pulse (!) 57   Ht 6' 1 (1.854 m)   Wt 238 lb (108 kg)   SpO2 95%   BMI 31.40 kg/m    Wt Readings from Last 3 Encounters:  02/25/24 238 lb (108 kg)  10/21/23 243 lb (110.2 kg)  08/20/23 241 lb (109.3 kg)    GEN: Well nourished, well developed in no acute distress NECK: No JVD; No carotid bruits CARDIAC: RRR, no murmurs, rubs, gallops RESPIRATORY:  Clear to auscultation without rales, wheezing or rhonchi  ABDOMEN: Soft, non-tender, non-distended EXTREMITIES:  No edema; No deformity   Assessment & Plan CAD Chest pain   History of two prior coronary stents in 2003 and 2005. He reports no episodes of angina since that time. He experienced two recent episodes of chest pain while attempting to clear ice outside using buckets of hot water. Pain relieved by nitroglycerin  and potentially exacerbated by cold weather. No additional episodes of chest pain. He exercises with water aerobics several days per week and reports exercise is vigorous with no chest pain or dyspnea.  No acute changes on EKG today.  We discussed potential evaluation for worsening ischemia and he would like to proceed.  He is not on aspirin given need for [facility]. - Cardiac PET CT for ischemia evaluation - Treatment options were discussed in the event of abnormal test results - Continue Eliquis , ezetimibe , irbesartan , metoprolol , rosuvastatin   - Use SL nitroglycerin  as needed - Notify us  if symptoms worsen - Advised to avoid strenuous activities in cold weather and warm up slowly before exertion  PAF on chronic anticoagulation Atrial fibrillation is well controlled with current medications. Regular home EKG shows sinus rhythm. HR is well controlled. No bleeding complications.  EKG today reveals stable QTc. - Continue Tikosyn  250 mg twice daily and  metoprolol  25 mg daily - Continue regular home EKG monitoring - Continue Eliquis  5 mg twice daily which is appropriate dose for stroke prevention for CHA2DS2-VASc score of 4  Thoracic aortic aneurysm History of mild dilatation of aortic root and ascending aorta.  CTA aorta 03/2020 with greatest diameter of ascending aorta 3.5 cm, per radiologist negative for aortic aneurysm. Echo 10/08/2023 revealed mild dilatation of the aortic root measuring 44 mm, mild dilatation of the ascending aorta measuring 40 mm.  BP is well controlled. No acute concerns today. - Continue annual imaging  Heart murmur Aortic stenosis Longstanding heart murmur . Echo 10/08/2023 reveals moderate calcification of the aortic valve, mild aortic stenosis with mean gradient 11 mmHg, normal LVEF. He has some mild chest pain but no consistent chest pain, dyspnea, presyncope, or syncope with exertion. - Continue to monitor clinically for now      Informed Consent   Shared Decision Making/Informed Consent The risks [chest pain, shortness of breath, cardiac arrhythmias, dizziness, blood pressure fluctuations, myocardial infarction, stroke/transient ischemic attack, nausea, vomiting, allergic reaction,  radiation exposure, metallic taste sensation and life-threatening complications (estimated to be 1 in 10,000)], benefits (risk stratification, diagnosing coronary artery disease, treatment guidance) and alternatives of a cardiac PET stress test were discussed in detail with Mr. Mentink and he agrees to proceed.      Dispo: 3 months with Dr. Lonni or me  Signed, Rosaline Bane, NP-C "

## 2024-02-24 NOTE — Telephone Encounter (Signed)
 Spoke with patient who stated today when he was outside pouring hot water on the ice noticed chest pain, stated it was not bad chest pain.  Has taken 2 NTG, seems to have improved Has h/o stent 10 plus years ago, may feel like same pain however he can not really remember  Denies any shortness of breath, radiating pain, or other symptoms Below message from [employer] to P [facility] Triage (supporting Shelda Bruckner, MD)    02/24/24 12:35 PM I have experienced for the first time that I know of what I believe was angina yesterday and today.  I took one nitroglycerin  tablet and the angina left after a short while.  I just thought you should know.  I'm pretty sure it was due to exercise both days, and I will studiously avoid any heavy lifting or exercising.  I have an appointment in March, but I'd like to have one sooner. Thank you. Randall Chen   Scheduled patient appointment for tomorrow with Rosaline RAMAN NP  Advised patient if pain returns/uses 3 NTG needs to call 911 and proceed to ED for evaluation.  Patient verbalized understanding

## 2024-02-25 ENCOUNTER — Encounter (HOSPITAL_BASED_OUTPATIENT_CLINIC_OR_DEPARTMENT_OTHER): Payer: Self-pay | Admitting: Nurse Practitioner

## 2024-02-25 ENCOUNTER — Ambulatory Visit (INDEPENDENT_AMBULATORY_CARE_PROVIDER_SITE_OTHER): Admitting: Nurse Practitioner

## 2024-02-25 VITALS — BP 120/62 | HR 57 | Ht 73.0 in | Wt 238.0 lb

## 2024-02-25 DIAGNOSIS — I77810 Thoracic aortic ectasia: Secondary | ICD-10-CM

## 2024-02-25 DIAGNOSIS — I25118 Atherosclerotic heart disease of native coronary artery with other forms of angina pectoris: Secondary | ICD-10-CM

## 2024-02-25 DIAGNOSIS — D6869 Other thrombophilia: Secondary | ICD-10-CM | POA: Diagnosis not present

## 2024-02-25 DIAGNOSIS — I350 Nonrheumatic aortic (valve) stenosis: Secondary | ICD-10-CM

## 2024-02-25 DIAGNOSIS — R079 Chest pain, unspecified: Secondary | ICD-10-CM | POA: Diagnosis not present

## 2024-02-25 DIAGNOSIS — I7121 Aneurysm of the ascending aorta, without rupture: Secondary | ICD-10-CM

## 2024-02-25 DIAGNOSIS — I480 Paroxysmal atrial fibrillation: Secondary | ICD-10-CM

## 2024-02-25 NOTE — Patient Instructions (Addendum)
 Medication Instructions:   Your physician recommends that you continue on your current medications as directed. Please refer to the Current Medication list given to you today.   *If you need a refill on your cardiac medications before your next appointment, please call your pharmacy*  Lab Work:  None ordered.  If you have labs (blood work) drawn today and your tests are completely normal, you will receive your results only by: MyChart Message (if you have MyChart) OR A paper copy in the mail If you have any lab test that is abnormal or we need to change your treatment, we will call you to review the results.  Testing/Procedures:     Please report to Radiology at the [facility] Main Entrance 30 minutes early for your test.  [address] [place], [place] [address]  How to Prepare for Your Cardiac PET/CT Stress Test:  Nothing to eat or drink, except water, 3 hours prior to arrival time.  NO caffeine/decaffeinated products, or chocolate 12 hours prior to arrival. (Please note decaffeinated beverages (teas/coffees) still contain caffeine).  If you have caffeine within 12 hours prior, the test will need to be rescheduled.  Medication instructions: Do not take erectile dysfunction medications for 72 hours prior to test (sildenafil, tadalafil) Do not take tamsulosin  the day before or morning of test Do not take nitrates (isosorbide mononitrate, Ranexa) the day before or day of test  You may take your remaining medications with water.  NO cologne or lotion on chest or abdomen area.  Total time is 1 to 2 hours; you may want to bring reading material for the waiting time.  In preparation for your appointment, medication and supplies will be purchased.  Appointment availability is limited, so if you need to cancel or reschedule, please call the Radiology Department Scheduler at [phone] 24 hours in advance to avoid a cancellation fee of $100.00  What to Expect When you  Arrive:  Once you arrive and check in for your appointment, you will be taken to a preparation room within the Radiology Department.  A technologist or Nurse will obtain your medical history, verify that you are correctly prepped for the exam, and explain the procedure.  Afterwards, an IV will be started in your arm and electrodes will be placed on your skin for EKG monitoring during the stress portion of the exam. Then you will be escorted to the PET/CT scanner.  There, staff will get you positioned on the scanner and obtain a blood pressure and EKG.  During the exam, you will continue to be connected to the EKG and blood pressure machines.  A small, safe amount of a radioactive tracer will be injected in your IV to obtain a series of pictures of your heart along with an injection of a stress agent.    After your Exam:  It is recommended that you eat a meal and drink a caffeinated beverage to counter act any effects of the stress agent.  Drink plenty of fluids for the remainder of the day and urinate frequently for the first couple of hours after the exam.  Your doctor will inform you of your test results within 7-10 business days.  For more information and frequently asked questions, please visit our website: [email or web address]  For questions about your test or how to prepare for your test, please call: Cardiac Imaging Nurse Navigators Office: [phone]  For billing questions, please call [phone].    Follow-Up: At [facility], you and your  health needs are our priority.  As part of our continuing mission to provide you with exceptional heart care, our providers are all part of one team.  This team includes your primary Cardiologist (physician) and Advanced Practice Providers or APPs (Physician Assistants and Nurse Practitioners) who all work together to provide you with the care you need, when you need it.  Your next appointment:   3 month(s)  Provider:    Shelda Bruckner, MD    We recommend signing up for the patient portal called MyChart.  Sign up information is provided on this After Visit Summary.  MyChart is used to connect with patients for Virtual Visits (Telemedicine).  Patients are able to view lab/test results, encounter notes, upcoming appointments, etc.  Non-urgent messages can be sent to your provider as well.   To learn more about what you can do with MyChart, go to [email or web address].

## 2024-03-01 ENCOUNTER — Emergency Department (HOSPITAL_COMMUNITY)

## 2024-03-01 ENCOUNTER — Encounter (HOSPITAL_COMMUNITY): Payer: Self-pay

## 2024-03-01 ENCOUNTER — Other Ambulatory Visit: Payer: Self-pay

## 2024-03-01 ENCOUNTER — Emergency Department (HOSPITAL_COMMUNITY)
Admission: EM | Admit: 2024-03-01 | Discharge: 2024-03-01 | Disposition: A | Discharge status: Home / Self Care | Source: Home / Self Care | Attending: Emergency Medicine | Admitting: Emergency Medicine

## 2024-03-01 DIAGNOSIS — E785 Hyperlipidemia, unspecified: Secondary | ICD-10-CM | POA: Diagnosis present

## 2024-03-01 DIAGNOSIS — R079 Chest pain, unspecified: Secondary | ICD-10-CM

## 2024-03-01 DIAGNOSIS — I4819 Other persistent atrial fibrillation: Secondary | ICD-10-CM | POA: Diagnosis present

## 2024-03-01 DIAGNOSIS — I10 Essential (primary) hypertension: Secondary | ICD-10-CM | POA: Diagnosis present

## 2024-03-01 DIAGNOSIS — I2510 Atherosclerotic heart disease of native coronary artery without angina pectoris: Secondary | ICD-10-CM | POA: Diagnosis present

## 2024-03-01 DIAGNOSIS — R0789 Other chest pain: Secondary | ICD-10-CM | POA: Insufficient documentation

## 2024-03-01 LAB — BASIC METABOLIC PANEL WITH GFR
Anion gap: 10 (ref 5–15)
BUN: 25 mg/dL — ABNORMAL HIGH (ref 8–23)
CO2: 23 mmol/L (ref 22–32)
Calcium: 9.4 mg/dL (ref 8.9–10.3)
Chloride: 100 mmol/L (ref 98–111)
Creatinine, Ser: 1.12 mg/dL (ref 0.61–1.24)
GFR, Estimated: >60 mL/min
Glucose, Bld: 100 mg/dL — ABNORMAL HIGH (ref 70–99)
Potassium: 4.5 mmol/L (ref 3.5–5.1)
Sodium: 134 mmol/L — ABNORMAL LOW (ref 135–145)

## 2024-03-01 LAB — CBC
HCT: 36.5 % — ABNORMAL LOW (ref 39.0–52.0)
Hemoglobin: 12.3 g/dL — ABNORMAL LOW (ref 13.0–17.0)
MCH: 33 pg (ref 26.0–34.0)
MCHC: 33.7 g/dL (ref 30.0–36.0)
MCV: 97.9 fL (ref 80.0–100.0)
Platelets: 175 10*3/uL (ref 150–400)
RBC: 3.73 MIL/uL — ABNORMAL LOW (ref 4.22–5.81)
RDW: 13.1 % (ref 11.5–15.5)
WBC: 5.8 10*3/uL (ref 4.0–10.5)
nRBC: 0 % (ref 0.0–0.2)

## 2024-03-01 LAB — HEPATIC FUNCTION PANEL
ALT: 13 U/L (ref 0–44)
AST: 21 U/L (ref 15–41)
Albumin: 3.9 g/dL (ref 3.5–5.0)
Alkaline Phosphatase: 55 U/L (ref 38–126)
Bilirubin, Direct: 0.2 mg/dL (ref 0.0–0.2)
Indirect Bilirubin: 0.3 mg/dL (ref 0.3–0.9)
Total Bilirubin: 0.4 mg/dL (ref 0.0–1.2)
Total Protein: 6.3 g/dL — ABNORMAL LOW (ref 6.5–8.1)

## 2024-03-01 LAB — TROPONIN T, HIGH SENSITIVITY
Troponin T High Sensitivity: 40 ng/L — ABNORMAL HIGH (ref 0–19)
Troponin T High Sensitivity: 38 ng/L — ABNORMAL HIGH (ref 0–19)

## 2024-03-01 MED ORDER — ISOSORBIDE MONONITRATE ER 30 MG PO TB24: 30.0000 mg | ORAL_TABLET | Freq: Every day | ORAL | 0 refills | Status: AC

## 2024-03-01 NOTE — Telephone Encounter (Signed)
 Went to place a call back to the pt to follow-up on mychart message he sent to Dr. Lonni on 2/1.   While looking in pts chart to place the call, noted he is currently in the ER now for chest pain complaints.   Per ER triage RN, pt reported to ER this morning for left sided chest pain that started after waking up.  Noted mentioned chest pain comes and goes and 1 nitroglycerin  taken PTA.   Will close this encounter and route this to Dr. Lonni and RN, to make them aware pt is currently in the ER now for evaluation of chest pain complaints.

## 2024-03-01 NOTE — Discharge Instructions (Signed)
 Thank you for letting us  take care of you today.  You came in today for evaluation of chest pain.  We did blood work that was overall reassuring.  You did have slightly elevated heart enzymes and we spoke with the heart doctors.  They will follow-up with you closely in the clinic and they will also move up your stress test.  They will also start you on a new medication called Imdur .  Please take this as prescribed.  Please come back to the emergency department you have persistent or worsening symptoms.

## 2024-03-01 NOTE — ED Notes (Signed)
 Pt ambulated to restroom and back with no complaints from pt.

## 2024-03-01 NOTE — ED Triage Notes (Signed)
 Pt reports left sided chest pain that started this morning after waking up. Pt reports the pain comes and goes. 1 nitroglycerin  taken PTA. Denies chest pain at this time.

## 2024-03-03 NOTE — Progress Notes (Unsigned)
 " Cardiology Office Note   Date:  03/04/2024  ID:  Randall Chen, DOB September 17, 1932, MRN [id number] PCP: Randall Trula SQUIBB, MD  [facility] Providers Cardiologist:  Randall Bruckner, MD Electrophysiologist:  Randall Birmingham, MD     [name] Hypertension Hyperlipidemia CAD Inferior MI 2003 s/p PCI of RCA PCI to LCx/OM 2005 PAF on chronic anticoagulation Bicuspid aortic valve Aortic stenosis OSA on CPAP Thoracic aortic dilatation  Previously followed by Dr. Shlomo, he established care with Dr. Bruckner on 10/21/2023.  He had symptomatic hypotension July 2025 with BP 84/42.  Since cutting his ARB dose in half, BP range has been 96/43 - 148/68 with average around 120/60.  He is asymptomatic.  He has been maintained on dofetilide  for management of A-fib.  Only 1 episode of a fib in the previous 3 to 4 years which occurred over the summer and lasted for about a week.  He has a Optician[employer] mobile and he checks it every day.  Echo 10/08/23 revealed LVEF 55 to 60%, no RWMA, mild LVH, G1 DD, mildly enlarged RV, mild aortic valve stenosis with mean gradient 11 mmHg, mild dilatation of the aortic root measuring 44 mm, mild dilatation of the ascending aorta measuring 40 mm.  CT measurements in the past have been better than echo measurements.  He was advised to continue irbesartan  150 mg daily.  He requested to follow-up with Dr. Bruckner for management of A-fib given Dr. Iven upcoming retirement.  He called the office on 02/24/2024 to report 2 episodes of chest pain on that day and the day prior.  He took 1 nitroglycerin  tablet in the pain resolved.  He was exercising at the time.  ER precautions were advised and he was scheduled for an in office appointment.  Seen by me on 02/25/24 for evaluation of chest pain. He developed a burning chest discomfort across his chest while shoveling ice and carrying heavy buckets of hot water. Nitroglycerin  relieved the pain. The next day, the same activity caused  similar pain. It resolved after two nitroglycerin  doses, but he feels one dose was not fully absorbed. No chest pain since. No nausea, shortness of breath, or lightheadedness with the pain, though he did feel lightheaded after taking nitroglycerin . He attends vigorous water exercise classes three times weekly without symptoms.  He does not get short of breath or have any chest discomfort or lightheadedness with general activity.  History of coronary artery disease with stents placed in 2003 and 2005. BP is generally well controlled. Checks his rhythm on Kardia monitor at home weekly and reports sinus rhythm, no new episodes of a fib. He reports a stable aneurysm last measured at 44 mm, a longstanding heart murmur, aortic calcification and plaque, and well-controlled cholesterol with LDL of 48. Plan to pursue cardiac PET CT for evaluation of ischemia.  ED visit 03/01/24 for chest pain.  EKG with no acute ischemic changes, high-sensitivity troponin minimally elevated and flat at 40 >> 38, chest x-ray with no acute findings.  He awakened that morning with intermittent left upper sided chest pain described as tightness that was intermittent for a couple of minutes at the time.  It did not worsen with position or exertion.  Seen by Randall Chen in ED.  He was chest pain-free at this time.  BP was elevated.  He reported BP at home was 90s/40s the night prior.  He was advised to start Imdur  30 mg daily and continue plan for PET/CT.  History of Present Illness  Discussed the use of AI scribe software for clinical note transcription with the patient, who gave verbal consent to proceed.  History of Present Illness Randall Chen is a very pleasant 89 year old man who is here today for follow-up of chest pain.  He is accompanied by his wife. Is experiencing some low blood pressure readings and asks if trazodone could be contributing.  He has been taking this medication for a couple of years and did not take it last night.   He reported he had very poor sleep. One morning BP recently was 84 mmHg upon waking.  BP came up within a few hours.  He had taken trazodone and Imdur  at bedtime.  He did not feel lightheaded or dizzy.  He has had no episodes of significant dizziness or lightheadedness. He monitors his blood pressure at home and has ordered a new machine because he is concerned the current one may be inaccurate.  He is trying to discontinue use of trazodone but has not slept well for many years. He is usually active with gardening and water exercise. His wife notes he sometimes overexerts himself. He agrees that he may push himself too hard and is willing to adjust his activity level.  He denies chest pain presently.  No dyspnea, orthopnea, PND, edema, palpitations, presyncope, syncope.  ROS: + chest pain  Studies Reviewed       No results found for: LIPOA  Risk Assessment/Calculations  CHA2DS2-VASc Score = 4   This indicates a 4.8% annual risk of stroke. The patient's score is based upon: CHF History: 0 HTN History: 1 Diabetes History: 0 Stroke History: 0 Vascular Disease History: 1 Age Score: 2 Gender Score: 0            Physical Exam VS:  BP (!) 98/48 (BP Location: Left Arm, Patient Position: Sitting, Cuff Size: Normal)   Pulse (!) 53   Ht 6' 1 (1.854 m)   Wt 238 lb (108 kg)   SpO2 96%   BMI 31.40 kg/m    Wt Readings from Last 3 Encounters:  03/04/24 238 lb (108 kg)  03/01/24 232 lb (105.2 kg)  02/25/24 238 lb (108 kg)    GEN: Well nourished, well developed in no acute distress NECK: No JVD; No carotid bruits CARDIAC: RRR, no murmurs, rubs, gallops RESPIRATORY:  Clear to auscultation without rales, wheezing or rhonchi  ABDOMEN: Soft, non-tender, non-distended EXTREMITIES:  No edema; No deformity   Assessment & Plan CAD Chest pain   History of two prior coronary stents in 2003 and 2005.  Seen by me on 02/25/2024 for recent development of chest pain while clearing ice and cold  weather, pain relieved by SL NTG.  We planned for cardiac PET/CT for evaluation of ischemia.  ED visit 03/01/2024 with chest pain that started when he awoke that morning.  Tried nitroglycerin  but was not sure if it helped. Troponins were mildly elevated but flat. CXR with no acute processes. No acute changes on EKG. Imdur  30 mg daily was added for management of angina.  He is tolerating this well but continues to have some episodes of hypotension for which he is asymptomatic.  He is not on aspirin given need for [facility]. - Cardiac PET CT for ischemia evaluation- currently scheduled for 03/30/24 - Continue Imdur , ezetimibe , rosuvastatin , metoprolol  - Use SL nitroglycerin  as needed - Notify us  if symptoms worsen - Advised to avoid strenuous activities in cold weather and warm up slowly before exertion  Orthostatic hypotension  Episodes of low blood pressure may be secondary to current medication regimen. He is attempted to discontinue trazodone to see if BP improves. No significant dizziness, presyncope or syncope.  - Continue to routinely monitor home BP  - May liberalize salt with hypotension - Report consistent BP < 90 mmHg systolic - Consider adjusting irbesartan  dosage if hypotension persists.  PAF on chronic anticoagulation Atrial fibrillation is well controlled with current medications. HR is well controlled. No bleeding complications.  - Continue Tikosyn  250 mg twice daily for rhythm control - Continue metoprolol  25 mg daily for rate control - Continue regular home EKG monitoring - Continue Eliquis  5 mg twice daily which is appropriate dose for stroke prevention for CHA2DS2-VASc score of 4  Thoracic aortic aneurysm CTA aorta 03/2020 with greatest diameter of ascending aorta 3.5 cm, per radiologist negative for aortic aneurysm. Echo 10/08/2023 revealed mild dilatation of the aortic root measuring 44 mm, mild dilatation of the ascending aorta measuring 40 mm.  BP is well controlled. No acute  concerns today. - Continue annual imaging  Heart murmur Aortic stenosis Longstanding heart murmur . Echo 10/08/2023 reveals moderate calcification of the aortic valve, mild aortic stenosis with mean gradient 11 mmHg, normal LVEF. He has some mild chest pain but no consistent chest pain, dyspnea, presyncope, or syncope with exertion. - Continue to monitor clinically for now       Dispo: Keep appointment in April with Dr. Lonni  Signed, Rosaline Bane, NP-C "

## 2024-03-04 ENCOUNTER — Encounter (HOSPITAL_BASED_OUTPATIENT_CLINIC_OR_DEPARTMENT_OTHER): Payer: Self-pay | Admitting: Nurse Practitioner

## 2024-03-04 ENCOUNTER — Ambulatory Visit (INDEPENDENT_AMBULATORY_CARE_PROVIDER_SITE_OTHER): Admitting: Nurse Practitioner

## 2024-03-04 VITALS — BP 98/48 | HR 53 | Ht 73.0 in | Wt 238.0 lb

## 2024-03-04 DIAGNOSIS — I951 Orthostatic hypotension: Secondary | ICD-10-CM | POA: Diagnosis not present

## 2024-03-04 DIAGNOSIS — I350 Nonrheumatic aortic (valve) stenosis: Secondary | ICD-10-CM | POA: Diagnosis not present

## 2024-03-04 DIAGNOSIS — D6869 Other thrombophilia: Secondary | ICD-10-CM

## 2024-03-04 DIAGNOSIS — I480 Paroxysmal atrial fibrillation: Secondary | ICD-10-CM | POA: Diagnosis not present

## 2024-03-04 DIAGNOSIS — I77810 Thoracic aortic ectasia: Secondary | ICD-10-CM

## 2024-03-04 DIAGNOSIS — I25118 Atherosclerotic heart disease of native coronary artery with other forms of angina pectoris: Secondary | ICD-10-CM

## 2024-03-04 NOTE — Patient Instructions (Signed)
 Medication Instructions:   Your physician recommends that you continue on your current medications as directed. Please refer to the Current Medication list given to you today.   *If you need a refill on your cardiac medications before your next appointment, please call your pharmacy*  Lab Work:  None ordered.  If you have labs (blood work) drawn today and your tests are completely normal, you will receive your results only by: MyChart Message (if you have MyChart) OR A paper copy in the mail If you have any lab test that is abnormal or we need to change your treatment, we will call you to review the results.  Testing/Procedures:  None ordered.  I sent the PET team a message to place you on a cancellation list to get moved up.  Follow-Up: At [facility], you and your health needs are our priority.  As part of our continuing mission to provide you with exceptional heart care, our providers are all part of one team.  This team includes your primary Cardiologist (physician) and Advanced Practice Providers or APPs (Physician Assistants and Nurse Practitioners) who all work together to provide you with the care you need, when you need it.  Your next appointment:   2 month(s)  Provider:   Shelda Bruckner, MD    We recommend signing up for the patient portal called MyChart.  Sign up information is provided on this After Visit Summary.  MyChart is used to connect with patients for Virtual Visits (Telemedicine).  Patients are able to view lab/test results, encounter notes, upcoming appointments, etc.  Non-urgent messages can be sent to your provider as well.   To learn more about what you can do with MyChart, go to [email or web address].

## 2024-03-30 ENCOUNTER — Other Ambulatory Visit (HOSPITAL_COMMUNITY)

## 2024-05-27 ENCOUNTER — Ambulatory Visit (HOSPITAL_BASED_OUTPATIENT_CLINIC_OR_DEPARTMENT_OTHER): Admitting: Cardiology
